# Patient Record
Sex: Female | Born: 1990 | Race: White | Hispanic: No | Marital: Single | Smoking: Former smoker
Health system: Southern US, Community
[De-identification: ages and names within clinical notes are randomized; demographics above are authoritative.]

## PROBLEM LIST (undated history)

## (undated) DIAGNOSIS — E162 Hypoglycemia, unspecified: Secondary | ICD-10-CM

## (undated) DIAGNOSIS — J302 Other seasonal allergic rhinitis: Secondary | ICD-10-CM

## (undated) DIAGNOSIS — L709 Acne, unspecified: Secondary | ICD-10-CM

## (undated) DIAGNOSIS — S73199A Other sprain of unspecified hip, initial encounter: Secondary | ICD-10-CM

## (undated) HISTORY — DX: Other seasonal allergic rhinitis: J30.2

## (undated) HISTORY — DX: Acne, unspecified: L70.9

## (undated) HISTORY — PX: WISDOM TOOTH EXTRACTION: SHX21

---
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
## 2011-06-30 ENCOUNTER — Ambulatory Visit (INDEPENDENT_AMBULATORY_CARE_PROVIDER_SITE_OTHER): Payer: 59 | Admitting: Family Medicine

## 2011-06-30 ENCOUNTER — Encounter: Payer: Self-pay | Admitting: Family Medicine

## 2011-06-30 VITALS — BP 108/70 | HR 80 | Temp 98.1°F | Ht 66.5 in | Wt 165.0 lb

## 2011-06-30 DIAGNOSIS — J019 Acute sinusitis, unspecified: Secondary | ICD-10-CM

## 2011-06-30 DIAGNOSIS — J309 Allergic rhinitis, unspecified: Secondary | ICD-10-CM

## 2011-06-30 MED ORDER — FLUTICASONE PROPIONATE 50 MCG/ACT NA SUSP: 2.0000 | Freq: Every day | NASAL | Status: DC

## 2011-06-30 MED ORDER — AMOXICILLIN-POT CLAVULANATE 875-125 MG PO TABS: 1.0000 | ORAL_TABLET | Freq: Two times a day (BID) | ORAL | Status: AC

## 2011-06-30 NOTE — Progress Notes (Signed)
Chief Complaint  Patient presents with  . Sinus problems    sinus problems since febuary and seems fine if shes on meds but once done comes right back   HPI: She has been sick since early February.  Saw a doctor in [place] 3 times. Initially diagnosed with sinusitis, then URI, then had pneumonia (3-4 weeks ago).  Has had a steroid pack, took Mucinex, and antibiotics that began with L and C (??levaquin, clarithromycin--she thinks so).  Also treated with a hydrocodone syrup for cough.  Fevers resolved after 2 days, but cough has recurred.  Currently complaining of stuffy nose, pressure between her eyes and in both cheeks.  Chest congestion.  Nasal mucus is green initially, clears throughout the day (still slightly green).  Phlegm is thick, green/yellow.  Cough is worse when she first wakes up, and when she lays down to go to sleep.    Has an inhaler, hasn't used it--doesn't feel like the same chest tightness like with asthma.  Zyrtec has always worked well for her allergies in the past, or Zyrtec D if worse.  Hasn't had improvement with this regimen recently.  Past Medical History  Diagnosis Date  . Asthma   . Seasonal allergies   . Acne     Past Surgical History  Procedure Date  . Wisdom tooth extraction     History   Social History  . Marital Status: Single    Spouse Name: N/A    Number of Children: N/A  . Years of Education: N/A   Occupational History  . student    Social History Main Topics  . Smoking status: Never Smoker   . Smokeless tobacco: Never Used  . Alcohol Use: No  . Drug Use: No  . Sexually Active: Yes -- Female partner(s)    Birth Control/ Protection: Pill   Other Topics Concern  . 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 Hypertension Mother   . Heart disease Father 79    MI; s/p CABG  . Diabetes Father     resolved after weight loss  . Cancer Paternal  Grandmother     lung   Current Outpatient Prescriptions on File Prior to Visit  Medication Sig Dispense Refill  . cetirizine (ZYRTEC) 10 MG tablet Take 10 mg by mouth daily.      . drospirenone-ethinyl estradiol (YAZ,GIANVI,LORYNA) 3-0.02 MG tablet Take 1 tablet by mouth daily.      Marland Kitchen albuterol (PROVENTIL HFA;VENTOLIN HFA) 108 (90 BASE) MCG/ACT inhaler Inhale 2 puffs into the lungs every 6 (six) hours as needed.      . fluticasone (FLONASE) 50 MCG/ACT nasal spray Place 2 sprays into the nose daily.  16 g  6   No Known Allergies  ROS:  Denies fevers.  Denies headaches (just sinus pressure).  Denies nausea, vomiting, diarrhea, skin rashes. R shoulder pain (plans upcoming surgery).  Denies exertional chest pain or shortness of breath (just related to spells of coughing)  PHYSICAL EXAM: BP 108/70  Pulse 80  Temp(Src) 98.1 F (36.7 C) (Oral)  Ht 5' 6.5" (1.689 m)  Wt 165 lb (74.844 kg)  BMI 26.23 kg/m2 Well developed female, in no distress. Frequent sniffling, sounds congested.  No coughing during visit. HEENT:  PERRL, EOMI, conjunctiva clear.  TM's and EAC's normal. Nasal mucosa mild-mod edematous, no erythema, clear-white mucus.  Sinuses tender x 4.  OP with cobblestoning posteriorly Neck: no lymphadenopathy or mass Heart: regular rate and rhythm without murmur Lungs: clear no wheezing, no wheeze with forced expiration. Skin: no rash Psych: normal mood, affect, hygiene and grooming  ASSESSMENT/PLAN: 1. Allergic rhinitis, cause unspecified  fluticasone (FLONASE) 50 MCG/ACT nasal spray  2. Acute sinusitis  amoxicillin-clavulanate (AUGMENTIN) 875-125 MG per tablet   Allergies, +/- low grade sinus infection that has been inadequately treated (given recurrence of discoloration as soon as ABX are stopped)--most likely related to length of treatment.  Sinuses rinses--Neti-pot or sinus rinse kit (1-2x daily) Continue Zyrtec, decongestant (either separate sudafed, zyrtec D or mucinex  D) Add Mucinex (plain, D or DM--has cough suppressant in it) Use your inhaler when having spasms of dry cough Add Flonase--2 sprays each nostril once daily.  Gentle sniffs only; instructed on proper use  Try above measures for 2-3 days.  If mucus remains thick and discolored, and no improvement in your symptoms, then start the antibiotic.  Use the antibiotic for a full 3 weeks.  You will need to use condoms as your Yaz may not protect you from pregnancy while on antibiotic.  Discussed s/sx of yeast infection, and to use OTC Monistat if they develop (hasn't had in the past with ABX)  Return in 2 weeks if not better.  25 minute visit, >1/2 counseling

## 2011-06-30 NOTE — Patient Instructions (Signed)
Allergies, +/- low grade sinus infection that has been inadequately treated (given recurrence of discoloration as soon as antibiotics are stopped)--most likely related to length of treatment.  Sinuses rinses--Neti-pot or sinus rinse kit (1-2x daily) Continue Zyrtec, decongestant (either separate sudafed, zyrtec D or mucinex D) Add Mucinex (plain, D or DM--has cough suppressant in it) Use your inhaler when having spasms of dry cough Add Flonase--2 sprays each nostril once daily.  Gentle sniffs only  Try above measures for 2-3 days.  If mucus remains thick and discolored, and no improvement in your symptoms, then start the antibiotic.  Use the antibiotic for a full 3 weeks.  You will need to use condoms as your Yaz may not protect you from pregnancy while on antibiotic.    Return in 2 weeks if not better

## 2011-07-12 HISTORY — PX: ROTATOR CUFF REPAIR: SHX139

## 2011-09-09 ENCOUNTER — Other Ambulatory Visit: Payer: 59

## 2011-09-10 ENCOUNTER — Other Ambulatory Visit: Payer: 59

## 2011-09-10 DIAGNOSIS — Z130 Encounter for screening for diseases of the blood and blood-forming organs and certain disorders involving the immune mechanism: Secondary | ICD-10-CM

## 2011-09-11 LAB — SICKLE CELL SCREEN: Sickle Cell Screen: NEGATIVE

## 2012-09-06 ENCOUNTER — Encounter: Payer: Self-pay | Admitting: Family Medicine

## 2012-09-06 ENCOUNTER — Ambulatory Visit (INDEPENDENT_AMBULATORY_CARE_PROVIDER_SITE_OTHER): Payer: 59 | Admitting: Family Medicine

## 2012-09-06 VITALS — BP 108/60 | HR 84 | Temp 98.2°F | Ht 66.0 in | Wt 170.0 lb

## 2012-09-06 DIAGNOSIS — R300 Dysuria: Secondary | ICD-10-CM

## 2012-09-06 DIAGNOSIS — N390 Urinary tract infection, site not specified: Secondary | ICD-10-CM

## 2012-09-06 DIAGNOSIS — J309 Allergic rhinitis, unspecified: Secondary | ICD-10-CM

## 2012-09-06 DIAGNOSIS — J069 Acute upper respiratory infection, unspecified: Secondary | ICD-10-CM

## 2012-09-06 LAB — POCT URINALYSIS DIPSTICK
Bilirubin, UA: NEGATIVE
Glucose, UA: NEGATIVE
Ketones, UA: NEGATIVE
Nitrite, UA: NEGATIVE
Spec Grav, UA: 1.02
Urobilinogen, UA: NEGATIVE
pH, UA: 5

## 2012-09-06 MED ORDER — SULFAMETHOXAZOLE-TMP DS 800-160 MG PO TABS: 1.0000 | ORAL_TABLET | Freq: Two times a day (BID) | ORAL | Status: DC

## 2012-09-06 NOTE — Progress Notes (Signed)
Chief Complaint  Patient presents with  . Cough    and chest congestion, coughing up "green crud" x 1 week.   . Urinary Frequency    and urgency as well as burning since last night, worsened this morning.    She started having dysuria, urgency and frequency of small amounts, starting last night.  Denies fever, chills, back pain, nausea or vomiting.  H/o UTI's when much smaller, none recent.  Also complaining of chest congestion, and coughing up green phlegm.  Slight runny nose in the mornings, clear.  Denies sinus pain.  Some scratchy throat.  Denies ear pain.  Sometimes feels some rattling in her chest with breathing, but denies any chest tightness or wheezing. No shortness of breath.  Hasn't needed to use inhaler.  She has h/o allergies, uses zyrtec daily.  She thought it was just allergies until it moved into her chest, about 3 days ago.  Has been sick for a week in total.  Denies sick contacts.    Past Medical History  Diagnosis Date  . Asthma   . Seasonal allergies   . Acne    Past Medical History  Diagnosis Date  . Asthma   . Seasonal allergies   . Acne    History   Social History  . Marital Status: Single    Spouse Name: N/A    Number of Children: N/A  . Years of Education: N/A   Occupational History  . student    Social History Main Topics  . Smoking status: Never Smoker   . Smokeless tobacco: Never Used  . Alcohol Use: Yes     Comment: 3-5 drinks every other week.  . Drug Use: No  . Sexually Active: Yes -- Female partner(s)    Birth Control/ Protection: Pill   Other Topics Concern  . 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  Slight recent diarrhea.  No vaginal discharge, periods are normal on OCP's.  No rashes, bleeding, bruising.    PHYSICAL EXAM:  BP 108/60  Pulse 84  Temp(Src) 98.2 F (36.8 C) (Oral)  Ht 5' 6"  (1.676 m)  Wt 170 lb (77.111 kg)  BMI 27.45 kg/m2  LMP 08/31/2012 Well developed, pleasant female in no distress HEENT:  PERRL, EOMI, conjunctiva clear.  TM's and EAC's normal.  Nasal mucosa mildly edematous, no erythema,  Edema L>R with clear mucus.  Sinuses nontender.  OP clear Neck: no lymphadenopathy, thyromegaly or mass Heart: regular rate and rhythm without murmur Lungs: clear bilaterally Abdomen: nontender, no mass. No suprapubic tenderness Back: no CVA tenderness Skin: no rash Psych: normal mood, affect, hygiene and grooming Neuro: alert and oriented.  Cranial nerves intact. Normal strength, gait   Urine dip:  2+ blood, 2+ protein, 2+ leuks She is on her period; wearing tampon   ASSESSMENT/PLAN:  UTI (urinary tract infection) - treat with Septra DS;  no h/o allergy.  This should also cover her URI symptoms if bacterial - Plan: sulfamethoxazole-trimethoprim (BACTRIM DS) 800-160 MG per tablet, Urine culture  Burning with urination - Plan: POCT Urinalysis Dipstick  Acute upper respiratory infections of unspecified site - possible early sinusitis vs bronchitis in a pt with chronic allergies.  cover with Septra DS - Plan: sulfamethoxazole-trimethoprim (BACTRIM DS) 800-160 MG per tablet  Allergic rhinitis, cause unspecified - continue zyrtec   Drink plenty of fluids Start Septra DS BID x 7 days If ongoing respiratory symptoms after a week (improved, but persistent symptoms), then call to have antibiotics  extended) Try using guaifenesin (mucinex or robitussin)--this is an expectorant that will keep the phlegm loose/thin.  Consider DM version (dextromethorphan) of robitussin or mucinex, vs separate delsym syrup if having significant cough--this is a cough suppressant.  Return if having fevers, vomiting, flank pain, or worsening cough with shortness of breath or other problems.  Make sure to use back-up contraception (ie condoms) as the antibiotics lessen the effectiveness of birth control pills

## 2012-09-06 NOTE — Patient Instructions (Addendum)
Drink plenty of fluids Start Septra DS BID x 7 days If ongoing respiratory symptoms after a week (improved, but persistent symptoms), then call to have antibiotics extended) Try using guaifenesin (mucinex or robitussin)--this is an expectorant that will keep the phlegm loose/thin.  Consider DM version (dextromethorphan) of robitussin or mucinex, vs separate delsym syrup if having significant cough--this is a cough suppressant.  Return if having fevers, vomiting, flank pain, or worsening cough with shortness of breath or other problems.  Make sure to use back-up contraception (ie condoms) as the antibiotics lessen the effectiveness of birth control pills  Call in 2-3 days if urinary symptoms aren't significantly improved. Call in 1 week if ongoing respiratory symptoms

## 2012-10-13 ENCOUNTER — Telehealth: Payer: Self-pay | Admitting: Internal Medicine

## 2012-10-13 DIAGNOSIS — J45909 Unspecified asthma, uncomplicated: Secondary | ICD-10-CM

## 2012-10-13 MED ORDER — ALBUTEROL SULFATE HFA 108 (90 BASE) MCG/ACT IN AERS: 2.0000 | INHALATION_SPRAY | Freq: Four times a day (QID) | RESPIRATORY_TRACT | Status: DC | PRN

## 2012-10-13 NOTE — Telephone Encounter (Signed)
done

## 2012-10-13 NOTE — Telephone Encounter (Signed)
This was sent to me. Is this okay to refill?

## 2012-10-13 NOTE — Telephone Encounter (Signed)
[facility]

## 2013-03-25 ENCOUNTER — Other Ambulatory Visit: Payer: Self-pay | Admitting: Family Medicine

## 2013-03-25 NOTE — Telephone Encounter (Signed)
Deny. She has only been seen for acute visits.  She needs OV (and likely CPE at some point) for asthma and allergies, for refills

## 2013-03-25 NOTE — Telephone Encounter (Signed)
Is this okay to refill? She was last seen 7/ 2014. CLS

## 2013-04-01 NOTE — Telephone Encounter (Signed)
I called the patient and left her a voicemail about setting up an appointment in order to receive refills on her medication per Dr. Tomi Bamberger. CLS

## 2013-04-04 ENCOUNTER — Other Ambulatory Visit: Payer: Self-pay | Admitting: Family Medicine

## 2013-04-04 DIAGNOSIS — J45909 Unspecified asthma, uncomplicated: Secondary | ICD-10-CM

## 2013-04-04 NOTE — Telephone Encounter (Signed)
This was denied 2/13 and Andria Frames left message for her to set up appt--see prior refill request.  No OV is scheduled

## 2013-04-04 NOTE — Telephone Encounter (Signed)
Wants refill on albuterol inhaler   Sent to [facility]

## 2013-04-05 ENCOUNTER — Other Ambulatory Visit: Payer: Self-pay | Admitting: Family Medicine

## 2013-04-06 ENCOUNTER — Telehealth: Payer: Self-pay | Admitting: Family Medicine

## 2013-04-06 DIAGNOSIS — J45909 Unspecified asthma, uncomplicated: Secondary | ICD-10-CM

## 2013-04-06 MED ORDER — ALBUTEROL SULFATE HFA 108 (90 BASE) MCG/ACT IN AERS: 2.0000 | INHALATION_SPRAY | Freq: Four times a day (QID) | RESPIRATORY_TRACT | Status: DC | PRN

## 2013-04-06 NOTE — Telephone Encounter (Signed)
Ok for 1 albuterol, no add'l refill.  Schedule for either CPE or med check for May (CPE recommended, up to pt), when she is back in town

## 2013-04-06 NOTE — Telephone Encounter (Signed)
Left message for patient letting her know that Dr.Knapp ok'd for #1 on albuterol inhaler-sent rx to [facility] in [place], [place]. Asked patient to please call and schedule either a CPE or med check in May when she returns. Asked her to please call ASAP as appt's fill up quickly.

## 2013-04-06 NOTE — Telephone Encounter (Signed)
Pt called and stated that she received a phone call telling her she needs an appt. Pt states that she is out of state going to school and will not be back until May. 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 Please fill medication or advise pt. [facility] [place], [place]. Pt can be reached at [phone].

## 2013-04-06 NOTE — Telephone Encounter (Signed)
Called and left another message asking patient to call and schedule med check for asthma and allergies with Dr.Knapp. Andria Frames already did this on 04/01/13.

## 2013-06-30 ENCOUNTER — Encounter: Payer: 59 | Admitting: Family Medicine

## 2013-07-13 ENCOUNTER — Ambulatory Visit (INDEPENDENT_AMBULATORY_CARE_PROVIDER_SITE_OTHER): Payer: 59 | Admitting: Family Medicine

## 2013-07-13 ENCOUNTER — Encounter: Payer: Self-pay | Admitting: Family Medicine

## 2013-07-13 VITALS — BP 110/60 | HR 68 | Temp 98.5°F | Ht 66.0 in | Wt 175.0 lb

## 2013-07-13 DIAGNOSIS — J45990 Exercise induced bronchospasm: Secondary | ICD-10-CM

## 2013-07-13 DIAGNOSIS — J45909 Unspecified asthma, uncomplicated: Secondary | ICD-10-CM

## 2013-07-13 DIAGNOSIS — J069 Acute upper respiratory infection, unspecified: Secondary | ICD-10-CM

## 2013-07-13 DIAGNOSIS — J309 Allergic rhinitis, unspecified: Secondary | ICD-10-CM

## 2013-07-13 MED ORDER — ALBUTEROL SULFATE HFA 108 (90 BASE) MCG/ACT IN AERS: 2.0000 | INHALATION_SPRAY | Freq: Four times a day (QID) | RESPIRATORY_TRACT | Status: DC | PRN

## 2013-07-13 NOTE — Patient Instructions (Addendum)
We discussed singulair as an option to treat allergies/asthma--recommended if you are going to be getting very regular physical activity that requires use of preventative albuterol, given that you also have year-round allergies.  Let me know if you would like to try this and we can send in prescription.  You would need to take this medication every day.  Please try and get [place] copies of your immunization records, so that we know when you are due for your next tetanus, as well as any other recommended vaccines.   Yearly flu shots are recommended.  Drink plenty of fluids. Use guaifenesin (expectorant, found in Mucinex and robitussin). If you cough is more frequent, you can use the DM version of these medications--dextromethorphan is a cough suppressant.  This is also found separately in Delsym syrup.

## 2013-07-13 NOTE — Progress Notes (Signed)
Chief Complaint  Patient presents with  . Asthma    med check.   . Cough    cough that started yesterday, coughing up yellowish/greenish mucus.    Yesterday she woke up with cough, and today the cough is productive of yellow-green "chunks".  Denies any runny nose, sneezing (not different than her typical allergies), sinus pain/pressure, headaches, fever.  No sick contacts.  She returned from [place] on Monday (plane travel). Denies shortness of breath, wheezing.  Denies ear pain.  Asthma:  She used albuterol prior to playing softball, which worked well.  Didn't need to use rescue inhaler if she used it prior to exercise.  Only needs rescue inhaler related to running or hiking.  She used it over the weekend while hiking in [place].  Doesn't usually need it related to illnesses.  Allergies:  Well controlled with Zyrtec, although still has some bad days.  Symptoms are worse in the Spring, but usually has some allergies all year long; takes zyrtec daily all year.  No immunizations are on file.  Hasn't been getting flu shots (just once, and that was the year she got the flu). She has had Gardisil series, and sees a GYN yearly, scheduled for next week.  Past Medical History  Diagnosis Date  . Asthma   . Seasonal allergies   . Acne    Past Surgical History  Procedure Laterality Date  . Wisdom tooth extraction    . Rotator cuff repair Right 07/2011    as also bicep tendon surgery   History   Social History  . Marital Status: Single    Spouse Name: N/A    Number of Children: N/A  . Years of Education: N/A   Occupational History  . Not on file.   Social History Main Topics  . Smoking status: Never Smoker   . Smokeless tobacco: Never Used  . Alcohol Use: Yes     Comment: 3-5 drinks every other week  . Drug Use: No  . Sexual Activity: Yes    Partners: Male    Birth Control/ Protection: Pill   Other Topics Concern  . Not on file   Social History Narrative   Graduated 07/2013 from  [employer] in [place].  Hoping to go to [employer] in the Fall. Just got engaged   Outpatient Encounter Prescriptions as of 07/13/2013  Medication Sig Note  . cetirizine (ZYRTEC) 10 MG tablet Take 10 mg by mouth daily.   . drospirenone-ethinyl estradiol (YAZ,GIANVI,LORYNA) 3-0.02 MG tablet Take 1 tablet by mouth daily.   . Multiple Vitamins-Minerals (MULTIVITAMIN WITH MINERALS) tablet Take 1 tablet by mouth daily.   Marland Kitchen albuterol (PROVENTIL HFA;VENTOLIN HFA) 108 (90 BASE) MCG/ACT inhaler Inhale 2 puffs into the lungs every 6 (six) hours as needed for wheezing. 07/13/2013: Uses before playing softball (no longer doing); uses for rescue prn hiking/running, not with illnesses.   . [DISCONTINUED] sulfamethoxazole-trimethoprim (BACTRIM DS) 800-160 MG per tablet Take 1 tablet by mouth 2 (two) times daily.    No Known Allergies  ROS:  Denies fevers, chills, headaches, dizziness, chest pain, palpitations, shortness of breath, nausea, vomiting, bowel changes, bleeding/bruising, rash, joint pains or other complaints.  See HPI. + cough  PHYSICAL EXAM: BP 110/60  Pulse 68  Temp(Src) 98.5 F (36.9 C) (Oral)  Ht 5' 6"  (1.676 m)  Wt 175 lb (79.379 kg)  BMI 28.26 kg/m2  LMP 07/12/2013 Well developed, pleasant female in no distress HEENT:  PERRL, EOMI, conjunctiva clear. Nasal mucosa is  mild-moderately edematous, no erythema.  Clear mucus on right, white on left. Sinuses are nontender.  OP is clear Neck: no lymphadenopathy, thyromegaly or mass Heart: regular rate and rhythm, no murmur Lungs: clear bilaterally; good air movement. No wheezes, rales, ronchi Peak flow 450/485/470 (estimated goal is 460 based on height/age) Extremities: no edema Skin: no rash/lesions Abdomen: soft, nontender, no mass  ASSESSMENT/PLAN:  Asthma - Plan: albuterol (PROVENTIL HFA;VENTOLIN HFA) 108 (90 BASE) MCG/ACT inhaler  Exercise-induced asthma  Allergic rhinitis, cause unspecified  Acute upper respiratory  infections of unspecified site - supportive measures reviewed.  suspect viral URI   URI-- Drink plenty of fluids. Use guaifenesin (expectorant, found in Mucinex and robitussin). If you cough is more frequent, you can use the DM version of these medications--dextromethorphan is a cough suppressant.  This is also found separately in Delsym syrup.  Asthma--exercise induced.  Reviewed use of inhaler prior to exercise (and prn).  Discussed singulair as an option to treat allergies/asthma--recommended if she is going to be getting very regular physical activity that requires use of preventative albuterol most days, given that she also has year-round allergies that are suboptimally controlled in the Spring. She elects to hold off on Singulair at this time (as she is doing fine now, except for the URI, and NOT having any wheezing now)  She was advised to try and get [place] copies of immunization records, ?if due for Tdap. Yearly flu shots are recommended

## 2014-12-27 ENCOUNTER — Ambulatory Visit (INDEPENDENT_AMBULATORY_CARE_PROVIDER_SITE_OTHER): Payer: BLUE CROSS/BLUE SHIELD | Admitting: Family Medicine

## 2014-12-27 ENCOUNTER — Encounter: Payer: Self-pay | Admitting: Family Medicine

## 2014-12-27 VITALS — BP 110/70 | HR 64 | Temp 98.0°F | Wt 187.8 lb

## 2014-12-27 DIAGNOSIS — H5712 Ocular pain, left eye: Secondary | ICD-10-CM

## 2014-12-27 DIAGNOSIS — H578 Other specified disorders of eye and adnexa: Secondary | ICD-10-CM

## 2014-12-27 DIAGNOSIS — H5789 Other specified disorders of eye and adnexa: Secondary | ICD-10-CM

## 2014-12-27 NOTE — Progress Notes (Signed)
   Subjective:    Patient ID: Paula Cooley, female    DOB: 1990-05-01, 24 y.o.   MRN: [id number]  HPI Chief Complaint  Patient presents with  . eye swollen    eye swollen, red- blurry but feels better to keep it shut. painful but also painful to put pressure or touch. feels like something is in her eye. irriated and burning   She is here with a 2 day history of left eye pain, redness, cloudy vision, photophobia, and tenderness. Also reports sensation of foreign body. Denies injury. No known foreign body.  Denies fever, chills.  [facility] [place]- he is Optomotrist. Does not have an ophthalmologist.  Wears contact lenses, disposable. States she took her lens out yesterday.   Reviewed allergies, medications, past medical and social history  Review of Systems Pertinent positives and negatives in the history of present illness.    Objective:   Physical Exam  Constitutional: She appears well-developed and well-nourished. She appears distressed.  Eyes: EOM are normal. Pupils are equal, round, and reactive to light. Left eye exhibits discharge.  Lids swollen, conjunctiva red, difficult to assess due to severe photophobia and tenderness. Clear drainage present. Unable to perform visual acuity.    BP 110/70 mmHg  Pulse 64  Temp(Src) 98 F (36.7 C) (Oral)  Wt 187 lb 12.8 oz (85.186 kg)     Assessment & Plan:  Eye pain, left - Plan: Ambulatory referral to Ophthalmology  Redness of eye, left - Plan: Ambulatory referral to Ophthalmology  Possible iritis. Difficult to assess patient due to sensitivity to light and tenderness. Dr. Redmond School also examined patient and agreed that referral to ophthalmologist for further evaluation and treatment would be best. No obvious injury or foreign body

## 2015-08-11 LAB — HM PAP SMEAR: HM Pap smear: HIGH

## 2015-09-07 ENCOUNTER — Encounter: Payer: Self-pay | Admitting: Family Medicine

## 2015-09-26 ENCOUNTER — Encounter: Payer: Self-pay | Admitting: *Deleted

## 2015-10-29 ENCOUNTER — Other Ambulatory Visit: Payer: Self-pay | Admitting: Orthopedic Surgery

## 2015-10-29 DIAGNOSIS — M25511 Pain in right shoulder: Secondary | ICD-10-CM

## 2015-11-07 ENCOUNTER — Other Ambulatory Visit: Payer: Self-pay

## 2015-11-19 ENCOUNTER — Ambulatory Visit (INDEPENDENT_AMBULATORY_CARE_PROVIDER_SITE_OTHER): Payer: BLUE CROSS/BLUE SHIELD | Admitting: Orthopedic Surgery

## 2015-11-19 DIAGNOSIS — M25511 Pain in right shoulder: Secondary | ICD-10-CM

## 2015-11-21 ENCOUNTER — Other Ambulatory Visit: Payer: Self-pay | Admitting: Orthopedic Surgery

## 2015-11-26 ENCOUNTER — Ambulatory Visit (INDEPENDENT_AMBULATORY_CARE_PROVIDER_SITE_OTHER): Payer: BLUE CROSS/BLUE SHIELD | Admitting: Orthopedic Surgery

## 2015-12-12 ENCOUNTER — Inpatient Hospital Stay (HOSPITAL_COMMUNITY): Admission: RE | Admit: 2015-12-12 | Payer: BLUE CROSS/BLUE SHIELD | Source: Ambulatory Visit

## 2015-12-25 NOTE — Pre-Procedure Instructions (Signed)
    SUSEN HASKEW  12/25/2015      [id number] [facility]Phone: [phone]: [phone]   Your procedure is scheduled on Tuesday, November 21.  [facility]                 Your surgery or procedure is scheduled for 1:30 AM                Call this number if you have problems the morning of surgery: [phone]                For any other questions, please call [phone], Monday - Friday 8 AM - 4 PM.    Remember:  Do not eat food or drink liquids after midnight Monday, November 20.  Take these medicines the morning of surgery with A SIP OF WATER: cetirizine (ZYRTEC), Norgestimate-Ethinyl Estradiol Triphasic (TRI-SPRINTEC).                Stop taking Aspirin, Aspirin Products, Effient and herbal medications, Vitamins.  Do not take any NSAIDS ie:  Ibuprofen, Advil, Naproxen.   Do not wear jewelry, make-up or nail polish.   Do not wear lotions, powders, or perfumes, or deodorant.   Do not shave 48 hours prior to surgery.     Do not bring valuables to the hospital.   [facility] responsible for any belongings or valuables.  Contacts, dentures or bridgework may not be worn into surgery.  Leave your suitcase in the car.  After surgery it may be brought to your room.  For patients admitted to the hospital, discharge time will be determined by your treatment team.  Patients discharged the day of surgery will not be allowed to drive home.   Special instructions: -  Please read over the following fact sheets that you were given.

## 2015-12-26 ENCOUNTER — Inpatient Hospital Stay (INDEPENDENT_AMBULATORY_CARE_PROVIDER_SITE_OTHER): Payer: BLUE CROSS/BLUE SHIELD | Admitting: Orthopedic Surgery

## 2015-12-26 ENCOUNTER — Encounter (HOSPITAL_COMMUNITY)
Admission: RE | Admit: 2015-12-26 | Discharge: 2015-12-26 | Disposition: A | Discharge status: Home / Self Care | Payer: BLUE CROSS/BLUE SHIELD | Source: Ambulatory Visit | Attending: Orthopedic Surgery | Admitting: Orthopedic Surgery

## 2015-12-26 ENCOUNTER — Encounter (HOSPITAL_COMMUNITY): Payer: Self-pay

## 2015-12-26 DIAGNOSIS — Z01812 Encounter for preprocedural laboratory examination: Secondary | ICD-10-CM | POA: Insufficient documentation

## 2015-12-26 HISTORY — DX: Other sprain of unspecified hip, initial encounter: S73.199A

## 2015-12-26 HISTORY — DX: Hypoglycemia, unspecified: E16.2

## 2015-12-26 LAB — CBC
HEMATOCRIT: 40.9 % (ref 36.0–46.0)
HEMOGLOBIN: 14 g/dL (ref 12.0–15.0)
MCH: 31 pg (ref 26.0–34.0)
MCHC: 34.2 g/dL (ref 30.0–36.0)
MCV: 90.7 fL (ref 78.0–100.0)
Platelets: 244 10*3/uL (ref 150–400)
RBC: 4.51 MIL/uL (ref 3.87–5.11)
RDW: 12.7 % (ref 11.5–15.5)
WBC: 6.6 10*3/uL (ref 4.0–10.5)

## 2015-12-26 LAB — HCG, SERUM, QUALITATIVE: PREG SERUM: NEGATIVE

## 2015-12-26 LAB — BASIC METABOLIC PANEL
ANION GAP: 12 (ref 5–15)
BUN: 7 mg/dL (ref 6–20)
CALCIUM: 9.5 mg/dL (ref 8.9–10.3)
CO2: 20 mmol/L — ABNORMAL LOW (ref 22–32)
Chloride: 107 mmol/L (ref 101–111)
Creatinine, Ser: 0.84 mg/dL (ref 0.44–1.00)
Glucose, Bld: 98 mg/dL (ref 65–99)
POTASSIUM: 3.6 mmol/L (ref 3.5–5.1)
SODIUM: 139 mmol/L (ref 135–145)

## 2015-12-26 NOTE — Progress Notes (Signed)
Pt. Was started on Phentermine several months ago by Dr. Stann Mainland with [facility], , dropped from 201 lbs. To 171lbs. Today.  Pt. Told to stop Phentermine now. Pt. Denies chest concerns.

## 2015-12-26 NOTE — Pre-Procedure Instructions (Signed)
Paula Cooley  12/26/2015      [id number]Phone: [phone]: [phone]   Your procedure is scheduled on 01/01/2016  [facility]  Call this number if you have problems the morning of surgery:  [phone]   Remember:  Do not eat food or drink liquids after midnight.  On Monday night   Take these medicines the morning of surgery with A SIP OF WATER : birthcontrol pill, zyrtec & may take 2 glucose tabs if needed, also take plain Tylenol if needed  STOP NOW:  PHENTERMINE & ALEVE    Do not wear jewelry, make-up or nail polish.  Do not wear lotions, powders, or perfumes, or deoderant.  Do not shave 48 hours prior to surgery.    Do not bring valuables to the hospital.  [facility] any belongings or [facility]uables.  Contacts, dentures or bridgework may not be worn into surgery.  Leave your suitcase in the car.  After surgery it may be brought to your room.  For patients admitted to the hospital, discharge time will be determined by your treatment team.  Patients discharged the day of surgery will not be allowed to drive home.   Name and phone number of your driver:   Friend  Special instructions: Special Instructions: [facility]- Preparing for Surgery  Before surgery, you can play an important role.  Because skin is not sterile, your skin needs to be as free of germs as possible.  You can reduce the number of germs on you skin by washing with CHG (chlorahexidine gluconate) soap before surgery.  CHG is an antiseptic cleaner which kills germs and bonds with the skin to continue killing germs even after washing.  Please DO NOT use if you have an allergy to CHG or antibacterial soaps.  If your skin becomes reddened/irritated stop using the CHG and inform your nurse when you arrive at Short Stay.  Do not shave (including legs  and underarms) for at least 48 hours prior to the first CHG shower.  You may shave your face.  Please follow these instructions carefully:   1.  Shower with CHG Soap the night before surgery and the  morning of Surgery.  2.  If you choose to wash your hair, wash your hair first as usual with your  normal shampoo.  3.  After you shampoo, rinse your hair and body thoroughly to remove the  Shampoo.  4.  Use CHG as you would any other liquid soap.  You can apply chg directly to the skin and wash gently with scrungie or a clean washcloth.  5.  Apply the CHG Soap to your body ONLY FROM THE NECK DOWN.    Do not use on open wounds or open sores.  Avoid contact with your eyes, ears, mouth and genitals (private parts).  Wash genitals (private parts)   with your normal soap.  6.  Wash thoroughly, paying special attention to the area where your surgery will be performed.  7.  Thoroughly rinse your body with warm water from the neck down.  8.  DO NOT shower/wash with your normal soap after using and rinsing off   the CHG Soap.  9.  Pat yourself dry with a clean towel.            10.  Wear clean pajamas.  11.  Place clean sheets on your bed the night of your first shower and do not sleep with pets.  Day of Surgery  Do not apply any lotions/deodorants the morning of surgery.  Please wear clean clothes to the hospital/surgery center.    Please read over the following fact sheets that you were given. Pain Booklet, Coughing and Deep Breathing and Surgical Site Infection Prevention

## 2015-12-31 NOTE — H&P (Signed)
Paula Cooley is an 25 y.o. female.   Chief Complaint: Right shoulder pain HPI: Paula Cooley is a 25 year old female with right shoulder pain.  She is an avid athlete and wishes to continue to dissipate and athletics.  She had shoulder surgery several years ago which was a labral debridement.  She has progressed to the point where she has significant mechanical symptoms in the right shoulder with overhead activity.  She would like to continue to be able to participate in sports.  Denies any frank shoulder instability but does report shoulder pain with activity such as overhead throwing.  Past Medical History:  Diagnosis Date  . Acetabular labrum tear    R shoulder   . Acne   . Asthma    exercise induced, no problems in quite a while, last use of inhaler- >one year  . Hypoglycemia    uses glucose tablets PRN  . Seasonal allergies     Past Surgical History:  Procedure Laterality Date  . ROTATOR CUFF REPAIR Right 07/2011   as also bicep tendon surgery  . WISDOM TOOTH EXTRACTION      Family History  Problem Relation Age of Onset  . Hypertension Mother   . Heart disease Father 17    MI; s/p CABG  . Diabetes Father     resolved after weight loss  . Cancer Paternal Grandmother     lung   Social History:  reports that she quit smoking about a year ago. She has never used smokeless tobacco. She reports that she drinks alcohol. She reports that she does not use drugs.  Allergies:  Allergies  Allergen Reactions  . No Known Allergies     No prescriptions prior to admission.    No results found for this or any previous visit (from the past 48 hour(s)). No results found.  Review of Systems  Musculoskeletal: Positive for joint pain.  All other systems reviewed and are negative.   Last menstrual period 12/06/2015. Physical Exam  Constitutional: She appears well-developed.  HENT:  Head: Normocephalic.  Neck: Normal range of motion.  Respiratory: Effort normal.  Neurological:  She is alert.  Skin: Skin is warm.  Psychiatric: She has a normal mood and affect.   examination of the right shoulder demonstrates full active and passive range of motion negative apprehension relocation testing good rotator cuff strength isolated infraspinatus supraspinatus and subscap muscle testing.  No masses lymph adenopathy or skin changes noted in the shoulder girdle region.  O'Brien's testing is positive on the right negative on the left speed's testing positive on the right negative on the left.  Assessment/Plan Impression is right shoulder symptomatic SLAP tear.  She underwent labral debridement 5 years ago.  She reports recurrent mechanical symptoms and MRI scan is consistent with superior labral tear.  Plan at this time is for shoulder arthroscopy with possible labral repair.  Risks and benefits discussed with the patient including not limited to infection or vessel damage incomplete healing and complete pain relief.  All questions answered  Anderson Malta, MD 12/31/2015, 6:42 PM

## 2016-01-01 ENCOUNTER — Encounter (HOSPITAL_COMMUNITY)
Admission: RE | Disposition: A | Discharge status: Home / Self Care | Payer: Self-pay | Source: Ambulatory Visit | Attending: Orthopedic Surgery

## 2016-01-01 ENCOUNTER — Ambulatory Visit (HOSPITAL_COMMUNITY): Payer: BLUE CROSS/BLUE SHIELD | Admitting: Emergency Medicine

## 2016-01-01 ENCOUNTER — Ambulatory Visit (HOSPITAL_COMMUNITY): Payer: BLUE CROSS/BLUE SHIELD | Admitting: Anesthesiology

## 2016-01-01 ENCOUNTER — Ambulatory Visit (HOSPITAL_COMMUNITY)
Admission: RE | Admit: 2016-01-01 | Discharge: 2016-01-01 | Disposition: A | Discharge status: Home / Self Care | Payer: BLUE CROSS/BLUE SHIELD | Source: Ambulatory Visit | Attending: Orthopedic Surgery | Admitting: Orthopedic Surgery

## 2016-01-01 ENCOUNTER — Encounter (HOSPITAL_COMMUNITY): Payer: Self-pay | Admitting: *Deleted

## 2016-01-01 DIAGNOSIS — Z793 Long term (current) use of hormonal contraceptives: Secondary | ICD-10-CM | POA: Insufficient documentation

## 2016-01-01 DIAGNOSIS — Z791 Long term (current) use of non-steroidal anti-inflammatories (NSAID): Secondary | ICD-10-CM | POA: Diagnosis not present

## 2016-01-01 DIAGNOSIS — M7521 Bicipital tendinitis, right shoulder: Secondary | ICD-10-CM | POA: Diagnosis not present

## 2016-01-01 DIAGNOSIS — M24111 Other articular cartilage disorders, right shoulder: Secondary | ICD-10-CM | POA: Insufficient documentation

## 2016-01-01 DIAGNOSIS — S43431A Superior glenoid labrum lesion of right shoulder, initial encounter: Secondary | ICD-10-CM | POA: Diagnosis not present

## 2016-01-01 DIAGNOSIS — J45990 Exercise induced bronchospasm: Secondary | ICD-10-CM | POA: Diagnosis not present

## 2016-01-01 DIAGNOSIS — Z87891 Personal history of nicotine dependence: Secondary | ICD-10-CM | POA: Insufficient documentation

## 2016-01-01 HISTORY — PX: SHOULDER ARTHROSCOPY WITH LABRAL REPAIR: SHX5691

## 2016-01-01 SURGERY — ARTHROSCOPY, SHOULDER, WITH GLENOID LABRUM REPAIR
Anesthesia: Regional | Site: Shoulder | Laterality: Right

## 2016-01-01 MED ORDER — FENTANYL CITRATE (PF) 100 MCG/2ML IJ SOLN
INTRAMUSCULAR | Status: DC | PRN
  Administered 2016-01-01: 100 ug via INTRAVENOUS

## 2016-01-01 MED ORDER — ONDANSETRON HCL 4 MG/2ML IJ SOLN: 4.0000 mg | Freq: Once | INTRAMUSCULAR | Status: DC | PRN

## 2016-01-01 MED ORDER — ONDANSETRON HCL 4 MG/2ML IJ SOLN
INTRAMUSCULAR | Status: DC | PRN
Start: 2016-01-01 — End: 2016-01-01
  Administered 2016-01-01: 4 mg via INTRAVENOUS

## 2016-01-01 MED ORDER — LIDOCAINE HCL (CARDIAC) 20 MG/ML IV SOLN
INTRAVENOUS | Status: DC | PRN
  Administered 2016-01-01: 60 mg via INTRAVENOUS

## 2016-01-01 MED ORDER — OXYCODONE HCL 5 MG/5ML PO SOLN: 5.0000 mg | Freq: Once | ORAL | Status: DC | PRN

## 2016-01-01 MED ORDER — GLYCOPYRROLATE 0.2 MG/ML IV SOSY
PREFILLED_SYRINGE | INTRAVENOUS | Status: AC
  Filled 2016-01-01: qty 3

## 2016-01-01 MED ORDER — MIDAZOLAM HCL 2 MG/2ML IJ SOLN
INTRAMUSCULAR | Status: AC
  Administered 2016-01-01: 2 mg via INTRAVENOUS
  Filled 2016-01-01: qty 2

## 2016-01-01 MED ORDER — SODIUM CHLORIDE 0.9 % IR SOLN
Status: DC | PRN
  Administered 2016-01-01 (×2): 3000 mL

## 2016-01-01 MED ORDER — CHLORHEXIDINE GLUCONATE 4 % EX LIQD: 60.0000 mL | Freq: Once | CUTANEOUS | Status: DC

## 2016-01-01 MED ORDER — LIDOCAINE 2% (20 MG/ML) 5 ML SYRINGE
INTRAMUSCULAR | Status: AC
  Filled 2016-01-01: qty 5

## 2016-01-01 MED ORDER — NEOSTIGMINE METHYLSULFATE 10 MG/10ML IV SOLN
INTRAVENOUS | Status: DC | PRN
  Administered 2016-01-01: 3 mg via INTRAVENOUS

## 2016-01-01 MED ORDER — FENTANYL CITRATE (PF) 100 MCG/2ML IJ SOLN
INTRAMUSCULAR | Status: AC
  Filled 2016-01-01: qty 2

## 2016-01-01 MED ORDER — FENTANYL CITRATE (PF) 100 MCG/2ML IJ SOLN
INTRAMUSCULAR | Status: AC
  Administered 2016-01-01: 100 ug via INTRAVENOUS
  Filled 2016-01-01: qty 2

## 2016-01-01 MED ORDER — 0.9 % SODIUM CHLORIDE (POUR BTL) OPTIME
TOPICAL | Status: DC | PRN
  Administered 2016-01-01: 1000 mL

## 2016-01-01 MED ORDER — PROPOFOL 10 MG/ML IV BOLUS
INTRAVENOUS | Status: DC | PRN
  Administered 2016-01-01: 150 mg via INTRAVENOUS

## 2016-01-01 MED ORDER — ONDANSETRON HCL 4 MG/2ML IJ SOLN
INTRAMUSCULAR | Status: AC
  Filled 2016-01-01: qty 2

## 2016-01-01 MED ORDER — HYDROMORPHONE HCL 1 MG/ML IJ SOLN: 0.2500 mg | INTRAMUSCULAR | Status: DC | PRN

## 2016-01-01 MED ORDER — CEFAZOLIN SODIUM-DEXTROSE 2-4 GM/100ML-% IV SOLN
2.0000 g | INTRAVENOUS | Status: AC
  Administered 2016-01-01: 2 g via INTRAVENOUS

## 2016-01-01 MED ORDER — GLYCOPYRROLATE 0.2 MG/ML IJ SOLN
INTRAMUSCULAR | Status: DC | PRN
  Administered 2016-01-01: 0.6 mg via INTRAVENOUS

## 2016-01-01 MED ORDER — ROCURONIUM BROMIDE 10 MG/ML (PF) SYRINGE
PREFILLED_SYRINGE | INTRAVENOUS | Status: AC
  Filled 2016-01-01: qty 10

## 2016-01-01 MED ORDER — LACTATED RINGERS IV SOLN
INTRAVENOUS | Status: DC
  Administered 2016-01-01: 14:00:00 via INTRAVENOUS

## 2016-01-01 MED ORDER — OXYCODONE HCL 5 MG PO TABS: 5.0000 mg | ORAL_TABLET | Freq: Once | ORAL | Status: DC | PRN

## 2016-01-01 MED ORDER — NEOSTIGMINE METHYLSULFATE 5 MG/5ML IV SOSY
PREFILLED_SYRINGE | INTRAVENOUS | Status: AC
  Filled 2016-01-01: qty 5

## 2016-01-01 MED ORDER — ROCURONIUM BROMIDE 100 MG/10ML IV SOLN
INTRAVENOUS | Status: DC | PRN
  Administered 2016-01-01: 50 mg via INTRAVENOUS

## 2016-01-01 MED ORDER — CEFAZOLIN SODIUM-DEXTROSE 2-4 GM/100ML-% IV SOLN
INTRAVENOUS | Status: AC
  Filled 2016-01-01: qty 100

## 2016-01-01 MED ORDER — LACTATED RINGERS IV SOLN
INTRAVENOUS | Status: DC | PRN
  Administered 2016-01-01 (×2): via INTRAVENOUS

## 2016-01-01 MED ORDER — MIDAZOLAM HCL 2 MG/2ML IJ SOLN
2.0000 mg | Freq: Once | INTRAMUSCULAR | Status: AC
  Administered 2016-01-01: 2 mg via INTRAVENOUS

## 2016-01-01 MED ORDER — FENTANYL CITRATE (PF) 100 MCG/2ML IJ SOLN
100.0000 ug | Freq: Once | INTRAMUSCULAR | Status: AC
  Administered 2016-01-01: 100 ug via INTRAVENOUS

## 2016-01-01 SURGICAL SUPPLY — 69 items
ANCH SUT SWLK 19.1X6.25 CLS (Anchor) ×1 IMPLANT
ANCHOR SUT SWIVELLOK BIO (Anchor) ×2 IMPLANT
BLADE CUTTER GATOR 3.5 (BLADE) ×3 IMPLANT
BLADE GREAT WHITE 4.2 (BLADE) ×2 IMPLANT
BLADE GREAT WHITE 4.2MM (BLADE) ×1
BLADE SURG 11 STRL SS (BLADE) ×3 IMPLANT
BUR OVAL 6.0 (BURR) ×3 IMPLANT
CANNULA 5.75X71 LONG (CANNULA) IMPLANT
CANNULA TWIST IN 8.25X7CM (CANNULA) ×3 IMPLANT
CLOSURE WOUND 1/2 X4 (GAUZE/BANDAGES/DRESSINGS) ×1
COVER SURGICAL LIGHT HANDLE (MISCELLANEOUS) ×3 IMPLANT
DRAPE INCISE IOBAN 66X45 STRL (DRAPES) ×6 IMPLANT
DRAPE STERI 35X30 U-POUCH (DRAPES) ×6 IMPLANT
DRSG TEGADERM 4X4.75 (GAUZE/BANDAGES/DRESSINGS) ×4 IMPLANT
DURAPREP 26ML APPLICATOR (WOUND CARE) ×3 IMPLANT
ELECT REM PT RETURN 9FT ADLT (ELECTROSURGICAL) ×3
ELECTRODE REM PT RTRN 9FT ADLT (ELECTROSURGICAL) ×1 IMPLANT
FIBERSTICK 2 (SUTURE) IMPLANT
FILTER STRAW FLUID ASPIR (MISCELLANEOUS) ×3 IMPLANT
GAUZE SPONGE 4X4 12PLY STRL (GAUZE/BANDAGES/DRESSINGS) ×3 IMPLANT
GAUZE XEROFORM 1X8 LF (GAUZE/BANDAGES/DRESSINGS) ×3 IMPLANT
GLOVE BIOGEL PI IND STRL 8 (GLOVE) ×1 IMPLANT
GLOVE BIOGEL PI INDICATOR 8 (GLOVE) ×2
GLOVE SURG ORTHO 8.0 STRL STRW (GLOVE) ×3 IMPLANT
GOWN STRL REUS W/ TWL LRG LVL3 (GOWN DISPOSABLE) ×3 IMPLANT
GOWN STRL REUS W/TWL LRG LVL3 (GOWN DISPOSABLE) ×9
KIT BASIN OR (CUSTOM PROCEDURE TRAY) ×3 IMPLANT
KIT DISPOSABLE PUSHLOCK 2.9MM (KITS) IMPLANT
KIT ROOM TURNOVER OR (KITS) ×3 IMPLANT
LASSO 90 CVE QUICKPAS (DISPOSABLE) ×3 IMPLANT
LASSO CRESCENT QUICKPASS (SUTURE) IMPLANT
MANIFOLD NEPTUNE II (INSTRUMENTS) ×3 IMPLANT
NDL HYPO 25X1 1.5 SAFETY (NEEDLE) ×1 IMPLANT
NDL SPNL 18GX3.5 QUINCKE PK (NEEDLE) ×1 IMPLANT
NDL SUT 6 .5 CRC .975X.05 MAYO (NEEDLE) ×1 IMPLANT
NEEDLE HYPO 25X1 1.5 SAFETY (NEEDLE) ×3 IMPLANT
NEEDLE MAYO TAPER (NEEDLE) ×3
NEEDLE SPNL 18GX3.5 QUINCKE PK (NEEDLE) ×3 IMPLANT
NS IRRIG 1000ML POUR BTL (IV SOLUTION) ×3 IMPLANT
PACK SHOULDER (CUSTOM PROCEDURE TRAY) ×3 IMPLANT
PAD ARMBOARD 7.5X6 YLW CONV (MISCELLANEOUS) ×6 IMPLANT
PROBE BIPOLAR ATHRO 135MM 90D (MISCELLANEOUS) ×2 IMPLANT
SET ARTHROSCOPY TUBING (MISCELLANEOUS) ×3
SET ARTHROSCOPY TUBING LN (MISCELLANEOUS) ×1 IMPLANT
SLING ARM IMMOBILIZER LRG (SOFTGOODS) ×2 IMPLANT
SLING ARM IMMOBILIZER MED (SOFTGOODS) IMPLANT
SPONGE LAP 4X18 X RAY DECT (DISPOSABLE) ×6 IMPLANT
STRIP CLOSURE SKIN 1/2X4 (GAUZE/BANDAGES/DRESSINGS) ×2 IMPLANT
SUCTION FRAZIER HANDLE 10FR (MISCELLANEOUS) ×4
SUCTION TUBE FRAZIER 10FR DISP (MISCELLANEOUS) ×1 IMPLANT
SUT ETHILON 3 0 PS 1 (SUTURE) ×3 IMPLANT
SUT FIBERWIRE 2-0 18 17.9 3/8 (SUTURE)
SUT MNCRL AB 3-0 PS2 18 (SUTURE) ×2 IMPLANT
SUT VIC AB 0 CT1 27 (SUTURE) ×9
SUT VIC AB 0 CT1 27XBRD ANBCTR (SUTURE) ×2 IMPLANT
SUT VIC AB 1 CT1 27 (SUTURE) ×3
SUT VIC AB 1 CT1 27XBRD ANBCTR (SUTURE) ×1 IMPLANT
SUT VIC AB 2-0 CT1 27 (SUTURE) ×6
SUT VIC AB 2-0 CT1 TAPERPNT 27 (SUTURE) ×1 IMPLANT
SUT VICRYL 0 UR6 27IN ABS (SUTURE) IMPLANT
SUTURE FIBERWR 2-0 18 17.9 3/8 (SUTURE) IMPLANT
SYR 20CC LL (SYRINGE) ×6 IMPLANT
SYR TB 1ML LUER SLIP (SYRINGE) ×3 IMPLANT
TAPE LABRALWHITE 1.5X36 (TAPE) IMPLANT
TAPE SUT LABRALTAP WHT/BLK (SUTURE) IMPLANT
TOWEL OR 17X24 6PK STRL BLUE (TOWEL DISPOSABLE) ×3 IMPLANT
TOWEL OR 17X26 10 PK STRL BLUE (TOWEL DISPOSABLE) ×3 IMPLANT
WAND HAND CNTRL MULTIVAC 90 (MISCELLANEOUS) IMPLANT
WATER STERILE IRR 1000ML POUR (IV SOLUTION) ×3 IMPLANT

## 2016-01-01 NOTE — Interval H&P Note (Signed)
History and Physical Interval Note:  01/01/2016 5:39 PM  Paula Cooley  has presented today for surgery, with the diagnosis of RIGHT SHOULDER SLAP TEAR  The various methods of treatment have been discussed with the patient and family. After consideration of risks, benefits and other options for treatment, the patient has consented to  Procedure(s): SHOULDER ARTHROSCOPY WITH LABRAL (SLAP) REPAIR (Right) as a surgical intervention .  The patient's history has been reviewed, patient examined, no change in status, stable for surgery.  I have reviewed the patient's chart and labs.  Questions were answered to the patient's satisfaction.     Anderson Malta

## 2016-01-01 NOTE — Anesthesia Postprocedure Evaluation (Signed)
Anesthesia Post Note  Patient: Paula Cooley  Procedure(s) Performed: Procedure(s) (LRB): SHOULDER ARTHROSCOPY WITH LABRAL (SLAP) REPAIR (Right)  Patient location during evaluation: PACU Anesthesia Type: General and Regional Level of consciousness: awake and alert Pain management: pain level controlled Vital Signs Assessment: post-procedure vital signs reviewed and stable Respiratory status: spontaneous breathing, nonlabored ventilation and respiratory function stable Cardiovascular status: blood pressure returned to baseline and stable Postop Assessment: no signs of nausea or vomiting Anesthetic complications: no    Last Vitals:  Vitals:   01/01/16 2015 01/01/16 2030  BP: 119/73 123/70  Pulse: (!) 50 (!) 57  Resp: 12 14  Temp:  36.7 C    Last Pain:  Vitals:   01/01/16 1329  TempSrc:   PainSc: 5                  Lunabella Badgett,W. EDMOND

## 2016-01-01 NOTE — Anesthesia Preprocedure Evaluation (Addendum)
Anesthesia Evaluation  Patient identified by MRN, date of birth, ID band Patient awake    Reviewed: Allergy & Precautions, NPO status , Patient's Chart, lab work & pertinent test results  Airway Mallampati: II  TM Distance: >3 FB Neck ROM: Full    Dental  (+) Teeth Intact, Dental Advisory Given   Pulmonary former smoker,    breath sounds clear to auscultation       Cardiovascular  Rhythm:Regular Rate:Normal     Neuro/Psych    GI/Hepatic   Endo/Other    Renal/GU      Musculoskeletal   Abdominal   Peds  Hematology   Anesthesia Other Findings   Reproductive/Obstetrics                             Anesthesia Physical Anesthesia Plan  ASA: I  Anesthesia Plan: General and Regional   Post-op Pain Management:    Induction: Intravenous  Airway Management Planned: Oral ETT  Additional Equipment:   Intra-op Plan:   Post-operative Plan: Extubation in OR  Informed Consent: I have reviewed the patients History and Physical, chart, labs and discussed the procedure including the risks, benefits and alternatives for the proposed anesthesia with the patient or authorized representative who has indicated his/her understanding and acceptance.   Dental advisory given  Plan Discussed with: CRNA and Anesthesiologist  Anesthesia Plan Comments: (Asthma well controlled no wheezing on exam.  Plan GA with interscalene block)        Anesthesia Quick Evaluation

## 2016-01-01 NOTE — Brief Op Note (Signed)
01/01/2016  7:24 PM  PATIENT:  Paula Cooley  25 y.o. female  PRE-OPERATIVE DIAGNOSIS:  RIGHT SHOULDER SLAP TEAR  POST-OPERATIVE DIAGNOSIS:  RIGHT SHOULDER SLAP TEAR  PROCEDURE:  Procedure(s): SHOULDER ARTHROSCOPY WITH LABRAL (SLAP) REPAIR  SURGEON:  Surgeon(s): Meredith Pel, MD  ASSISTANT: Laure Kidney RNFA  ANESTHESIA:   general  EBL: 10 ml    No intake/output data recorded.  BLOOD ADMINISTERED: none  DRAINS: none   LOCAL MEDICATIONS USED:  none  SPECIMEN:  No Specimen  COUNTS:  YES  TOURNIQUET:  * No tourniquets in log *  DICTATION: .Other Dictation: Dictation Number [id number]  PLAN OF CARE: Discharge to home after PACU  PATIENT DISPOSITION:  PACU - hemodynamically stable

## 2016-01-01 NOTE — Transfer of Care (Signed)
Immediate Anesthesia Transfer of Care Note  Patient: Paula Cooley  Procedure(s) Performed: Procedure(s): SHOULDER ARTHROSCOPY WITH LABRAL (SLAP) REPAIR (Right)  Patient Location: PACU  Anesthesia Type:GA combined with regional for post-op pain  Level of Consciousness: awake, alert  and oriented  Airway & Oxygen Therapy: Patient Spontanous Breathing and Patient connected to nasal cannula oxygen  Post-op Assessment: Report given to RN and Post -op Vital signs reviewed and stable  Post vital signs: Reviewed and stable  Last Vitals:  Vitals:   01/01/16 1535 01/01/16 1951  BP: 129/76 117/77  Pulse: 72 66  Resp: 12 15  Temp:  36.7 C    Last Pain:  Vitals:   01/01/16 1329  TempSrc:   PainSc: 5          Complications: No apparent anesthesia complications

## 2016-01-01 NOTE — Anesthesia Procedure Notes (Addendum)
Anesthesia Regional Block:  Interscalene brachial plexus block  Pre-Anesthetic Checklist: ,, timeout performed, Correct Patient, Correct Site, Correct Laterality, Correct Procedure, Correct Position, site marked, Risks and benefits discussed,  Surgical consent,  Pre-op evaluation,  At surgeon's request and post-op pain management  Laterality: Right  Prep: chloraprep       Needles:  Injection technique: Single-shot  Needle Type: Stimulator Needle - 80          Additional Needles:  Procedures: ultrasound guided (picture in chart) Interscalene brachial plexus block Narrative:  Start time: 01/01/2016 3:25 PM End time: 01/01/2016 3:30 PM Injection made incrementally with aspirations every 5 mL.  Performed by: Personally   Additional Notes: 20 cc 0.75% Naropin injected easily

## 2016-01-01 NOTE — Interval H&P Note (Signed)
History and Physical Interval Note:  01/01/2016 1:35 PM  Paula Cooley  has presented today for surgery, with the diagnosis of RIGHT SHOULDER SLAP TEAR  The various methods of treatment have been discussed with the patient and family. After consideration of risks, benefits and other options for treatment, the patient has consented to  Procedure(s): SHOULDER ARTHROSCOPY WITH LABRAL (SLAP) REPAIR (Right) as a surgical intervention .  The patient's history has been reviewed, patient examined, no change in status, stable for surgery.  I have reviewed the patient's chart and labs.  Questions were answered to the patient's satisfaction.     Anderson Malta

## 2016-01-01 NOTE — Anesthesia Procedure Notes (Signed)
Procedure Name: Intubation Date/Time: 01/01/2016 6:15 PM Performed by: Eligha Bridegroom Pre-anesthesia Checklist: Patient identified, Emergency Drugs available, Suction available and Patient being monitored Patient Re-evaluated:Patient Re-evaluated prior to inductionOxygen Delivery Method: Circle System Utilized Preoxygenation: Pre-oxygenation with 100% oxygen Intubation Type: IV induction Ventilation: Mask ventilation without difficulty Laryngoscope Size: Miller and 2 Grade View: Grade I Tube type: Oral Tube size: 7.5 mm Number of attempts: 1 Airway Equipment and Method: Stylet and Oral airway Placement Confirmation: ETT inserted through vocal cords under direct vision,  positive ETCO2 and breath sounds checked- equal and bilateral Secured at: 22 cm Tube secured with: Tape Dental Injury: Teeth and Oropharynx as per pre-operative assessment

## 2016-01-02 ENCOUNTER — Encounter (HOSPITAL_COMMUNITY): Payer: Self-pay | Admitting: Orthopedic Surgery

## 2016-01-02 ENCOUNTER — Telehealth (INDEPENDENT_AMBULATORY_CARE_PROVIDER_SITE_OTHER): Payer: Self-pay | Admitting: *Deleted

## 2016-01-02 NOTE — Telephone Encounter (Signed)
Please advise 

## 2016-01-02 NOTE — Telephone Encounter (Signed)
Add nsaid and ok for 2 oxy q 3 pls clal thx

## 2016-01-02 NOTE — Telephone Encounter (Signed)
IC and advised. She is also taking the methocarbomal,

## 2016-01-02 NOTE — Telephone Encounter (Signed)
Pt mother called stating pain medication that was given wasn't working and wanted to see if there was anything else she can have. Pt mother requesting call back [phone]

## 2016-01-02 NOTE — Op Note (Signed)
NAME:  Paula Cooley, Paula Cooley NO.:  [id number]  MEDICAL RECORD NO.:  [id number]  LOCATION:                                 FACILITY:  PHYSICIAN:  Anderson Malta, M.D.    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  PROCEDURES:  Right shoulder arthroscopy, debridement of superior-labral degenerative superior labrum anterior and posterior tear with open biceps tenodesis and bursectomy.  SURGEON:  Anderson Malta, M.D.  ASSISTANT:  Laure Kidney, RNFA.  INDICATIONS:  Paula Cooley is a 25 year old female with right shoulder pain. She has had surgery 5 years ago, which was labral debridement, but she has had continued pain in the shoulder.  MRI scan shows degenerative type SLAP tear.  Rotator cuff in articular surfaces and ligaments looked intact on MRI scan.  She has failed conservative management, wants to continue with overhead sports and presents now for operative management after explanation of risks and benefits.  OPERATING FINDINGS: 1. Examination under anesthesia.  The patient had external rotation of     15 degrees with abduction of 70 degrees.  She had full forward     flexion and full abduction on the right-hand side.  Shoulder     stability 1+ anterior and 1+ posterior with less than 1 cm sulcus     sign. 2. Diagnostic arthroscopy:     a.     Intact rotator cuff.     b.     Mild bursitis.     c.     Intact anterior-inferior and posterior-inferior glenohumeral      ligaments.     d.     Degenerative type 2 SLAP tear, which was not, did not appear      repairable particularly in the light of previous surgery.  PROCEDURE IN DETAIL:  The patient was brought to the operating room where general  anesthetic was induced.  Preoperative antibiotics were administered.  Time-out was called.  The patient was placed in the beach- chair position with the head in neutral position.  Right arm, shoulder and hand were prescrubbed with alcohol and Betadine, allowed to air dry, prepped with DuraPrep solution and draped in a sterile manner.  Charlie Pitter was used to cover the operative field.  After time-out was called, posterior portal was created 2 cm medial and inferior to the posterolateral margin of the acromion.  Diagnostic arthroscopy was performed.  Degeneration around the biceps anchor was present.  The biceps tendon itself appeared to have mild tendinosis at the base.  The glenohumeral articular surfaces were intact.  Anterior-inferior and posterior-inferior glenohumeral ligaments were intact.  Rotator cuff was intact.  Based on the patient's prior surgical history and operative findings, it was elected to proceed with biceps tenodesis as opposed to any type of SLAP repair on a primarily degenerative type 2 SLAP tear. There was no evidence of internal impingement.  Anterior portal created under direct visualization.  Biceps tendon released.  Superior labrum debrided.  Instruments were removed from portals, which were closed using 3-0 nylon.  Prior to removing the instruments, lateral portal was created and there was really pretty minimal bursitis present within the subacromial space.  No instrumentation was done at this time, because of that.  At this time, the portals were closed, 1-inch incision was made off the anterolateral margin of the acromion.  Deltoid split between the anterior and middle raphe, bicipital groove entered on the medial aspect.  Biceps tendon tenodesed using Arthrex interference screw within the bicipital groove, flushed to the bony surface.  This was done under appropriate tension.  Following this, thorough irrigation was performed. Deltoid split closed using 0 Vicryl  suture followed by interrupted inverted 2-0 Vicryl suture and a 3-0 Monocryl.  Impervious dressing and shoulder sling applied.  The patient tolerated the procedure well without immediate complication, transferred to the recovery room in stable condition.     Anderson Malta, M.D.   ______________________________ Darnell Level. Alphonzo Severance, M.D.    GSD/MEDQ  D:  01/01/2016  T:  01/02/2016  Job:  [id number]

## 2016-01-10 ENCOUNTER — Encounter (INDEPENDENT_AMBULATORY_CARE_PROVIDER_SITE_OTHER): Payer: Self-pay | Admitting: Orthopedic Surgery

## 2016-01-10 ENCOUNTER — Ambulatory Visit (INDEPENDENT_AMBULATORY_CARE_PROVIDER_SITE_OTHER): Payer: BLUE CROSS/BLUE SHIELD | Admitting: Orthopedic Surgery

## 2016-01-10 DIAGNOSIS — M24111 Other articular cartilage disorders, right shoulder: Secondary | ICD-10-CM

## 2016-01-10 MED ORDER — OXYCODONE HCL 5 MG PO CAPS: 5.0000 mg | ORAL_CAPSULE | ORAL | 0 refills | Status: DC | PRN

## 2016-01-10 NOTE — Progress Notes (Signed)
   Post-Op Visit Note   Patient: Paula Cooley           Date of Birth: 10/27/1990           MRN: [id number] Visit Date: 01/10/2016 PCP: Wyatt Haste, MD   Assessment & Plan:  Chief Complaint:  Chief Complaint  Patient presents with  . Right Shoulder - Routine Post Op   Visit Diagnoses:  1. Labral tear of shoulder, degenerative, right     Plan: Paula Cooley is a 25 year old female 9 days out right shoulder arthroscopy biceps tenodesis and bursectomy doing recently well having some pain in the front of her shoulder on exam she has excellent range of motion passively to refill her oxycodone 4 week return avoid active biceps she's been doing a lot of therapy on her own but I want her to focus on cuff strengthening  Follow-Up Instructions: No Follow-up on file.   Orders:  No orders of the defined types were placed in this encounter.  No orders of the defined types were placed in this encounter.    PMFS History: Patient Active Problem List   Diagnosis Date Noted  . Asthma 07/13/2013  . Exercise-induced asthma 07/13/2013  . Allergic rhinitis, cause unspecified 06/30/2011   Past Medical History:  Diagnosis Date  . Acetabular labrum tear    R shoulder   . Acne   . Asthma    exercise induced, no problems in quite a while, last use of inhaler- >one year  . Hypoglycemia    uses glucose tablets PRN  . Seasonal allergies     Family History  Problem Relation Age of Onset  . Hypertension Mother   . Heart disease Father 28    MI; s/p CABG  . Diabetes Father     resolved after weight loss  . Cancer Paternal Grandmother     lung    Past Surgical History:  Procedure Laterality Date  . ROTATOR CUFF REPAIR Right 07/2011   as also bicep tendon surgery  . SHOULDER ARTHROSCOPY WITH LABRAL REPAIR Right 01/01/2016   Procedure: SHOULDER ARTHROSCOPY WITH LABRAL (SLAP) REPAIR;  Surgeon: Meredith Pel, MD;  Location: [facility];  Service: Orthopedics;  Laterality: Right;  . WISDOM  TOOTH EXTRACTION     Social History   Occupational History  . Not on file.   Social History Main Topics  . Smoking status: Former Smoker    Quit date: 12/26/2014  . Smokeless tobacco: Never Used  . Alcohol use 0.0 oz/week     Comment: 3-5 drinks every other week  . Drug use: No  . Sexual activity: Yes    Partners: Male    Birth control/ protection: Pill

## 2016-01-11 NOTE — Addendum Note (Signed)
Addendum  created 01/11/16 2156 by Roberts Gaudy, MD   Anesthesia Intra Blocks edited, Sign clinical note

## 2016-01-29 ENCOUNTER — Other Ambulatory Visit (INDEPENDENT_AMBULATORY_CARE_PROVIDER_SITE_OTHER): Payer: Self-pay | Admitting: Orthopedic Surgery

## 2016-01-30 ENCOUNTER — Other Ambulatory Visit (INDEPENDENT_AMBULATORY_CARE_PROVIDER_SITE_OTHER): Payer: Self-pay | Admitting: Orthopedic Surgery

## 2016-01-30 MED ORDER — OXYCODONE HCL 5 MG PO CAPS: 5.0000 mg | ORAL_CAPSULE | Freq: Three times a day (TID) | ORAL | 0 refills | Status: DC | PRN

## 2016-01-30 NOTE — Telephone Encounter (Signed)
Okay to refill? 

## 2016-02-07 ENCOUNTER — Ambulatory Visit (INDEPENDENT_AMBULATORY_CARE_PROVIDER_SITE_OTHER): Payer: BLUE CROSS/BLUE SHIELD | Admitting: Orthopedic Surgery

## 2016-02-20 ENCOUNTER — Encounter (INDEPENDENT_AMBULATORY_CARE_PROVIDER_SITE_OTHER): Payer: Self-pay | Admitting: Orthopedic Surgery

## 2016-02-20 ENCOUNTER — Ambulatory Visit (INDEPENDENT_AMBULATORY_CARE_PROVIDER_SITE_OTHER): Payer: BLUE CROSS/BLUE SHIELD | Admitting: Orthopedic Surgery

## 2016-02-20 DIAGNOSIS — S43431D Superior glenoid labrum lesion of right shoulder, subsequent encounter: Secondary | ICD-10-CM

## 2016-02-20 NOTE — Progress Notes (Signed)
   Post-Op Visit Note   Patient: Paula Cooley           Date of Birth: [birth year]           MRN: [id number] Visit Date: 02/20/2016 PCP: Paula Haste, MD   Assessment & Plan:  Chief Complaint:  Chief Complaint  Patient presents with  . Right Shoulder - Routine Post Op   Visit Diagnoses:  1. Superior glenoid labrum lesion of right shoulder, subsequent encounter     Plan: Paula Cooley is a 26 year old female who is 7 weeks out right shoulder arthroscopy biceps tendon release and tenodesis.  She had been doing very well up until today.  She is having little bit of pain in the middle anterior deltoid region.  On exam she has full active and passive range of motion excellent strength negative apprehension biceps tendon feels stable and the resting length feels good.  Plan at this time is for her to start doing strengthening exercises.  She is doing a home exercise program.  I'll see her back in 6 weeks at which time a likely release her to do some league softball  Follow-Up Instructions: Return in about 6 weeks (around 04/02/2016).   Orders:  No orders of the defined types were placed in this encounter.  No orders of the defined types were placed in this encounter.    PMFS History: Patient Active Problem List   Diagnosis Date Noted  . Asthma 07/13/2013  . Exercise-induced asthma 07/13/2013  . Allergic rhinitis, cause unspecified 06/30/2011   Past Medical History:  Diagnosis Date  . Acetabular labrum tear    R shoulder   . Acne   . Asthma    exercise induced, no problems in quite a while, last use of inhaler- >one year  . Hypoglycemia    uses glucose tablets PRN  . Seasonal allergies     Family History  Problem Relation Age of Onset  . Hypertension Mother   . Heart disease Father 84    MI; s/p CABG  . Diabetes Father     resolved after weight loss  . Cancer Paternal Grandmother     lung    Past Surgical History:  Procedure Laterality Date  . ROTATOR CUFF REPAIR  Right 07/2011   as also bicep tendon surgery  . SHOULDER ARTHROSCOPY WITH LABRAL REPAIR Right 01/01/2016   Procedure: SHOULDER ARTHROSCOPY WITH LABRAL (SLAP) REPAIR;  Surgeon: Meredith Pel, MD;  Location: [facility];  Service: Orthopedics;  Laterality: Right;  . WISDOM TOOTH EXTRACTION     Social History   Occupational History  . Not on file.   Social History Main Topics  . Smoking status: Former Smoker    Quit date: 12/26/2014  . Smokeless tobacco: Never Used  . Alcohol use 0.0 oz/week     Comment: 3-5 drinks every other week  . Drug use: No  . Sexual activity: Yes    Partners: Male    Birth control/ protection: Pill

## 2016-04-02 ENCOUNTER — Ambulatory Visit (INDEPENDENT_AMBULATORY_CARE_PROVIDER_SITE_OTHER): Payer: BLUE CROSS/BLUE SHIELD | Admitting: Orthopedic Surgery

## 2016-04-02 ENCOUNTER — Encounter (INDEPENDENT_AMBULATORY_CARE_PROVIDER_SITE_OTHER): Payer: Self-pay | Admitting: Orthopedic Surgery

## 2016-04-02 DIAGNOSIS — M24111 Other articular cartilage disorders, right shoulder: Secondary | ICD-10-CM

## 2016-04-02 NOTE — Progress Notes (Signed)
   Post-Op Visit Note   Patient: Paula Cooley           Date of Birth: 1990/05/28           MRN: [id number] Visit Date: 04/02/2016 PCP: Wyatt Haste, MD   Assessment & Plan:  Chief Complaint:  Chief Complaint  Patient presents with  . Right Shoulder - Routine Post Op   Visit Diagnoses:  1. Labral tear of shoulder, degenerative, right     Plan: Patient is a 26 year old female who is now 3 months out right shoulder arthroscopy debridement with open biceps tenodesis and bursectomy.  In general she's doing well she is having no pain she starts softball season next month.  She is doing her own physical therapy with therapy and said strength training.  On exam she has excellent range of motion improving biceps strength as well as external rotation strength.  Patient has full active and passive range of motion of the right shoulder.  No real pain with range of motion.  Plan at this time is to let her resume softball activities in a month.  When she starts throwing again I would like for her to do it with a interval throwing program.  If she has any other issues with the shoulder she should try taking some anti-inflammatories first and if that doesn't help to decrease any soreness she is having come back in for repeat evaluation.  Otherwise I think she is doing well and I will see her back as needed.  She is happy with her surgical result Follow-Up Instructions: No Follow-up on file.   Orders:  No orders of the defined types were placed in this encounter.  No orders of the defined types were placed in this encounter.   Imaging: No results found.  PMFS History: Patient Active Problem List   Diagnosis Date Noted  . Labral tear of shoulder, degenerative, right 04/02/2016  . Asthma 07/13/2013  . Exercise-induced asthma 07/13/2013  . Allergic rhinitis, cause unspecified 06/30/2011   Past Medical History:  Diagnosis Date  . Acetabular labrum tear    R shoulder   . Acne   .  Asthma    exercise induced, no problems in quite a while, last use of inhaler- >one year  . Hypoglycemia    uses glucose tablets PRN  . Seasonal allergies     Family History  Problem Relation Age of Onset  . Hypertension Mother   . Heart disease Father 65    MI; s/p CABG  . Diabetes Father     resolved after weight loss  . Cancer Paternal Grandmother     lung    Past Surgical History:  Procedure Laterality Date  . ROTATOR CUFF REPAIR Right 07/2011   as also bicep tendon surgery  . SHOULDER ARTHROSCOPY WITH LABRAL REPAIR Right 01/01/2016   Procedure: SHOULDER ARTHROSCOPY WITH LABRAL (SLAP) REPAIR;  Surgeon: Meredith Pel, MD;  Location: [facility];  Service: Orthopedics;  Laterality: Right;  . WISDOM TOOTH EXTRACTION     Social History   Occupational History  . Not on file.   Social History Main Topics  . Smoking status: Former Smoker    Quit date: 12/26/2014  . Smokeless tobacco: Never Used  . Alcohol use 0.0 oz/week     Comment: 3-5 drinks every other week  . Drug use: No  . Sexual activity: Yes    Partners: Male    Birth control/ protection: Pill

## 2018-02-10 DIAGNOSIS — K631 Perforation of intestine (nontraumatic): Secondary | ICD-10-CM

## 2018-02-10 HISTORY — DX: Perforation of intestine (nontraumatic): K63.1

## 2018-07-22 ENCOUNTER — Other Ambulatory Visit: Payer: Self-pay

## 2018-07-22 ENCOUNTER — Ambulatory Visit: Payer: No Typology Code available for payment source | Admitting: Family Medicine

## 2018-07-22 ENCOUNTER — Encounter: Payer: Self-pay | Admitting: Family Medicine

## 2018-07-22 ENCOUNTER — Telehealth: Payer: Self-pay

## 2018-07-22 VITALS — Wt 173.0 lb

## 2018-07-22 DIAGNOSIS — F901 Attention-deficit hyperactivity disorder, predominantly hyperactive type: Secondary | ICD-10-CM

## 2018-07-22 DIAGNOSIS — Z79899 Other long term (current) drug therapy: Secondary | ICD-10-CM | POA: Diagnosis not present

## 2018-07-22 MED ORDER — AMPHETAMINE-DEXTROAMPHETAMINE 10 MG PO TABS: 10.0000 mg | ORAL_TABLET | Freq: Two times a day (BID) | ORAL | 0 refills | Status: DC

## 2018-07-22 NOTE — Progress Notes (Signed)
   Subjective:    Patient ID: Paula Cooley, female    DOB: [birth year], 28 y.o.   MRN: [id number]  HPI Documentation for virtual telephone encounter. Documentation for virtual audio and video telecommunications through [employer] encounter:  The patient was located at home. The provider was located in the office. The patient did consent to this visit and is aware of possible charges through their insurance for this visit. The other persons participating in this telemedicine service were none. Time spent on call was 10 minutes and in review of previous records >25 minutes total.  This virtual service is not related to other E/M service within previous 7 days. Today's visit is for evaluation for ADD.  She states that in college she was diagnosed with ADD but did not want to go on any medications.  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  It was not until she got in a college that she started to struggle.  She notes difficulty with focus, paying attention, being fidgety.   Review of Systems     Objective:   Physical Exam Alert and in no distress.  ADD questionnaire was filled out and definitely positive for ADHD.       Assessment & Plan:  Attention deficit hyperactivity disorder (ADHD), predominantly hyperactive type - Plan: amphetamine-dextroamphetamine (ADDERALL) 10 MG tablet, I will start her out on 10 mg.  She is to let me know whether it works, how long it works and if she had any side effects from this.  I will schedule her for another virtual visit in 1 week.  Did give her permission to go from 10-20 if she notes no change in taking this medication.  She was comfortable with that.

## 2018-07-22 NOTE — Telephone Encounter (Signed)
Called pt to advise that a virtual appt is needed in  two weeks . No answer lvm. [place]

## 2018-08-03 ENCOUNTER — Encounter: Payer: Self-pay | Admitting: Family Medicine

## 2018-08-03 MED ORDER — AMPHETAMINE-DEXTROAMPHETAMINE 20 MG PO TABS: 20.0000 mg | ORAL_TABLET | Freq: Two times a day (BID) | ORAL | 0 refills | Status: DC

## 2018-08-31 ENCOUNTER — Ambulatory Visit: Payer: Self-pay

## 2018-08-31 ENCOUNTER — Encounter: Payer: Self-pay | Admitting: Family Medicine

## 2018-08-31 ENCOUNTER — Ambulatory Visit (INDEPENDENT_AMBULATORY_CARE_PROVIDER_SITE_OTHER): Payer: No Typology Code available for payment source | Admitting: Family Medicine

## 2018-08-31 ENCOUNTER — Other Ambulatory Visit: Payer: Self-pay

## 2018-08-31 DIAGNOSIS — M25531 Pain in right wrist: Secondary | ICD-10-CM | POA: Diagnosis not present

## 2018-08-31 MED ORDER — METHYLPREDNISOLONE 4 MG PO TBPK: ORAL_TABLET | ORAL | 0 refills | Status: DC

## 2018-08-31 MED ORDER — DICLOFENAC SODIUM 1 % TD GEL: 4.0000 g | Freq: Four times a day (QID) | TRANSDERMAL | 6 refills | Status: DC | PRN

## 2018-08-31 NOTE — Progress Notes (Signed)
Office Visit Note   Patient: Paula Cooley           Date of Birth: 01-25-1991           MRN: [id number] Visit Date: 08/31/2018 Requested by: Denita Lung, MD [place],   [id number] PCP: Denita Lung, MD  Subjective: Chief Complaint  Patient presents with  . Right Forearm - Pain    DOI 08/21/2018 Unsure if she tucked her arm "wrong" when she dove to catch a softball with her gloved left hand. Felt a snap in the forearm/wrist area when she threw the ball afterward. Right-hand dominant.    HPI: She is a 28 year old right-hand-dominant female with right forearm pain.  On July 11 she was playing softball as a Industrial/product designer.  She dove for a line drive with her left arm across her body.  She does not remember how her arm was injured, but she felt soreness immediately afterward.  She was able to finish the game pitching and batting although with some pain.  Since then she has had some swelling and crepitus on the dorsum of her forearm with wrist and thumb movements.  She has tried ice, Advil and Aleve with minimal change in symptoms.  No previous problems with her forearm.               ROS: Denies fevers or chills.  All other systems were reviewed and are negative.  Objective: Vital Signs: There were no vitals taken for this visit.  Physical Exam:  General:  Alert and oriented, in no acute distress. Pulm:  Breathing unlabored. Psy:  Normal mood, congruent affect. Skin: No erythema or bruising. Right wrist: She has full range of motion of the wrist and fingers but pain with wrist dorsiflexion and thumb extension.  There is crepitus at the distal dorsal forearm with active range of motion.  Mild tenderness on the volar aspect of the distal radius, maximum tenderness on the dorsal aspect with no step-off palpable.  Imaging: X-rays right forearm: Normal anatomic alignment with no sign of fracture.  Limited diagnostic ultrasound: There is significant tenosynovitis at  the intersection of the first and second compartments.   Assessment & Plan: 1.  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  If not improved by next week then possibly cortisone injection.     Procedures: No procedures performed  No notes on file     PMFS History: Patient Active Problem List   Diagnosis Date Noted  . Labral tear of shoulder, degenerative, right 04/02/2016  . Asthma 07/13/2013  . Exercise-induced asthma 07/13/2013  . Allergic rhinitis, cause unspecified 06/30/2011   Past Medical History:  Diagnosis Date  . Acetabular labrum tear    R shoulder   . Acne   . Asthma    exercise induced, no problems in quite a while, last use of inhaler- >one year  . Hypoglycemia    uses glucose tablets PRN  . Seasonal allergies     Family History  Problem Relation Age of Onset  . Hypertension Mother   . Heart disease Father 70       MI; s/p CABG  . Diabetes Father        resolved after weight loss  . Cancer Paternal Grandmother        lung    Past Surgical History:  Procedure Laterality Date  . ROTATOR CUFF REPAIR Right 07/2011  as also bicep tendon surgery  . SHOULDER ARTHROSCOPY WITH LABRAL REPAIR Right 01/01/2016   Procedure: SHOULDER ARTHROSCOPY WITH LABRAL (SLAP) REPAIR;  Surgeon: Meredith Pel, MD;  Location: [facility];  Service: Orthopedics;  Laterality: Right;  . WISDOM TOOTH EXTRACTION     Social History   Occupational History  . Not on file  Tobacco Use  . Smoking status: Former Smoker    Quit date: 12/26/2014    Years since quitting: 3.6  . Smokeless tobacco: Never Used  Substance and Sexual Activity  . Alcohol use: Yes    Alcohol/week: 0.0 standard drinks    Comment: 3-5 drinks every other week  . Drug use: No  . Sexual activity: Yes    Partners: Male    Birth control/protection: Pill

## 2018-08-31 NOTE — Patient Instructions (Signed)
    Intersection Syndrome

## 2018-09-01 ENCOUNTER — Encounter: Payer: Self-pay | Admitting: Family Medicine

## 2018-10-12 ENCOUNTER — Other Ambulatory Visit: Payer: Self-pay | Admitting: Family Medicine

## 2018-10-12 NOTE — Telephone Encounter (Signed)
The last prescription was written for 8/23.  So she should have enough till the end of the month.

## 2018-10-12 NOTE — Telephone Encounter (Signed)
LVM for pt advise script  Was last written for 8/23. [place]

## 2018-10-13 NOTE — Telephone Encounter (Signed)
Sent my chart message due to no answer. [place]

## 2018-11-18 ENCOUNTER — Other Ambulatory Visit: Payer: Self-pay | Admitting: Family Medicine

## 2018-11-18 MED ORDER — AMPHETAMINE-DEXTROAMPHETAMINE 20 MG PO TABS: 20.0000 mg | ORAL_TABLET | Freq: Two times a day (BID) | ORAL | 0 refills | Status: DC

## 2018-11-18 NOTE — Telephone Encounter (Signed)
Is this okay to refill? 

## 2018-12-27 ENCOUNTER — Emergency Department: Payer: No Typology Code available for payment source

## 2018-12-27 ENCOUNTER — Inpatient Hospital Stay
Admission: EM | Admit: 2018-12-27 | Discharge: 2019-01-05 | DRG: 329 | Disposition: A | Discharge status: Home / Self Care | Payer: No Typology Code available for payment source | Attending: Surgery | Admitting: Surgery

## 2018-12-27 ENCOUNTER — Other Ambulatory Visit: Payer: Self-pay

## 2018-12-27 DIAGNOSIS — Z20828 Contact with and (suspected) exposure to other viral communicable diseases: Secondary | ICD-10-CM | POA: Diagnosis present

## 2018-12-27 DIAGNOSIS — R1110 Vomiting, unspecified: Secondary | ICD-10-CM

## 2018-12-27 DIAGNOSIS — Z5331 Laparoscopic surgical procedure converted to open procedure: Secondary | ICD-10-CM

## 2018-12-27 DIAGNOSIS — R112 Nausea with vomiting, unspecified: Secondary | ICD-10-CM

## 2018-12-27 DIAGNOSIS — R1031 Right lower quadrant pain: Secondary | ICD-10-CM | POA: Diagnosis not present

## 2018-12-27 DIAGNOSIS — K50019 Crohn's disease of small intestine with unspecified complications: Secondary | ICD-10-CM

## 2018-12-27 DIAGNOSIS — R52 Pain, unspecified: Secondary | ICD-10-CM

## 2018-12-27 DIAGNOSIS — Z87891 Personal history of nicotine dependence: Secondary | ICD-10-CM

## 2018-12-27 DIAGNOSIS — K469 Unspecified abdominal hernia without obstruction or gangrene: Secondary | ICD-10-CM | POA: Diagnosis not present

## 2018-12-27 DIAGNOSIS — Q430 Meckel's diverticulum (displaced) (hypertrophic): Secondary | ICD-10-CM | POA: Diagnosis not present

## 2018-12-27 DIAGNOSIS — K529 Noninfective gastroenteritis and colitis, unspecified: Secondary | ICD-10-CM

## 2018-12-27 DIAGNOSIS — E876 Hypokalemia: Secondary | ICD-10-CM | POA: Diagnosis not present

## 2018-12-27 DIAGNOSIS — K660 Peritoneal adhesions (postprocedural) (postinfection): Secondary | ICD-10-CM | POA: Diagnosis present

## 2018-12-27 DIAGNOSIS — K650 Generalized (acute) peritonitis: Secondary | ICD-10-CM | POA: Diagnosis present

## 2018-12-27 DIAGNOSIS — Z8249 Family history of ischemic heart disease and other diseases of the circulatory system: Secondary | ICD-10-CM

## 2018-12-27 DIAGNOSIS — J45909 Unspecified asthma, uncomplicated: Secondary | ICD-10-CM | POA: Diagnosis present

## 2018-12-27 DIAGNOSIS — R109 Unspecified abdominal pain: Secondary | ICD-10-CM | POA: Diagnosis present

## 2018-12-27 LAB — CBC
HCT: 39.3 % (ref 36.0–46.0)
Hemoglobin: 13.5 g/dL (ref 12.0–15.0)
MCH: 30.5 pg (ref 26.0–34.0)
MCHC: 34.4 g/dL (ref 30.0–36.0)
MCV: 88.9 fL (ref 80.0–100.0)
Platelets: 264 10*3/uL (ref 150–400)
RBC: 4.42 MIL/uL (ref 3.87–5.11)
RDW: 12.5 % (ref 11.5–15.5)
WBC: 14.5 10*3/uL — ABNORMAL HIGH (ref 4.0–10.5)
nRBC: 0 % (ref 0.0–0.2)

## 2018-12-27 LAB — URINALYSIS, COMPLETE (UACMP) WITH MICROSCOPIC
Bilirubin Urine: NEGATIVE
Glucose, UA: NEGATIVE mg/dL
Hgb urine dipstick: NEGATIVE
Ketones, ur: NEGATIVE mg/dL
Nitrite: NEGATIVE
Protein, ur: NEGATIVE mg/dL
Specific Gravity, Urine: 1.015 (ref 1.005–1.030)
pH: 6 (ref 5.0–8.0)

## 2018-12-27 LAB — COMPREHENSIVE METABOLIC PANEL
ALT: 14 U/L (ref 0–44)
AST: 18 U/L (ref 15–41)
Albumin: 3.7 g/dL (ref 3.5–5.0)
Alkaline Phosphatase: 68 U/L (ref 38–126)
Anion gap: 11 (ref 5–15)
BUN: 11 mg/dL (ref 6–20)
CO2: 23 mmol/L (ref 22–32)
Calcium: 9.1 mg/dL (ref 8.9–10.3)
Chloride: 105 mmol/L (ref 98–111)
Creatinine, Ser: 0.8 mg/dL (ref 0.44–1.00)
GFR calc Af Amer: >60 mL/min (ref >60)
GFR calc non Af Amer: >60 mL/min (ref >60)
Glucose, Bld: 123 mg/dL — ABNORMAL HIGH (ref 70–99)
Potassium: 3.8 mmol/L (ref 3.5–5.1)
Sodium: 139 mmol/L (ref 135–145)
Total Bilirubin: 0.6 mg/dL (ref 0.3–1.2)
Total Protein: 7.3 g/dL (ref 6.5–8.1)

## 2018-12-27 LAB — LIPASE, BLOOD: Lipase: 32 U/L (ref 11–51)

## 2018-12-27 LAB — POCT PREGNANCY, URINE: Preg Test, Ur: NEGATIVE

## 2018-12-27 MED ORDER — IOHEXOL 300 MG/ML  SOLN
100.0000 mL | Freq: Once | INTRAMUSCULAR | Status: AC | PRN
  Administered 2018-12-27: 100 mL via INTRAVENOUS
  Filled 2018-12-27: qty 100

## 2018-12-27 MED ORDER — HYDROMORPHONE HCL 1 MG/ML IJ SOLN
1.0000 mg | Freq: Once | INTRAMUSCULAR | Status: AC
  Administered 2018-12-27: 1 mg via INTRAVENOUS
  Filled 2018-12-27: qty 1

## 2018-12-27 MED ORDER — MORPHINE SULFATE (PF) 4 MG/ML IV SOLN
4.0000 mg | Freq: Once | INTRAVENOUS | Status: AC
  Administered 2018-12-28: 01:00:00 4 mg via INTRAVENOUS
  Filled 2018-12-27: qty 1

## 2018-12-27 MED ORDER — ONDANSETRON HCL 4 MG/2ML IJ SOLN
4.0000 mg | Freq: Once | INTRAMUSCULAR | Status: AC
  Administered 2018-12-27: 4 mg via INTRAVENOUS
  Filled 2018-12-27: qty 2

## 2018-12-27 MED ORDER — MORPHINE SULFATE (PF) 4 MG/ML IV SOLN
4.0000 mg | Freq: Once | INTRAVENOUS | Status: AC
  Administered 2018-12-27: 21:00:00 4 mg via INTRAVENOUS
  Filled 2018-12-27: qty 1

## 2018-12-27 MED ORDER — IOHEXOL 9 MG/ML PO SOLN
500.0000 mL | Freq: Two times a day (BID) | ORAL | Status: DC | PRN
  Administered 2018-12-27: 21:00:00 500 mL via ORAL
  Filled 2018-12-27 (×2): qty 500

## 2018-12-27 MED ORDER — SODIUM CHLORIDE 0.9 % IV BOLUS
1000.0000 mL | Freq: Once | INTRAVENOUS | Status: AC
  Administered 2018-12-27: 21:00:00 1000 mL via INTRAVENOUS

## 2018-12-27 NOTE — ED Provider Notes (Signed)
-----------------------------------------   11:52 PM on 12/27/2018 -----------------------------------------  Assuming care from [facility].  In short, Paula Cooley is a 28 y.o. female with a chief complaint of RLQ abdominal pain.  Refer to the original H&P for additional details.  The current plan of care is to follow up MRI and reassess.    ----------------------------------------- 1:32 AM on 12/28/2018 -----------------------------------------  I spoke by phone with Dr. Joelyn Oms the radiologist.  The MRI does not reveal any emergent finding, with no indication of appendicitis nor adnexal pathology.  The patient does have terminal ileitis and enteritis which brings up at least the possibility of Crohn's disease.  I reassessed the patient and talked with her and her mother who is at bedside.  The patient is still having severe pain which is worse if she straightens up out of the fetal position.  She has not been able to eat or drink anything and has intractable nausea as well.  We had a long conversation about the findings of her imaging, the potential diagnosis of Crohn's disease, and outpatient versus inpatient treatment, but at this point she has intractable pain and nausea/vomiting and cannot stand up to walk even with 3 rounds of IV narcotics that she has already received.  I have ordered another dose of morphine 4 mg IV and Zofran 4 mg IV as well as another liter of normal saline.  I have put in a consult to the hospitalist for admission.  The patient will likely benefit from n.p.o. status, fluids, analgesics and antiemetics, and GI consultation.  However at this time I am not calling the GI doctor tonight given that there is no indication for emergent intervention and they can be consulted in the morning.   ----------------------------------------- 2:15 AM on 12/28/2018 -----------------------------------------  I spoke by phone with Dr. Hal Hope with the hospitalist service.   We discussed the case and he will admit her for further management.   Hinda Kehr, MD 12/28/18 [phone]

## 2018-12-27 NOTE — ED Provider Notes (Signed)
[facility] Emergency Department Provider Note  ____________________________________________  Time seen: Approximately 8:25 PM  I have reviewed the triage vital signs and the nursing notes.   HISTORY  Chief Complaint Abdominal Pain    HPI Paula Cooley is a 28 y.o. female who presents the emergency department complaining of sudden right lower quadrant abdominal pain, nausea, vomiting, anorexia, fevers and chills beginning this morning.  Patient reports that around 2 AM she was awoken with the pain.  No history of same.  Patient has her appendix.  Patient has had no diarrhea or constipation.  Pain is localized to the right lower quadrant.   Patient denies any URI symptoms.  No dysuria, polyuria, hematuria.  No vaginal bleeding or discharge.  No chance of pregnancy.        Past Medical History:  Diagnosis Date  . Acetabular labrum tear    R shoulder   . Acne   . Asthma    exercise induced, no problems in quite a while, last use of inhaler- >one year  . Hypoglycemia    uses glucose tablets PRN  . Seasonal allergies     Patient Active Problem List   Diagnosis Date Noted  . Labral tear of shoulder, degenerative, right 04/02/2016  . Asthma 07/13/2013  . Exercise-induced asthma 07/13/2013  . Allergic rhinitis, cause unspecified 06/30/2011    Past Surgical History:  Procedure Laterality Date  . ROTATOR CUFF REPAIR Right 07/2011   as also bicep tendon surgery  . SHOULDER ARTHROSCOPY WITH LABRAL REPAIR Right 01/01/2016   Procedure: SHOULDER ARTHROSCOPY WITH LABRAL (SLAP) REPAIR;  Surgeon: Meredith Pel, MD;  Location: [place];  Service: Orthopedics;  Laterality: Right;  . WISDOM TOOTH EXTRACTION      Prior to Admission medications   Medication Sig Start Date End Date Taking? Authorizing Provider  acetaminophen (TYLENOL) 500 MG tablet Take 1,500 mg by mouth every 6 (six) hours as needed.    [provider]  albuterol (PROVENTIL HFA;VENTOLIN  HFA) 108 (90 BASE) MCG/ACT inhaler Inhale 2 puffs into the lungs every 6 (six) hours as needed for wheezing. 07/13/13   Rita Ohara, MD  amphetamine-dextroamphetamine (ADDERALL) 20 MG tablet Take 1 tablet (20 mg total) by mouth 2 (two) times daily. 01/18/19   Denita Lung, MD  amphetamine-dextroamphetamine (ADDERALL) 20 MG tablet Take 1 tablet (20 mg total) by mouth 2 (two) times daily. 12/19/18   Denita Lung, MD  amphetamine-dextroamphetamine (ADDERALL) 20 MG tablet Take 1 tablet (20 mg total) by mouth 2 (two) times daily. 11/18/18   Denita Lung, MD  diclofenac sodium (VOLTAREN) 1 % GEL Apply 4 g topically 4 (four) times daily as needed. 08/31/18   Hilts, Legrand Como, MD  estradiol (ESTRACE) 1 MG tablet Take 1 mg by mouth daily. 08/24/18   [provider]  glucose 4 GM chewable tablet Chew 3-4 tablets by mouth as needed for low blood sugar.    [provider]  levocetirizine (XYZAL ALLERGY 24HR) 5 MG tablet Take 5 mg by mouth every evening.    [provider]  levonorgestrel-ethinyl estradiol (NORDETTE) 0.15-30 MG-MCG tablet Take 1 tablet by mouth daily.    [provider]  methylPREDNISolone (MEDROL DOSEPAK) 4 MG TBPK tablet As directed for 6 days. 08/31/18   Hilts, Legrand Como, MD  Multiple Vitamins-Minerals (MULTIVITAMIN WITH MINERALS) tablet Take 1 tablet by mouth daily.    [provider]  naproxen sodium (ANAPROX) 220 MG tablet Take 880 mg by mouth 2 (  two) times daily with a meal.    [provider]    Allergies No known allergies  Family History  Problem Relation Age of Onset  . Hypertension Mother   . Heart disease Father 47       MI; s/p CABG  . Diabetes Father        resolved after weight loss  . Cancer Paternal Grandmother        lung    Social History Social History   Tobacco Use  . Smoking status: Former Smoker    Quit date: 12/26/2014    Years since quitting: 4.0  . Smokeless tobacco: Never Used  Substance Use Topics   . Alcohol use: Yes    Alcohol/week: 0.0 standard drinks    Comment: 3-5 drinks every other week  . Drug use: No     Review of Systems  Constitutional: No fever/chills Eyes: No visual changes. No discharge ENT: No upper respiratory complaints. Cardiovascular: no chest pain. Respiratory: no cough. No SOB. Gastrointestinal: Right lower quadrant pain with associated anorexia, nausea, vomiting..  No diarrhea.  No constipation. Genitourinary: Negative for dysuria. No hematuria.  No vaginal bleeding or discharge Musculoskeletal: Negative for musculoskeletal pain. Skin: Negative for rash, abrasions, lacerations, ecchymosis. Neurological: Negative for headaches, focal weakness or numbness. 10-point ROS otherwise negative.  ____________________________________________   PHYSICAL EXAM:  VITAL SIGNS: ED Triage Vitals [12/27/18 1826]  Enc Vitals Group     BP 125/86     Pulse Rate (!) 120     Resp 16     Temp 99.2 F (37.3 C)     Temp Source Oral     SpO2 100 %     Weight 170 lb (77.1 kg)     Height 5' 6"  (1.676 m)     Head Circumference      Peak Flow      Pain Score 8     Pain Loc      Pain Edu?      Excl. in [place]?      Constitutional: Alert and oriented. Well appearing and in no acute distress. Eyes: Conjunctivae are normal. PERRL. EOMI. Head: Atraumatic. ENT:      Ears:       Nose: No congestion/rhinnorhea.      Mouth/Throat: Mucous membranes are moist.  Neck: No stridor.   Hematological/Lymphatic/Immunilogical: No cervical lymphadenopathy. Cardiovascular: Normal rate, regular rhythm. Normal S1 and S2.  Good peripheral circulation. Respiratory: Normal respiratory effort without tachypnea or retractions. Lungs CTAB. Good air entry to the bases with no decreased or absent breath sounds. Gastrointestinal: Bowel sounds 4 quadrants.  Patient is exquisitely tender to palpation over McBurney's point, as well as the right lower quadrant.  No other significant tenderness to  palpation.  No right upper quadrant tenderness.  No Murphy sign.  Positive for rebound tenderness, Rovsing's and obturators.  Positive for guarding with the right lower quadrant.  No rigidity.  No palpable masses. No distention. No CVA tenderness. Musculoskeletal: Full range of motion to all extremities. No gross deformities appreciated. Neurologic:  Normal speech and language. No gross focal neurologic deficits are appreciated.  Skin:  Skin is warm, dry and intact. No rash noted. Psychiatric: Mood and affect are normal. Speech and behavior are normal. Patient exhibits appropriate insight and judgement.   ____________________________________________   LABS (all labs ordered are listed, but only abnormal results are displayed)  Labs Reviewed  COMPREHENSIVE METABOLIC PANEL - Abnormal; Notable for the following components:  Result Value   Glucose, Bld 123 (*)    All other components within normal limits  CBC - Abnormal; Notable for the following components:   WBC 14.5 (*)    All other components within normal limits  URINALYSIS, COMPLETE (UACMP) WITH MICROSCOPIC - Abnormal; Notable for the following components:   Color, Urine YELLOW (*)    APPearance CLOUDY (*)    Leukocytes,Ua LARGE (*)    Bacteria, UA RARE (*)    All other components within normal limits  LIPASE, BLOOD  POC URINE PREG, ED  POCT PREGNANCY, URINE   ____________________________________________  EKG   ____________________________________________  RADIOLOGY I personally viewed and evaluated these images as part of my medical decision making, as well as reviewing the written report by the radiologist.  Ct Abdomen Pelvis W Contrast  Result Date: 12/27/2018 CLINICAL DATA:  Abdominal pain, right lower quadrant pain, nausea, vomiting EXAM: CT ABDOMEN AND PELVIS WITH CONTRAST TECHNIQUE: Multidetector CT imaging of the abdomen and pelvis was performed using the standard protocol following bolus administration of  intravenous contrast. CONTRAST:  133m OMNIPAQUE IOHEXOL 300 MG/ML  SOLN COMPARISON:  None. FINDINGS: Lower chest: Lung bases are clear. No effusions. Heart is normal size. Hepatobiliary: No focal hepatic abnormality. Gallbladder unremarkable. Pancreas: No focal abnormality or ductal dilatation. Spleen: No focal abnormality.  Normal size. Adrenals/Urinary Tract: No adrenal abnormality. No focal renal abnormality. No stones or hydronephrosis. Urinary bladder is unremarkable. Stomach/Bowel: Stomach is distended with fluid and debris. Mild distention of the colon with fluid. Distal small bowel loops are fluid-filled. No bowel distention to suggest abscess. Appendix not definitively seen. Vascular/Lymphatic: No evidence of aneurysm or adenopathy. Reproductive: Uterus and adnexa unremarkable.  No mass. Other: Small amount of free fluid in the pelvis, likely physiologic. No free air. Musculoskeletal: No acute bony abnormality. IMPRESSION: Nonvisualization of the appendix. Distention of the stomach with fluid and food debris. Moderate fluid throughout the distal small bowel loops and colon. Findings are nonspecific. This could reflect gastroenteritis. No evidence of bowel obstruction. Electronically Signed   By: KRolm BaptiseM.D.   On: 12/27/2018 21:43    ____________________________________________    PROCEDURES  Procedure(s) performed:    Procedures    Medications  iohexol (OMNIPAQUE) 9 MG/ML oral solution 500 mL (500 mLs Oral Contrast Given 12/27/18 2101)  morphine 4 MG/ML injection 4 mg (has no administration in time range)  morphine 4 MG/ML injection 4 mg (4 mg Intravenous Given 12/27/18 2046)  ondansetron (ZOFRAN) injection 4 mg (4 mg Intravenous Given 12/27/18 2046)  sodium chloride 0.9 % bolus 1,000 mL (1,000 mLs Intravenous New Bag/Given 12/27/18 2046)  iohexol (OMNIPAQUE) 300 MG/ML solution 100 mL (100 mLs Intravenous Contrast Given 12/27/18 2127)  HYDROmorphone (DILAUDID) injection 1 mg (1  mg Intravenous Given 12/27/18 2210)     ____________________________________________   INITIAL IMPRESSION / ASSESSMENT AND PLAN / ED COURSE  Pertinent labs & imaging results that were available during my care of the patient were reviewed by me and considered in my medical decision making (see chart for details).  Review of the [place] CSRS was performed in accordance of the [place] to dispensing any controlled drugs.  Clinical Course as of Nov 17 0000  Mon Dec 27, 2018  2358 Patient presented to the emergency department sharp right lower quadrant pain, nausea vomiting, fevers and chills, anorexia.  Patient was very tender to palpation in the right lower quadrant with positive rebound tenderness, Rovsing's and obturators.  Initial CT was inconclusive as appendix  was not visualized.  MRI was pursued at that time.  Pending final results, patient care will be transferred to attending provider, Dr. Karma Greaser for final diagnosis and disposition.  Patient history, physical exam, labs, and imaging results at this time have been discussed with attending provider.   [JC]    Clinical Course User Index [JC] Joci Dress, Charline Bills, PA-C           This chart was dictated using voice recognition software/Dragon. Despite best efforts to proofread, errors can occur which can change the meaning. Any change was purely unintentional.    Shayan Bramhall, Charline Bills, PA-C 12/28/18 0000    Merlyn Lot, MD 12/28/18 0002

## 2018-12-27 NOTE — ED Triage Notes (Signed)
Awoke at 2AM today with periumbilical pain, NVD. Pt states pain has progressed throughout the day, nausea and emesis have stopped. Pt alert and oriented X4, cooperative, RR even and unlabored, color WNL. Pt in NAD.

## 2018-12-28 ENCOUNTER — Observation Stay: Payer: No Typology Code available for payment source | Admitting: Anesthesiology

## 2018-12-28 ENCOUNTER — Encounter: Payer: Self-pay | Admitting: Internal Medicine

## 2018-12-28 ENCOUNTER — Encounter
Admission: EM | Disposition: A | Discharge status: Home / Self Care | Payer: Self-pay | Source: Home / Self Care | Attending: Surgery

## 2018-12-28 DIAGNOSIS — R109 Unspecified abdominal pain: Secondary | ICD-10-CM | POA: Diagnosis present

## 2018-12-28 DIAGNOSIS — R1031 Right lower quadrant pain: Secondary | ICD-10-CM | POA: Diagnosis not present

## 2018-12-28 HISTORY — PX: XI ROBOT ASSISTED DIAGNOSTIC LAPAROSCOPY: SHX6815

## 2018-12-28 LAB — CBC WITH DIFFERENTIAL/PLATELET
Abs Immature Granulocytes: 0.1 10*3/uL — ABNORMAL HIGH (ref 0.00–0.07)
Basophils Absolute: 0.1 10*3/uL (ref 0.0–0.1)
Basophils Relative: 0 %
Eosinophils Absolute: 0 10*3/uL (ref 0.0–0.5)
Eosinophils Relative: 0 %
HCT: 35.8 % — ABNORMAL LOW (ref 36.0–46.0)
Hemoglobin: 12.6 g/dL (ref 12.0–15.0)
Immature Granulocytes: 1 %
Lymphocytes Relative: 7 %
Lymphs Abs: 1.3 10*3/uL (ref 0.7–4.0)
MCH: 30.1 pg (ref 26.0–34.0)
MCHC: 35.2 g/dL (ref 30.0–36.0)
MCV: 85.6 fL (ref 80.0–100.0)
Monocytes Absolute: 0.6 10*3/uL (ref 0.1–1.0)
Monocytes Relative: 3 %
Neutro Abs: 16.5 10*3/uL — ABNORMAL HIGH (ref 1.7–7.7)
Neutrophils Relative %: 89 %
Platelets: 238 10*3/uL (ref 150–400)
RBC: 4.18 MIL/uL (ref 3.87–5.11)
RDW: 12.7 % (ref 11.5–15.5)
WBC: 18.5 10*3/uL — ABNORMAL HIGH (ref 4.0–10.5)
nRBC: 0 % (ref 0.0–0.2)

## 2018-12-28 LAB — URINE DRUG SCREEN, QUALITATIVE (ARMC ONLY)
Amphetamines, Ur Screen: NOT DETECTED
Barbiturates, Ur Screen: NOT DETECTED
Benzodiazepine, Ur Scrn: NOT DETECTED
Cannabinoid 50 Ng, Ur ~~LOC~~: NOT DETECTED
Cocaine Metabolite,Ur ~~LOC~~: NOT DETECTED
MDMA (Ecstasy)Ur Screen: NOT DETECTED
Methadone Scn, Ur: NOT DETECTED
Opiate, Ur Screen: NOT DETECTED
Phencyclidine (PCP) Ur S: NOT DETECTED
Tricyclic, Ur Screen: NOT DETECTED

## 2018-12-28 LAB — COMPREHENSIVE METABOLIC PANEL
ALT: 14 U/L (ref 0–44)
AST: 19 U/L (ref 15–41)
Albumin: 3.3 g/dL — ABNORMAL LOW (ref 3.5–5.0)
Alkaline Phosphatase: 57 U/L (ref 38–126)
Anion gap: 8 (ref 5–15)
BUN: 7 mg/dL (ref 6–20)
CO2: 21 mmol/L — ABNORMAL LOW (ref 22–32)
Calcium: 8.3 mg/dL — ABNORMAL LOW (ref 8.9–10.3)
Chloride: 108 mmol/L (ref 98–111)
Creatinine, Ser: 0.76 mg/dL (ref 0.44–1.00)
GFR calc Af Amer: >60 mL/min (ref >60)
GFR calc non Af Amer: >60 mL/min (ref >60)
Glucose, Bld: 107 mg/dL — ABNORMAL HIGH (ref 70–99)
Potassium: 3.5 mmol/L (ref 3.5–5.1)
Sodium: 137 mmol/L (ref 135–145)
Total Bilirubin: 0.8 mg/dL (ref 0.3–1.2)
Total Protein: 6.5 g/dL (ref 6.5–8.1)

## 2018-12-28 LAB — SARS CORONAVIRUS 2 (TAT 6-24 HRS): SARS Coronavirus 2: NEGATIVE

## 2018-12-28 SURGERY — LAPAROSCOPY, DIAGNOSTIC, ROBOT-ASSISTED
Anesthesia: General

## 2018-12-28 MED ORDER — HYDROMORPHONE HCL 1 MG/ML IJ SOLN
1.0000 mg | Freq: Once | INTRAMUSCULAR | Status: AC
  Administered 2018-12-28: 1 mg via INTRAVENOUS

## 2018-12-28 MED ORDER — ONDANSETRON HCL 4 MG/2ML IJ SOLN: 4.0000 mg | Freq: Once | INTRAMUSCULAR | Status: DC | PRN

## 2018-12-28 MED ORDER — PROPOFOL 10 MG/ML IV BOLUS
INTRAVENOUS | Status: DC | PRN
  Administered 2018-12-28: 150 mg via INTRAVENOUS

## 2018-12-28 MED ORDER — PIPERACILLIN-TAZOBACTAM 3.375 G IVPB
3.3750 g | Freq: Once | INTRAVENOUS | Status: AC
  Administered 2018-12-28: 09:00:00 3.375 g via INTRAVENOUS
  Filled 2018-12-28: qty 50

## 2018-12-28 MED ORDER — TRAMADOL HCL 50 MG PO TABS: 50.0000 mg | ORAL_TABLET | Freq: Four times a day (QID) | ORAL | Status: DC | PRN

## 2018-12-28 MED ORDER — ACETAMINOPHEN 10 MG/ML IV SOLN
INTRAVENOUS | Status: DC | PRN
  Administered 2018-12-28: 1000 mg via INTRAVENOUS

## 2018-12-28 MED ORDER — ACETAMINOPHEN 10 MG/ML IV SOLN
INTRAVENOUS | Status: AC
  Filled 2018-12-28: qty 100

## 2018-12-28 MED ORDER — ONDANSETRON HCL 4 MG/2ML IJ SOLN
INTRAMUSCULAR | Status: AC
  Filled 2018-12-28: qty 2

## 2018-12-28 MED ORDER — LIDOCAINE HCL (CARDIAC) PF 100 MG/5ML IV SOSY
PREFILLED_SYRINGE | INTRAVENOUS | Status: DC | PRN
  Administered 2018-12-28: 100 mg via INTRAVENOUS

## 2018-12-28 MED ORDER — PANTOPRAZOLE SODIUM 40 MG IV SOLR
40.0000 mg | Freq: Every day | INTRAVENOUS | Status: DC
  Administered 2018-12-28 – 2019-01-04 (×8): 40 mg via INTRAVENOUS
  Filled 2018-12-28 (×8): qty 40

## 2018-12-28 MED ORDER — MORPHINE SULFATE (PF) 4 MG/ML IV SOLN
4.0000 mg | Freq: Once | INTRAVENOUS | Status: AC
  Administered 2018-12-28: 4 mg via INTRAVENOUS
  Filled 2018-12-28: qty 1

## 2018-12-28 MED ORDER — LIDOCAINE HCL (PF) 1 % IJ SOLN
INTRAMUSCULAR | Status: AC
  Filled 2018-12-28: qty 30

## 2018-12-28 MED ORDER — BUPIVACAINE HCL (PF) 0.5 % IJ SOLN
INTRAMUSCULAR | Status: AC
  Filled 2018-12-28: qty 30

## 2018-12-28 MED ORDER — BUPIVACAINE LIPOSOME 1.3 % IJ SUSP
INTRAMUSCULAR | Status: AC
  Filled 2018-12-28: qty 20

## 2018-12-28 MED ORDER — PROMETHAZINE HCL 25 MG/ML IJ SOLN: 6.2500 mg | INTRAMUSCULAR | Status: DC | PRN

## 2018-12-28 MED ORDER — ONDANSETRON HCL 4 MG/2ML IJ SOLN
INTRAMUSCULAR | Status: DC | PRN
  Administered 2018-12-28: 4 mg via INTRAVENOUS

## 2018-12-28 MED ORDER — LIDOCAINE-EPINEPHRINE (PF) 1 %-1:200000 IJ SOLN
INTRAMUSCULAR | Status: DC | PRN
  Administered 2018-12-28: 3.5 mL

## 2018-12-28 MED ORDER — KCL IN DEXTROSE-NACL 20-5-0.9 MEQ/L-%-% IV SOLN
INTRAVENOUS | Status: DC
  Administered 2018-12-28 (×2): via INTRAVENOUS
  Filled 2018-12-28 (×4): qty 1000

## 2018-12-28 MED ORDER — PIPERACILLIN-TAZOBACTAM 3.375 G IVPB
3.3750 g | Freq: Three times a day (TID) | INTRAVENOUS | Status: DC
  Administered 2018-12-28 – 2019-01-04 (×20): 3.375 g via INTRAVENOUS
  Filled 2018-12-28 (×19): qty 50

## 2018-12-28 MED ORDER — MIDAZOLAM HCL 2 MG/2ML IJ SOLN
INTRAMUSCULAR | Status: AC
  Filled 2018-12-28: qty 2

## 2018-12-28 MED ORDER — SODIUM CHLORIDE 0.9 % IV BOLUS
1000.0000 mL | Freq: Once | INTRAVENOUS | Status: AC
  Administered 2018-12-28: 1000 mL via INTRAVENOUS

## 2018-12-28 MED ORDER — SUGAMMADEX SODIUM 200 MG/2ML IV SOLN
INTRAVENOUS | Status: DC | PRN
  Administered 2018-12-28: 160 mg via INTRAVENOUS

## 2018-12-28 MED ORDER — ONDANSETRON HCL 4 MG/2ML IJ SOLN
4.0000 mg | Freq: Four times a day (QID) | INTRAMUSCULAR | Status: DC | PRN
  Administered 2018-12-30 – 2019-01-03 (×12): 4 mg via INTRAVENOUS
  Filled 2018-12-28 (×12): qty 2

## 2018-12-28 MED ORDER — LACTATED RINGERS IV SOLN
INTRAVENOUS | Status: DC
  Administered 2018-12-28 (×2): via INTRAVENOUS

## 2018-12-28 MED ORDER — FENTANYL CITRATE (PF) 100 MCG/2ML IJ SOLN
25.0000 ug | INTRAMUSCULAR | Status: AC | PRN
  Administered 2018-12-28 (×6): 25 ug via INTRAVENOUS

## 2018-12-28 MED ORDER — FENTANYL CITRATE (PF) 250 MCG/5ML IJ SOLN
INTRAMUSCULAR | Status: AC
  Filled 2018-12-28: qty 5

## 2018-12-28 MED ORDER — DEXTROSE-NACL 5-0.9 % IV SOLN
INTRAVENOUS | Status: DC
  Administered 2018-12-28: 05:00:00 via INTRAVENOUS

## 2018-12-28 MED ORDER — SODIUM CHLORIDE 0.9 % IV SOLN
INTRAVENOUS | Status: DC | PRN
  Administered 2018-12-28: 20 mL

## 2018-12-28 MED ORDER — ACETAMINOPHEN 325 MG PO TABS
650.0000 mg | ORAL_TABLET | Freq: Four times a day (QID) | ORAL | Status: DC | PRN
  Administered 2018-12-28: 650 mg via ORAL
  Filled 2018-12-28: qty 2

## 2018-12-28 MED ORDER — ENOXAPARIN SODIUM 40 MG/0.4ML ~~LOC~~ SOLN
40.0000 mg | SUBCUTANEOUS | Status: DC
  Administered 2018-12-29 – 2018-12-30 (×2): 40 mg via SUBCUTANEOUS
  Filled 2018-12-28 (×2): qty 0.4

## 2018-12-28 MED ORDER — FENTANYL CITRATE (PF) 100 MCG/2ML IJ SOLN
INTRAMUSCULAR | Status: AC
  Administered 2018-12-28: 25 ug via INTRAVENOUS
  Filled 2018-12-28: qty 2

## 2018-12-28 MED ORDER — FENTANYL CITRATE (PF) 100 MCG/2ML IJ SOLN
INTRAMUSCULAR | Status: DC | PRN
  Administered 2018-12-28 (×9): 50 ug via INTRAVENOUS

## 2018-12-28 MED ORDER — MIDAZOLAM HCL 2 MG/2ML IJ SOLN
INTRAMUSCULAR | Status: DC | PRN
  Administered 2018-12-28: 2 mg via INTRAVENOUS

## 2018-12-28 MED ORDER — DEXAMETHASONE SODIUM PHOSPHATE 10 MG/ML IJ SOLN
INTRAMUSCULAR | Status: DC | PRN
  Administered 2018-12-28: 10 mg via INTRAVENOUS

## 2018-12-28 MED ORDER — KETOROLAC TROMETHAMINE 30 MG/ML IJ SOLN
30.0000 mg | Freq: Four times a day (QID) | INTRAMUSCULAR | Status: DC | PRN
  Administered 2018-12-29 – 2018-12-30 (×3): 30 mg via INTRAVENOUS
  Filled 2018-12-28 (×3): qty 1

## 2018-12-28 MED ORDER — INDOCYANINE GREEN 25 MG IV SOLR: 2.5000 mg | Freq: Once | INTRAVENOUS | Status: DC

## 2018-12-28 MED ORDER — FENTANYL CITRATE (PF) 100 MCG/2ML IJ SOLN
INTRAMUSCULAR | Status: AC
  Administered 2018-12-28: 22:00:00
  Filled 2018-12-28: qty 2

## 2018-12-28 MED ORDER — HYDROMORPHONE HCL 1 MG/ML IJ SOLN
0.5000 mg | INTRAMUSCULAR | Status: DC | PRN
  Administered 2018-12-28 – 2018-12-30 (×9): 0.5 mg via INTRAVENOUS
  Filled 2018-12-28 (×10): qty 0.5

## 2018-12-28 MED ORDER — EPINEPHRINE PF 1 MG/ML IJ SOLN
INTRAMUSCULAR | Status: AC
  Filled 2018-12-28: qty 1

## 2018-12-28 MED ORDER — BUPIVACAINE HCL (PF) 0.5 % IJ SOLN
INTRAMUSCULAR | Status: DC | PRN
  Administered 2018-12-28: 3.5 mL

## 2018-12-28 MED ORDER — PROPOFOL 10 MG/ML IV BOLUS
INTRAVENOUS | Status: AC
  Filled 2018-12-28: qty 20

## 2018-12-28 MED ORDER — INDOCYANINE GREEN 25 MG IV SOLR
INTRAVENOUS | Status: DC | PRN
  Administered 2018-12-28: 7.5 mg via INTRAVENOUS

## 2018-12-28 MED ORDER — FENTANYL CITRATE (PF) 100 MCG/2ML IJ SOLN
INTRAMUSCULAR | Status: AC
  Filled 2018-12-28: qty 2

## 2018-12-28 MED ORDER — ROCURONIUM BROMIDE 50 MG/5ML IV SOLN
INTRAVENOUS | Status: AC
  Filled 2018-12-28: qty 1

## 2018-12-28 MED ORDER — SODIUM CHLORIDE (PF) 0.9 % IJ SOLN
INTRAMUSCULAR | Status: AC
  Filled 2018-12-28: qty 10

## 2018-12-28 MED ORDER — LIDOCAINE HCL (PF) 2 % IJ SOLN
INTRAMUSCULAR | Status: AC
  Filled 2018-12-28: qty 10

## 2018-12-28 MED ORDER — ONDANSETRON HCL 4 MG PO TABS
4.0000 mg | ORAL_TABLET | Freq: Four times a day (QID) | ORAL | Status: DC | PRN
  Administered 2019-01-02 – 2019-01-03 (×3): 4 mg via ORAL
  Filled 2018-12-28 (×3): qty 1

## 2018-12-28 MED ORDER — HYDROMORPHONE HCL 1 MG/ML IJ SOLN
INTRAMUSCULAR | Status: AC
  Filled 2018-12-28: qty 1

## 2018-12-28 MED ORDER — ACETAMINOPHEN 650 MG RE SUPP: 650.0000 mg | Freq: Four times a day (QID) | RECTAL | Status: DC | PRN

## 2018-12-28 MED ORDER — HYDROMORPHONE HCL 1 MG/ML IJ SOLN
1.0000 mg | INTRAMUSCULAR | Status: DC | PRN
  Administered 2018-12-28 (×4): 1 mg via INTRAVENOUS
  Filled 2018-12-28 (×4): qty 1

## 2018-12-28 MED ORDER — PIPERACILLIN-TAZOBACTAM 3.375 G IVPB
INTRAVENOUS | Status: AC
  Filled 2018-12-28: qty 50

## 2018-12-28 MED ORDER — ROCURONIUM BROMIDE 100 MG/10ML IV SOLN
INTRAVENOUS | Status: DC | PRN
  Administered 2018-12-28: 20 mg via INTRAVENOUS
  Administered 2018-12-28: 50 mg via INTRAVENOUS
  Administered 2018-12-28: 20 mg via INTRAVENOUS

## 2018-12-28 MED ORDER — CHLORHEXIDINE GLUCONATE CLOTH 2 % EX PADS: 6.0000 | MEDICATED_PAD | Freq: Every day | CUTANEOUS | Status: DC

## 2018-12-28 MED ORDER — SODIUM CHLORIDE (PF) 0.9 % IJ SOLN
INTRAMUSCULAR | Status: AC
  Filled 2018-12-28: qty 50

## 2018-12-28 SURGICAL SUPPLY — 94 items
ADH SKN CLS APL DERMABOND .7 (GAUZE/BANDAGES/DRESSINGS) ×1
APL PRP STRL LF DISP 70% ISPRP (MISCELLANEOUS) ×1
BAG INFUSER PRESSURE 100CC (MISCELLANEOUS) ×2 IMPLANT
BLADE SURG SZ11 CARB STEEL (BLADE) ×3 IMPLANT
CANISTER SUCT 1200ML W/VALVE (MISCELLANEOUS) ×3 IMPLANT
CANNULA REDUC XI 12-8 STAPL (CANNULA) ×1
CANNULA REDUC XI 12-8MM STAPL (CANNULA) ×1
CANNULA REDUCER 12-8 DVNC XI (CANNULA) ×1 IMPLANT
CHLORAPREP W/TINT 26 (MISCELLANEOUS) ×3 IMPLANT
COVER TIP SHEARS 8 DVNC (MISCELLANEOUS) ×1 IMPLANT
COVER TIP SHEARS 8MM DA VINCI (MISCELLANEOUS) ×2
COVER WAND RF STERILE (DRAPES) ×2 IMPLANT
DEFOGGER SCOPE WARMER CLEARIFY (MISCELLANEOUS) ×3 IMPLANT
DERMABOND ADVANCED (GAUZE/BANDAGES/DRESSINGS) ×2
DERMABOND ADVANCED .7 DNX12 (GAUZE/BANDAGES/DRESSINGS) ×1 IMPLANT
DRAPE 3/4 80X56 (DRAPES) ×3 IMPLANT
DRAPE ARM DVNC X/XI (DISPOSABLE) ×4 IMPLANT
DRAPE COLUMN DVNC XI (DISPOSABLE) ×1 IMPLANT
DRAPE DA VINCI XI ARM (DISPOSABLE) ×8
DRAPE DA VINCI XI COLUMN (DISPOSABLE) ×2
DRSG OPSITE POSTOP 4X8 (GAUZE/BANDAGES/DRESSINGS) ×2 IMPLANT
ELECT CAUTERY BLADE 6.4 (BLADE) IMPLANT
ELECT REM PT RETURN 9FT ADLT (ELECTROSURGICAL) ×3
ELECTRODE REM PT RTRN 9FT ADLT (ELECTROSURGICAL) ×1 IMPLANT
GLOVE BIOGEL PI IND STRL 7.0 (GLOVE) ×2 IMPLANT
GLOVE BIOGEL PI INDICATOR 7.0 (GLOVE) ×4
GLOVE SURG SYN 6.5 ES PF (GLOVE) ×6 IMPLANT
GLOVE SURG SYN 6.5 PF PI (GLOVE) ×2 IMPLANT
GOWN STRL REUS W/ TWL LRG LVL3 (GOWN DISPOSABLE) ×3 IMPLANT
GOWN STRL REUS W/TWL LRG LVL3 (GOWN DISPOSABLE) ×9
HANDLE YANKAUER SUCT BULB TIP (MISCELLANEOUS) ×2 IMPLANT
IRRIGATOR SUCT 8 DISP DVNC XI (IRRIGATION / IRRIGATOR) IMPLANT
IRRIGATOR SUCTION 8MM XI DISP (IRRIGATION / IRRIGATOR) ×2
IV NS 1000ML (IV SOLUTION) ×3
IV NS 1000ML BAXH (IV SOLUTION) IMPLANT
JACKSON PRATT 7MM (INSTRUMENTS) ×2 IMPLANT
KIT IMAGING PINPOINTPAQ (MISCELLANEOUS) ×2 IMPLANT
KIT PINK PAD W/HEAD ARE REST (MISCELLANEOUS) ×3
KIT PINK PAD W/HEAD ARM REST (MISCELLANEOUS) ×1 IMPLANT
LABEL OR SOLS (LABEL) IMPLANT
NDL INSUFFLATION 14GA 120MM (NEEDLE) IMPLANT
NEEDLE HYPO 22GX1.5 SAFETY (NEEDLE) ×5 IMPLANT
NEEDLE INSUFFLATION 14GA 120MM (NEEDLE) ×3 IMPLANT
NEEDLE VERESS 14GA 120MM (NEEDLE) ×3 IMPLANT
OBTURATOR OPTICAL STANDARD 8MM (TROCAR) ×2
OBTURATOR OPTICAL STND 8 DVNC (TROCAR) ×1
OBTURATOR OPTICALSTD 8 DVNC (TROCAR) ×1 IMPLANT
PACK LAP CHOLECYSTECTOMY (MISCELLANEOUS) ×3 IMPLANT
PENCIL ELECTRO HAND CTR (MISCELLANEOUS) ×3 IMPLANT
RELOAD STAPLE 45 2.5 WHT DVNC (STAPLE) ×1 IMPLANT
RELOAD STAPLE 45 3.5 BLU DVNC (STAPLE) ×1 IMPLANT
RELOAD STAPLE 60 3.5 BLU DVNC (STAPLE) IMPLANT
RELOAD STAPLER 2.5X45 WHT DVNC (STAPLE) IMPLANT
RELOAD STAPLER 3.5X45 BLU DVNC (STAPLE) IMPLANT
RELOAD STAPLER 3.5X60 BLU DVNC (STAPLE) ×3 IMPLANT
RETRACTOR WOUND ALXS 18CM MED (MISCELLANEOUS) IMPLANT
RTRCTR WOUND ALEXIS O 18CM MED (MISCELLANEOUS) ×3
SEAL CANN UNIV 5-8 DVNC XI (MISCELLANEOUS) ×3 IMPLANT
SEAL XI 5MM-8MM UNIVERSAL (MISCELLANEOUS) ×6
SEALER VESSEL DA VINCI XI (MISCELLANEOUS) ×2
SEALER VESSEL EXT DVNC XI (MISCELLANEOUS) IMPLANT
SOLUTION ELECTROLUBE (MISCELLANEOUS) ×3 IMPLANT
SPONGE DRAIN TRACH 4X4 STRL 2S (GAUZE/BANDAGES/DRESSINGS) ×2 IMPLANT
SPONGE LAP 18X18 RF (DISPOSABLE) ×2 IMPLANT
STAPLER 45 DA VINCI SURE FORM (STAPLE)
STAPLER 45 SUREFORM DVNC (STAPLE) ×1 IMPLANT
STAPLER 60 DA VINCI SURE FORM (STAPLE) ×2
STAPLER 60 SUREFORM DVNC (STAPLE) IMPLANT
STAPLER CANNULA SEAL DVNC XI (STAPLE) ×1 IMPLANT
STAPLER CANNULA SEAL XI (STAPLE) ×2
STAPLER GUN LINEAR PROX 60 (STAPLE) ×2 IMPLANT
STAPLER PROXIMATE 75MM BLUE (STAPLE) ×2 IMPLANT
STAPLER RELOAD 2.5X45 WHITE (STAPLE)
STAPLER RELOAD 2.5X45 WHT DVNC (STAPLE)
STAPLER RELOAD 3.5X45 BLU DVNC (STAPLE)
STAPLER RELOAD 3.5X45 BLUE (STAPLE)
STAPLER RELOAD 3.5X60 BLU DVNC (STAPLE) ×3
STAPLER RELOAD 3.5X60 BLUE (STAPLE) ×6
STAPLER SKIN PROX 35W (STAPLE) ×2 IMPLANT
SUT MNCRL 4-0 (SUTURE) ×6
SUT MNCRL 4-0 27XMFL (SUTURE) ×2
SUT MNCRL AB 4-0 PS2 18 (SUTURE) ×3 IMPLANT
SUT PDS AB 1 CT1 27 (SUTURE) ×6 IMPLANT
SUT SILK 3 0 SH 30 (SUTURE) ×6 IMPLANT
SUT VIC AB 2-0 SH 27 (SUTURE) ×6
SUT VIC AB 2-0 SH 27XBRD (SUTURE) ×1 IMPLANT
SUT VIC AB 3-0 SH 27 (SUTURE) ×3
SUT VIC AB 3-0 SH 27X BRD (SUTURE) ×1 IMPLANT
SUT VICRYL 0 AB UR-6 (SUTURE) ×3 IMPLANT
SUTURE MNCRL 4-0 27XMF (SUTURE) IMPLANT
SYR 20ML LL LF (SYRINGE) ×4 IMPLANT
SYR 30ML LL (SYRINGE) ×3 IMPLANT
TRAY FOLEY MTR SLVR 16FR STAT (SET/KITS/TRAYS/PACK) ×3 IMPLANT
TUBING EVAC SMOKE HEATED PNEUM (TUBING) ×3 IMPLANT

## 2018-12-28 NOTE — Progress Notes (Signed)
Patient arrived to room in extreme pain (10/10RLQ) despite already having 29m of morphine 20 mins prior. Ouma-NP on call notified. She ordered 1MG IV dilaudid. Patient still screaming out in pain.

## 2018-12-28 NOTE — H&P (Addendum)
History and Physical    Paula Cooley QZR:[id number] DOB: 1990-07-07 DOA: 12/27/2018  PCP: Denita Lung, MD  Patient coming from: Home.  Chief Complaint: Abdominal pain with nausea vomiting.  HPI: Paula Cooley is a 28 y.o. female with history of asthma presents to the ER with complaints of abdominal pain nausea vomiting and diarrhea.  Patient symptoms started 24 hours ago.  Pain is mostly in the right lower quadrant.  Has had multiple episodes of nausea vomiting and diarrhea.  Denies any blood in the vomitus or diarrhea but denies any recent travel sick contacts or eating any abnormal food.  Has not had similar episodes previously and has not had any chronic diarrhea.  Has not used any antibiotic recently.  No fever or chills.  Denies any joint pain or skin rash.  ED Course: In the ER patient has been having significant abdominal pain requiring multiple doses of narcotics.  Pain is mostly in the right lower quadrant.  CT abdomen pelvis done showed fluid-filled colon concerning for gastroenteritis.  Since there was some concern for appendicitis MRI of the abdomen and pelvis was done.  Official results are yet pending but the ER physician discussed with radiologist and there was some concern for terminal ileitis.  Given the persistent pain patient admitted for further management.  Labs reveal creatinine 1.8 LFTs normal WBC 14.5 platelets 265 hemoglobin 13.5 COVID-19 test is pending.  Lipase 32.  Review of Systems: As per HPI, rest all negative.   Past Medical History:  Diagnosis Date   Acetabular labrum tear    R shoulder    Acne    Asthma    exercise induced, no problems in quite a while, last use of inhaler- >one year   Hypoglycemia    uses glucose tablets PRN   Seasonal allergies     Past Surgical History:  Procedure Laterality Date   ROTATOR CUFF REPAIR Right 07/2011   as also bicep tendon surgery   SHOULDER ARTHROSCOPY WITH LABRAL REPAIR Right 01/01/2016   Procedure: SHOULDER ARTHROSCOPY WITH LABRAL (SLAP) REPAIR;  Surgeon: Meredith Pel, MD;  Location: [employer];  Service: Orthopedics;  Laterality: Right;   WISDOM TOOTH EXTRACTION       reports that she quit smoking about 4 years ago. She has never used smokeless tobacco. She reports current alcohol use. She reports that she does not use drugs.  Allergies  Allergen Reactions   No Known Allergies     Family History  Problem Relation Age of Onset   Hypertension Mother    Heart disease Father 53       MI; s/p CABG   Diabetes Father        resolved after weight loss   Cancer Paternal Grandmother        lung    Prior to Admission medications   Medication Sig Start Date End Date Taking? Authorizing Provider  amphetamine-dextroamphetamine (ADDERALL) 20 MG tablet Take 1 tablet (20 mg total) by mouth 2 (two) times daily. 11/18/18  Yes Denita Lung, MD  ZOVIA 1/35E, 28, 1-35 MG-MCG tablet Take 1 tablet by mouth daily. 11/09/18  Yes [provider]  acetaminophen (TYLENOL) 500 MG tablet Take 1,500 mg by mouth every 6 (six) hours as needed.    [provider]  albuterol (PROVENTIL HFA;VENTOLIN HFA) 108 (90 BASE) MCG/ACT inhaler Inhale 2 puffs into the lungs every 6 (six) hours as needed for wheezing. 07/13/13   Rita Ohara, MD  amphetamine-dextroamphetamine (ADDERALL)  20 MG tablet Take 1 tablet (20 mg total) by mouth 2 (two) times daily. 01/18/19   Denita Lung, MD  amphetamine-dextroamphetamine (ADDERALL) 20 MG tablet Take 1 tablet (20 mg total) by mouth 2 (two) times daily. 12/19/18   Denita Lung, MD  diclofenac sodium (VOLTAREN) 1 % GEL Apply 4 g topically 4 (four) times daily as needed. 08/31/18   Hilts, Legrand Como, MD  estradiol (ESTRACE) 1 MG tablet Take 1 mg by mouth daily. 08/24/18   [provider]  glucose 4 GM chewable tablet Chew 3-4 tablets by mouth as needed for low blood sugar.    [provider]  levocetirizine (XYZAL ALLERGY 24HR) 5 MG  tablet Take 5 mg by mouth every evening.    [provider]  Multiple Vitamins-Minerals (MULTIVITAMIN WITH MINERALS) tablet Take 1 tablet by mouth daily.    [provider]  naproxen sodium (ANAPROX) 220 MG tablet Take 880 mg by mouth 2 (two) times daily with a meal.    [provider]    Physical Exam: Constitutional: Moderately built and nourished. Vitals:   12/27/18 1826 12/28/18 0248  BP: 125/86 125/76  Pulse: (!) 120 (!) 105  Resp: 16 20  Temp: 99.2 F (37.3 C) 99.8 F (37.7 C)  TempSrc: Oral Oral  SpO2: 100% 98%  Weight: 77.1 kg   Height: 5' 6"  (1.676 m)    Eyes: Anicteric no pallor. ENMT: No discharge from the ears eyes nose or mouth. Neck: No mass or.  No neck rigidity. Respiratory: No rhonchi or crepitations. Cardiovascular: S1-S2 heard. Abdomen: Tenderness in the right lower quadrant no guarding or rigidity. Musculoskeletal: No edema. Skin: No rash. Neurologic: Alert awake oriented time place and person.  Moves all extremities. Psychiatric: Appears normal per normal affect.   Labs on Admission: I have personally reviewed following labs and imaging studies  CBC: Recent Labs  Lab 12/27/18 1829  WBC 14.5*  HGB 13.5  HCT 39.3  MCV 88.9  PLT 315   Basic Metabolic Panel: Recent Labs  Lab 12/27/18 1829  NA 139  K 3.8  CL 105  CO2 23  GLUCOSE 123*  BUN 11  CREATININE 0.80  CALCIUM 9.1   GFR: Estimated Creatinine Clearance: 110.7 mL/min (by C-G formula based on SCr of 0.8 mg/dL). Liver Function Tests: Recent Labs  Lab 12/27/18 1829  AST 18  ALT 14  ALKPHOS 68  BILITOT 0.6  PROT 7.3  ALBUMIN 3.7   Recent Labs  Lab 12/27/18 1829  LIPASE 32   No results for input(s): AMMONIA in the last 168 hours. Coagulation Profile: No results for input(s): INR, PROTIME in the last 168 hours. Cardiac Enzymes: No results for input(s): CKTOTAL, CKMB, CKMBINDEX, TROPONINI in the last 168 hours. BNP (last 3 results) No results for  input(s): PROBNP in the last 8760 hours. HbA1C: No results for input(s): HGBA1C in the last 72 hours. CBG: No results for input(s): GLUCAP in the last 168 hours. Lipid Profile: No results for input(s): CHOL, HDL, LDLCALC, TRIG, CHOLHDL, LDLDIRECT in the last 72 hours. Thyroid Function Tests: No results for input(s): TSH, T4TOTAL, FREET4, T3FREE, THYROIDAB in the last 72 hours. Anemia Panel: No results for input(s): VITAMINB12, FOLATE, FERRITIN, TIBC, IRON, RETICCTPCT in the last 72 hours. Urine analysis:    Component Value Date/Time   COLORURINE YELLOW (A) 12/27/2018 1829   APPEARANCEUR CLOUDY (A) 12/27/2018 1829   LABSPEC 1.015 12/27/2018 1829   PHURINE 6.0 12/27/2018 [place] 12/27/2018 1829  HGBUR NEGATIVE 12/27/2018 [place] 12/27/2018 1829   BILIRUBINUR neg 09/06/2012 1355   [place] 12/27/2018 [place] NEGATIVE 12/27/2018 1829   UROBILINOGEN negative 09/06/2012 1355   NITR[place] NEGATIVE 12/27/2018 1829   LEUKOCYTESUR LARGE (A) 12/27/2018 1829   Sepsis Labs: @LABRCNTIP (procalcitonin:4,lacticidven:4) )No results found for this or any previous visit (from the past 240 hour(s)).   Radiological Exams on Admission: Ct Abdomen Pelvis W Contrast  Result Date: 12/27/2018 CLINICAL DATA:  Abdominal pain, right lower quadrant pain, nausea, vomiting EXAM: CT ABDOMEN AND PELVIS WITH CONTRAST TECHNIQUE: Multidetector CT imaging of the abdomen and pelvis was performed using the standard protocol following bolus administration of intravenous contrast. CONTRAST:  16m OMNIPAQUE IOHEXOL 300 MG/ML  SOLN COMPARISON:  None. FINDINGS: Lower chest: Lung bases are clear. No effusions. Heart is normal size. Hepatobiliary: No focal hepatic abnormality. Gallbladder unremarkable. Pancreas: No focal abnormality or ductal dilatation. Spleen: No focal abnormality.  Normal size. Adrenals/Urinary Tract: No adrenal abnormality. No focal renal abnormality.  No stones or hydronephrosis. Urinary bladder is unremarkable. Stomach/Bowel: Stomach is distended with fluid and debris. Mild distention of the colon with fluid. Distal small bowel loops are fluid-filled. No bowel distention to suggest abscess. Appendix not definitively seen. Vascular/Lymphatic: No evidence of aneurysm or adenopathy. Reproductive: Uterus and adnexa unremarkable.  No mass. Other: Small amount of free fluid in the pelvis, likely physiologic. No free air. Musculoskeletal: No acute bony abnormality. IMPRESSION: Nonvisualization of the appendix. Distention of the stomach with fluid and food debris. Moderate fluid throughout the distal small bowel loops and colon. Findings are nonspecific. This could reflect gastroenteritis. No evidence of bowel obstruction. Electronically Signed   By: KRolm BaptiseM.D.   On: 12/27/2018 21:43     Assessment/Plan Principal Problem:   Abdominal pain    1. Abdominal pain with a intractable nausea vomiting and diarrhea - official MRI abdomen and pelvis results are pending.  Per ER physician who discussed with the radiologist the results are indicative of possible terminal ileitis.  Differentials include either inflammatory bowel disease or gastroenteritis.  We will keep patient n.p.o. IV fluids pain relief medication consult gastroenterologist. 2. History of asthma presently not wheezing.  COVID-19 test is pending.   DVT prophylaxis: SCDs. Code Status: Full code. Family Communication: No family at the bedside. Disposition Plan: Home. Consults called: Gastroenterology. Admission status: Observation.   ARise PatienceMD [facility] Pager [phone]  If 7PM-7AM, please contact night-coverage [email or web address] Password [email or web address]  12/28/2018, 4:08 AM

## 2018-12-28 NOTE — Progress Notes (Signed)
Patient ID: Paula Cooley, female   DOB: 1990/07/30, 28 y.o.   MRN: [id number]  Called with mri results of possible appendicitis.  Spoke with Dr Lysle Pearl general surgery to evaluate.  Added potassium to fluids. Added zosyn.  Patient NPO.  Covid pending.   Dr Leslye Peer

## 2018-12-28 NOTE — Progress Notes (Signed)
Patient is now resting in bed after administration of dilaudid.

## 2018-12-28 NOTE — ED Notes (Signed)
Orderly here to transport patient

## 2018-12-28 NOTE — Progress Notes (Signed)
Patient ID: Paula Cooley, female   DOB: 15-Mar-1990, 28 y.o.   MRN: [id number] [facility] PROGRESS NOTE  Paula Cooley TKZ:[id number] DOB: 16-Mar-1990 DOA: 12/27/2018 PCP: Denita Lung, MD  HPI/Subjective: Patient on Monday started developing abdominal pain at the bellybutton.  Some nausea vomiting.  Had diarrhea the other day.  MRI concerning for acute appendicitis.  Objective: Vitals:   12/28/18 1107 12/28/18 1129  BP: 97/68 103/64  Pulse: 69 95  Resp: 16 18  Temp:  99.7 F (37.6 C)  SpO2: 97% 95%   No intake or output data in the 24 hours ending 12/28/18 1503 Filed Weights   12/27/18 1826  Weight: 77.1 kg    ROS: Review of Systems  Constitutional: Negative for chills and fever.  Eyes: Negative for blurred vision.  Respiratory: Negative for cough and shortness of breath.   Cardiovascular: Negative for chest pain.  Gastrointestinal: Positive for abdominal pain, nausea and vomiting. Negative for constipation and diarrhea.  Genitourinary: Negative for dysuria.  Musculoskeletal: Negative for joint pain.  Neurological: Negative for dizziness and headaches.   Exam: Physical Exam  Constitutional: She is oriented to person, place, and time.  HENT:  Nose: No mucosal edema.  Mouth/Throat: No oropharyngeal exudate or posterior oropharyngeal edema.  Eyes: Pupils are equal, round, and reactive to light. Conjunctivae, EOM and lids are normal.  Neck: No JVD present. Carotid bruit is not present. No edema present. No thyroid mass and no thyromegaly present.  Cardiovascular: S1 normal and S2 normal. Exam reveals no gallop.  No murmur heard. Pulses:      Dorsalis pedis pulses are 2+ on the right side and 2+ on the left side.  Respiratory: No respiratory distress. She has no wheezes. She has no rhonchi. She has no rales.  GI: Soft. Bowel sounds are normal. There is abdominal tenderness in the right lower quadrant, periumbilical area and suprapubic area.  Musculoskeletal:      Right ankle: She exhibits no swelling.     Left ankle: She exhibits no swelling.  Lymphadenopathy:    She has no cervical adenopathy.  Neurological: She is alert and oriented to person, place, and time. No cranial nerve deficit.  Skin: Skin is warm. No rash noted. Nails show no clubbing.  Psychiatric: She has a normal mood and affect.      Data Reviewed: Basic Metabolic Panel: Recent Labs  Lab 12/27/18 1829 12/28/18 0456  NA 139 137  K 3.8 3.5  CL 105 108  CO2 23 21*  GLUCOSE 123* 107*  BUN 11 7  CREATININE 0.80 0.76  CALCIUM 9.1 8.3*   Liver Function Tests: Recent Labs  Lab 12/27/18 1829 12/28/18 0456  AST 18 19  ALT 14 14  ALKPHOS 68 57  BILITOT 0.6 0.8  PROT 7.3 6.5  ALBUMIN 3.7 3.3*   Recent Labs  Lab 12/27/18 1829  LIPASE 32   CBC: Recent Labs  Lab 12/27/18 1829 12/28/18 0456  WBC 14.5* 18.5*  NEUTROABS  --  16.5*  HGB 13.5 12.6  HCT 39.3 35.8*  MCV 88.9 85.6  PLT 264 238     Recent Results (from the past 240 hour(s))  SARS CORONAVIRUS 2 (TAT 6-24 HRS) Nasopharyngeal Nasopharyngeal Swab     Status: None   Collection Time: 12/28/18  2:23 AM   Specimen: Nasopharyngeal Swab  Result Value Ref Range Status   SARS Coronavirus 2 NEGATIVE NEGATIVE Final    Comment: (NOTE) SARS-CoV-2 target nucleic acids are NOT DETECTED. The SARS-CoV-2  RNA is generally detectable in upper and lower respiratory specimens during the acute phase of infection. Negative results do not preclude SARS-CoV-2 infection, do not rule out co-infections with other pathogens, and should not be used as the sole basis for treatment or other patient management decisions. Negative results must be combined with clinical observations, patient history, and epidemiological information. The expected result is Negative. Fact Sheet for Patients: SugarRoll.be Fact Sheet for Healthcare Providers: https://www.woods-mathews.com/ This test is not yet  approved or cleared by the [place] FDA and  has been authorized for detection and/or diagnosis of SARS-CoV-2 by FDA under an Emergency Use Authorization (EUA). This EUA will remain  in effect (meaning this test can be used) for the duration of the COVID-19 declaration under Section 56 4(b)(1) of the Act, 21 U.S.C. section 360bbb-3(b)(1), unless the authorization is terminated or revoked sooner. Performed at [facility], [place] [address] [place], [place] [address]      Studies: Mr Pelvis Wo Contrast  Result Date: 12/28/2018 CLINICAL DATA:  Abdominal pain, appendicitis suspected. RIGHT lower quadrant pain. RIGHT lower quadrant tended to palpation. Elevated white blood cell count. Is EXAM: MRI ABDOMEN AND PELVIS WITHOUT CONTRAST TECHNIQUE: Multiplanar multisequence MR imaging of the abdomen and pelvis was performed. No intravenous contrast was administered. COMPARISON:  CT 12/27/2018 (same day) FINDINGS: COMBINED FINDINGS FOR BOTH MR ABDOMEN AND PELVIS Lower chest: Lung bases are clear. Hepatobiliary: No focal hepatic lesion. No biliary duct dilatation. Gallbladder is normal. Common bile duct is normal. Pancreas: Pancreas is normal. No ductal dilatation. No pancreatic inflammation. Spleen: Normal spleen Adrenals/urinary tract: Adrenal glands and kidneys are normal. The ureters and bladder normal. Stomach/Bowel: Stomach is distended by gastric contents. No obstructing lesion identified. Proximal small bowel duodenum are normal. The terminal ileum is normal. The more distal ileum proximal terminal ileum is poorly defined within the RIGHT lower quadrant with suggestive of edema/inflammation (image 57/18). There is a tubular structure in the RIGHT lower quadrant measuring 11 mm diameter (image series 18). Small amount fluid within centrally within this structure. Difficult to ascertain if this is loop of small bowel or the appendix. In comparison to CT ,there is a a tubular structure in the  same vicinity best seen on coronal image 33/5. There is submucosal edema within this tubular structure and enhancing mucosa. This is felt separate from nearby ovary. Normal ovary is in the same region on MRI coronal image 33 of/series 15 . There is small amount of free fluid in the RIGHT pelvis. The more distal colon is normal.  Rectosigmoid colon normal. Vascular/Lymphatic: Abdominal aorta is normal caliber. No periportal or retroperitoneal adenopathy. No pelvic adenopathy. Reproductive: Uterus and ovaries appear normal. Small RIGHT ovary measuring 1.7 x 1.0 cm present image 62/18. Normal LEFT ovary on image 62/18 Other: Small amount free fluid in the RIGHT pelvis Musculoskeletal: . IMPRESSION: 1. In comparing present MRI and same day CT, there is a tubular structure in the RIGHT lower quadrant with mucosal enhancement and submucosal edema. Small amount free fluid the deep RIGHT pelvis. Differential would include ACUTE APPENDICITIS, Meckel's diverticulum, or inflamed loop of small bowel (enteritis). As this is equivocal case, recommend surgical consultation for further evaluation. 2. Mild inflammation within distal ileum.  No abscess formation. 3. Ovaries are normal. Findings conveyed toDoug Annabell Howells 12/28/2018  at08:32. Electronically Signed   By: Suzy Bouchard M.D.   On: 12/28/2018 08:32   Mr Abdomen Wo Contrast  Result Date: 12/28/2018 CLINICAL DATA:  Abdominal pain,  appendicitis suspected. RIGHT lower quadrant pain. RIGHT lower quadrant tended to palpation. Elevated white blood cell count. Is EXAM: MRI ABDOMEN AND PELVIS WITHOUT CONTRAST TECHNIQUE: Multiplanar multisequence MR imaging of the abdomen and pelvis was performed. No intravenous contrast was administered. COMPARISON:  CT 12/27/2018 (same day) FINDINGS: COMBINED FINDINGS FOR BOTH MR ABDOMEN AND PELVIS Lower chest: Lung bases are clear. Hepatobiliary: No focal hepatic lesion. No biliary duct dilatation. Gallbladder is normal. Common bile  duct is normal. Pancreas: Pancreas is normal. No ductal dilatation. No pancreatic inflammation. Spleen: Normal spleen Adrenals/urinary tract: Adrenal glands and kidneys are normal. The ureters and bladder normal. Stomach/Bowel: Stomach is distended by gastric contents. No obstructing lesion identified. Proximal small bowel duodenum are normal. The terminal ileum is normal. The more distal ileum proximal terminal ileum is poorly defined within the RIGHT lower quadrant with suggestive of edema/inflammation (image 57/18). There is a tubular structure in the RIGHT lower quadrant measuring 11 mm diameter (image series 18). Small amount fluid within centrally within this structure. Difficult to ascertain if this is loop of small bowel or the appendix. In comparison to CT ,there is a a tubular structure in the same vicinity best seen on coronal image 33/5. There is submucosal edema within this tubular structure and enhancing mucosa. This is felt separate from nearby ovary. Normal ovary is in the same region on MRI coronal image 33 of/series 15 . There is small amount of free fluid in the RIGHT pelvis. The more distal colon is normal.  Rectosigmoid colon normal. Vascular/Lymphatic: Abdominal aorta is normal caliber. No periportal or retroperitoneal adenopathy. No pelvic adenopathy. Reproductive: Uterus and ovaries appear normal. Small RIGHT ovary measuring 1.7 x 1.0 cm present image 62/18. Normal LEFT ovary on image 62/18 Other: Small amount free fluid in the RIGHT pelvis Musculoskeletal: . IMPRESSION: 1. In comparing present MRI and same day CT, there is a tubular structure in the RIGHT lower quadrant with mucosal enhancement and submucosal edema. Small amount free fluid the deep RIGHT pelvis. Differential would include ACUTE APPENDICITIS, Meckel's diverticulum, or inflamed loop of small bowel (enteritis). As this is equivocal case, recommend surgical consultation for further evaluation. 2. Mild inflammation within distal  ileum.  No abscess formation. 3. Ovaries are normal. Findings conveyed toDoug Annabell Howells 12/28/2018  at08:32. Electronically Signed   By: Suzy Bouchard M.D.   On: 12/28/2018 08:32   Ct Abdomen Pelvis W Contrast  Result Date: 12/27/2018 CLINICAL DATA:  Abdominal pain, right lower quadrant pain, nausea, vomiting EXAM: CT ABDOMEN AND PELVIS WITH CONTRAST TECHNIQUE: Multidetector CT imaging of the abdomen and pelvis was performed using the standard protocol following bolus administration of intravenous contrast. CONTRAST:  135m OMNIPAQUE IOHEXOL 300 MG/ML  SOLN COMPARISON:  None. FINDINGS: Lower chest: Lung bases are clear. No effusions. Heart is normal size. Hepatobiliary: No focal hepatic abnormality. Gallbladder unremarkable. Pancreas: No focal abnormality or ductal dilatation. Spleen: No focal abnormality.  Normal size. Adrenals/Urinary Tract: No adrenal abnormality. No focal renal abnormality. No stones or hydronephrosis. Urinary bladder is unremarkable. Stomach/Bowel: Stomach is distended with fluid and debris. Mild distention of the colon with fluid. Distal small bowel loops are fluid-filled. No bowel distention to suggest abscess. Appendix not definitively seen. Vascular/Lymphatic: No evidence of aneurysm or adenopathy. Reproductive: Uterus and adnexa unremarkable.  No mass. Other: Small amount of free fluid in the pelvis, likely physiologic. No free air. Musculoskeletal: No acute bony abnormality. IMPRESSION: Nonvisualization of the appendix. Distention of the stomach with fluid and food debris. Moderate fluid  throughout the distal small bowel loops and colon. Findings are nonspecific. This could reflect gastroenteritis. No evidence of bowel obstruction. Electronically Signed   By: Rolm Baptise M.D.   On: 12/27/2018 21:43    Scheduled Meds: Continuous Infusions: . dextrose 5 % and 0.9 % NaCl with KCl 20 mEq/L 75 mL/hr at 12/28/18 1316  . piperacillin-tazobactam (ZOSYN)  IV       Assessment/Plan:  1. Abdominal pain concerning for acute appendicitis, leukocytosis.  Case discussed with general surgery and they would like the medicine team to stay on the case until they notes appendicitis or not.  Patient likely will have a laparoscopic procedure this evening.  COVID-19 testing came back negative.  Will hold off on GI consultation pending results of laparoscopic procedure.  Zosyn started. 2. Hypokalemia.  Replace potassium and IV fluids 3. Advised birth control pill is not as effective once antibiotics are given  Code Status:     Code Status Orders  (From admission, onward)         Start     Ordered   12/28/18 0402  Full code  Continuous     12/28/18 0408        Code Status History    This patient has a current code status but no historical code status.   Advance Care Planning Activity     Family Communication: Family at bedside Disposition Plan: To be determined  Consultants:  General surgery  Antibiotics:  Zosyn  Time spent: 26 minutes  [place]

## 2018-12-28 NOTE — Anesthesia Post-op Follow-up Note (Signed)
Anesthesia QCDR form completed.        

## 2018-12-28 NOTE — Anesthesia Postprocedure Evaluation (Signed)
Anesthesia Post Note  Patient: Paula Cooley  Procedure(s) Performed: XI ROBOT ASSISTED DIAGNOSTIC LAPAROSCOPY (N/A )  Patient location during evaluation: PACU Anesthesia Type: General Level of consciousness: awake and alert Pain management: pain level controlled Vital Signs Assessment: post-procedure vital signs reviewed and stable Respiratory status: spontaneous breathing, nonlabored ventilation, respiratory function stable and patient connected to nasal cannula oxygen Cardiovascular status: blood pressure returned to baseline and stable Postop Assessment: no apparent nausea or vomiting Anesthetic complications: no     Last Vitals:  Vitals:   12/28/18 2210 12/28/18 2215  BP: 120/80   Pulse: 84 82  Resp: 15 14  Temp: (!) 36.1 C   SpO2: 99% 99%    Last Pain:  Vitals:   12/28/18 2200  TempSrc:   PainSc: 2                  Martha Clan

## 2018-12-28 NOTE — ED Notes (Signed)
MRI [place], MD REPORTS NO VISUAL ON APPENDIX, POSSIBLE DISTAL 3RD ILLIAM WALL THICKENING, FLUID FILED SMALL BOWEL.  [facility] CALLED DUE TO RESULTS

## 2018-12-28 NOTE — Transfer of Care (Signed)
Immediate Anesthesia Transfer of Care Note  Patient: Paula Cooley  Procedure(s) Performed: XI ROBOT ASSISTED DIAGNOSTIC LAPAROSCOPY (N/A )  Patient Location: PACU  Anesthesia Type:General  Level of Consciousness: sedated and patient cooperative  Airway & Oxygen Therapy: Patient Spontanous Breathing and Patient connected to face mask oxygen  Post-op Assessment: Report given to RN and Post -op Vital signs reviewed and stable  Post vital signs: Reviewed and stable  Last Vitals:  Vitals Value Taken Time  BP 129/87 12/28/18 2107  Temp    Pulse 86 12/28/18 2109  Resp 13 12/28/18 2109  SpO2 100 % 12/28/18 2109  Vitals shown include unvalidated device data.  Last Pain:  Vitals:   12/28/18 1605  TempSrc: Tympanic  PainSc:       Patients Stated Pain Goal: 0 (01/49/96 9249)  Complications: No apparent anesthesia complications

## 2018-12-28 NOTE — Consult Note (Signed)
Subjective:   CC: RLQ pain  HPI:  Paula Cooley is a 28 y.o. female who is consulted by [facility] for evaluation of  above cc.  Symptoms were first noted 1 day ago. Pain is sharp, sudden onset, woke from sleep, gradual worsening.  Associated with nausea/vomiting, exacerbated by nothing specific.  Anorexic.     Past Medical History:  has a past medical history of Acetabular labrum tear, Acne, Asthma, Hypoglycemia, and Seasonal allergies.  Past Surgical History:  has a past surgical history that includes Wisdom tooth extraction; Rotator cuff repair (Right, 07/2011); and Shoulder arthroscopy with labral repair (Right, 01/01/2016).  Family History: family history includes Cancer in her paternal grandmother; Diabetes in her father; Heart disease (age of onset: 72) in her father; Hypertension in her mother.  Social History:  reports that she quit smoking about 4 years ago. She has never used smokeless tobacco. She reports current alcohol use. She reports that she does not use drugs. Vapes  Current Medications: adderall  Allergies:  Allergies as of 12/27/2018 - Review Complete 12/27/2018  Allergen Reaction Noted  . No known allergies  12/31/2015    ROS:  General: Denies weight loss, weight gain, fatigue, fevers, chills, and night sweats. Eyes: Denies blurry vision, double vision, eye pain, itchy eyes, and tearing. Ears: Denies hearing loss, earache, and ringing in ears. Nose: Denies sinus pain, congestion, infections, runny nose, and nosebleeds. Mouth/throat: Denies hoarseness, sore throat, bleeding gums, and difficulty swallowing. Heart: Denies chest pain, palpitations, racing heart, irregular heartbeat, leg pain or swelling, and decreased activity tolerance. Respiratory: Denies breathing difficulty, shortness of breath, wheezing, cough, and sputum. GI: Denies change in appetite, heartburn, constipation, diarrhea, and blood in stool. GU: Denies difficulty urinating, pain with urinating,  urgency, frequency, blood in urine. Musculoskeletal: Denies joint stiffness, pain, swelling, muscle weakness. Skin: Denies rash, itching, mass, tumors, sores, and boils Neurologic: Denies headache, fainting, dizziness, seizures, numbness, and tingling. Psychiatric: Denies depression, anxiety, difficulty sleeping, and memory loss. Endocrine: Denies heat or cold intolerance, and increased thirst or urination. Blood/lymph: Denies easy bruising, easy bruising, and swollen glands     Objective:     BP 119/69 (BP Location: Left Arm)   Pulse (!) 105   Temp (!) 100.9 F (38.3 C)   Resp 16   Ht 5' 6"  (1.676 m)   Wt 77.1 kg   LMP  (LMP Unknown) Comment: neg preg test  SpO2 96%   BMI 27.44 kg/m    Constitutional :  alert, cooperative, appears stated age and no distress  Lymphatics/Throat:  no asymmetry, masses, or scars  Respiratory:  clear to auscultation bilaterally  Cardiovascular:  regular rate and rhythm  Gastrointestinal: soft, focal guarding in suprapubic region with TTP.  remaining qudrants soft, with no guarding.   Musculoskeletal: Steady gait and movement  Skin: Cool and moist  Psychiatric: Normal affect, non-agitated, not confused       LABS:  CMP Latest Ref Rng & Units 12/28/2018 12/27/2018 12/26/2015  Glucose 70 - 99 mg/dL 107(H) 123(H) 98  BUN 6 - 20 mg/dL 7 11 7   Creatinine 0.44 - 1.00 mg/dL 0.76 0.80 0.84  Sodium 135 - 145 mmol/L 137 139 139  Potassium 3.5 - 5.1 mmol/L 3.5 3.8 3.6  Chloride 98 - 111 mmol/L 108 105 107  CO2 22 - 32 mmol/L 21(L) 23 20(L)  Calcium 8.9 - 10.3 mg/dL 8.3(L) 9.1 9.5  Total Protein 6.5 - 8.1 g/dL 6.5 7.3 -  Total Bilirubin 0.3 - 1.2 mg/dL  0.8 0.6 -  Alkaline Phos 38 - 126 U/L 57 68 -  AST 15 - 41 U/L 19 18 -  ALT 0 - 44 U/L 14 14 -   CBC Latest Ref Rng & Units 12/28/2018 12/27/2018 12/26/2015  WBC 4.0 - 10.5 K/uL 18.5(H) 14.5(H) 6.6  Hemoglobin 12.0 - 15.0 g/dL 12.6 13.5 14.0  Hematocrit 36.0 - 46.0 % 35.8(L) 39.3 40.9  Platelets  150 - 400 K/uL 238 264 244     RADS: CLINICAL DATA:  Abdominal pain, appendicitis suspected. RIGHT lower quadrant pain. RIGHT lower quadrant tended to palpation. Elevated white blood cell count. Is  EXAM: MRI ABDOMEN AND PELVIS WITHOUT CONTRAST  TECHNIQUE: Multiplanar multisequence MR imaging of the abdomen and pelvis was performed. No intravenous contrast was administered.  COMPARISON:  CT 12/27/2018 (same day)  FINDINGS: COMBINED FINDINGS FOR BOTH MR ABDOMEN AND PELVIS  Lower chest: Lung bases are clear.  Hepatobiliary: No focal hepatic lesion. No biliary duct dilatation. Gallbladder is normal. Common bile duct is normal.  Pancreas: Pancreas is normal. No ductal dilatation. No pancreatic inflammation.  Spleen: Normal spleen  Adrenals/urinary tract: Adrenal glands and kidneys are normal. The ureters and bladder normal.  Stomach/Bowel: Stomach is distended by gastric contents. No obstructing lesion identified. Proximal small bowel duodenum are normal. The terminal ileum is normal.  The more distal ileum proximal terminal ileum is poorly defined within the RIGHT lower quadrant with suggestive of edema/inflammation (image 57/18).  There is a tubular structure in the RIGHT lower quadrant measuring 11 mm diameter (image series 18). Small amount fluid within centrally within this structure. Difficult to ascertain if this is loop of small bowel or the appendix.  In comparison to CT ,there is a a tubular structure in the same vicinity best seen on coronal image 33/5. There is submucosal edema within this tubular structure and enhancing mucosa. This is felt separate from nearby ovary. Normal ovary is in the same region on MRI coronal image 33 of/series 15 .  There is small amount of free fluid in the RIGHT pelvis.  The more distal colon is normal.  Rectosigmoid colon normal.  Vascular/Lymphatic: Abdominal aorta is normal caliber. No periportal or  retroperitoneal adenopathy. No pelvic adenopathy.  Reproductive: Uterus and ovaries appear normal. Small RIGHT ovary measuring 1.7 x 1.0 cm present image 62/18. Normal LEFT ovary on image 62/18  Other: Small amount free fluid in the RIGHT pelvis  Musculoskeletal: .  IMPRESSION: 1. In comparing present MRI and same day CT, there is a tubular structure in the RIGHT lower quadrant with mucosal enhancement and submucosal edema. Small amount free fluid the deep RIGHT pelvis. Differential would include ACUTE APPENDICITIS, Meckel's diverticulum, or inflamed loop of small bowel (enteritis). As this is equivocal case, recommend surgical consultation for further evaluation. 2. Mild inflammation within distal ileum.  No abscess formation. 3. Ovaries are normal.  Findings conveyed toDoug Annabell Howells 12/28/2018  at08:32.   Electronically Signed   By: Suzy Bouchard M.D.   On: 12/28/2018 08:32  Assessment:      Right lower quadrant pain.  Clinical history most consistent with appendicitis but MRI and CT prior performed not as obvious.  Differential including those listed above in the MRI report.  Plan:     Due to persistent symptoms, we will proceed with diagnostic laparoscopy and then proceed from there. Discussed the risk of surgery including post-op infxn, seroma, hematoma, abscess formation, chronic pain, poor-delayed wound healing, possible bowel resection, possible ostomy, possible conversion to  open procedure, post-op SBO or ileus, and need for additional procedures to address said risks.  The risks of general anesthetic including MI, CVA, sudden death or even reaction to anesthetic medications also discussed. Alternatives include continued observation, or antibiotic treatment.  Benefits include possible symptom relief,   Typical post operative recovery based on findings also discussed.  The patient understands the risks, any and all questions were answered to the  patient's satisfaction.  Continue antibiotics, IV fluids, n.p.o. status until surgery pending covid status.  Plan is to try robotic assisted approach first.

## 2018-12-28 NOTE — Anesthesia Procedure Notes (Signed)
Procedure Name: Intubation Date/Time: 12/28/2018 5:08 PM Performed by: Aline Brochure, CRNA Pre-anesthesia Checklist: Patient identified, Emergency Drugs available, Suction available and Patient being monitored Patient Re-evaluated:Patient Re-evaluated prior to induction Oxygen Delivery Method: Circle system utilized Preoxygenation: Pre-oxygenation with 100% oxygen Induction Type: IV induction Ventilation: Mask ventilation without difficulty Laryngoscope Size: McGraph and 3 Grade View: Grade I Tube type: Oral Tube size: 7.0 mm Number of attempts: 1 Airway Equipment and Method: Stylet and Video-laryngoscopy Placement Confirmation: ETT inserted through vocal cords under direct vision,  positive ETCO2 and breath sounds checked- equal and bilateral Secured at: 21 cm Tube secured with: Tape Dental Injury: Teeth and Oropharynx as per pre-operative assessment

## 2018-12-28 NOTE — Op Note (Signed)
Preoperative diagnosis: Right lower quadrant pain Postoperative diagnosis: Perforated Meckel's diverticulitis  Procedure: Robotic assisted laparoscopic Meckel's diverticulum resection converted to open.  Anesthesia: GETA  Surgeon: Benjamine Sprague  Wound Classification: clean contaminated  Specimen: Meckel's diverticulitis  Complications: None  Estimated Blood Loss: 50 mL   Indications: Patient is a 28 y.o. female  presented with above.  Please see H&P for further details.    FIndings: 1.  Meckel's diverticulitis with perforation 2.  Purulent fluid throughout abdomen 3.  Irritated but normal appendix 4.  Adequate perfusion of anastomosis based on ICG 5. Adequate hemostasis.   Description of procedure: The patient was placed on the operating table in the supine position, left arm tucked. General anesthesia was induced. A time-out was completed verifying correct patient, procedure, site, positioning, and implant(s) and/or special equipment prior to beginning this procedure. The abdomen was prepped and draped in the usual sterile fashion.   Palmer's point located and Veress needle was inserted.  After confirming 2 clicks and a positive saline drop test, gas insufflation was initiated until the abdominal pressure was measured at 15 mmHg.  Afterwards, the Veress needle was removed and a 8 mm port was placed through the same site using Optiview technique after extending the incision with an 11 blade.  After local was infused, 2 additional incision was made 8 cm apart along the left side of the abdominal wall from the initial incision.  An 8 mm port was placed at the left lower quadrant port under direct visualization.  Camera was switched over to this new port and the left upper quadrant incision was extended yet again to 12 mm and a 12 mm port was placed under direct visualization.  Final 8 mm port that was previously in the Palmer's point was then placed in the middle 8 mm port site.  No injuries  from trocar placements were noted. The table was placed in the Trendelenburg position with the right side elevated.  Xi robotic platform was then brought to the operative field and docked at an angle from the left lower quadrant.  Fenestrated forceps were placed in the left arm port and a [place] with electrocautery was placed in the right hand port.  Examination of the abdominal cavity noted purulent fluid throughout, with intense inflammation and adhesions from the omentum noted at the terminal ileum.  Further exploration noted a perforated Meckel's diverticulitis in this area.  Bowel was run towards the ileocecal valve, very irritated but otherwise normal-looking appendix was noted.  No additional abdominal pathology was noted.  Decision was made at this point to proceed with a segmental bowel resection incorporating the Meckel's diverticulitis.    A window was made in the mesentery using [place] dissector proximal and distal to the Meckel's diverticulitis at an area where the bowel had minimal inflammation.  Robotic 60 mm stapler with a blue load was then fired to transect the bowel isolating the Meckel's diverticulitis.  The mesentery was then ligated using the vessel sealer system.  The specimen was placed at the side, and the 2 ends were approximated and held together using 3-0 silk at the staple line and then further down the bowel in preparation for the new anastomosis.  ICG was used to confirm adequate blood supply to the 2 ends.  An enterotomy was made in both ends and attempt to place the stapler was made, however due to the excess spillage attempting this using the robotic system, decision was made to convert to open for better  control of the spillage.  Robotic system was undocked from the operating table, cannulas removed from port sites, and a midline incision was made in the infraumbilical area of into the peritoneal cavity.  The previously noted spillage was extensively irrigated and  suctioned out.  Attention was then turned to the 2 ends, where the enterotomies were extended to pass a GIA stapler with a blue load.  A side-to-side anastomosis was then created by firing the GIA stapler, and then closing the newly created enterotomy site with a TA stapler blue load.  A patent anastomosis was confirmed prior to closing the enterotomy site.  The mesenteric defect was then approximated using 2-0 Vicryl, and the staple lines were Lembert sutured using 3-0 silk in a running fashion.  The resected Meckel's diverticulitis was then removed from the abdominal cavity and passed off operative field pending pathology.  Abdominal cavity was then again irrigated with additional saline and all visible fluid suctioned out.  Anastomosis noted to have adequate hemostasis along with no other additional areas of bleeding.  7 mm JP drain was placed through the most inferior port site and secured to the skin using 3-0 nylon.    Clean closure protocol was then initiated.  1 PDS x2 in a running fashion was used to close the midline fascia.  Skin was then approximated using staples.  The 12 mm port site fascia was closed with 0 Vicryl x1, then the skin incision was closed using 4-0 Monocryl in running subcuticular fashion.  The remaining 8 mm port site was also closed using 4-0 Monocryl in an interrupted fashion in the subcutaneous layer.  Dermabond was used to dress the port sites.  Honeycomb dressing used to dress the midline incision.  Drain site dressed with drain sponge and secured with paper tape.  The patient tolerated the procedure well, awakened from anesthesia and was taken to the postanesthesia care unit in satisfactory condition.  NG tube was placed prior to waking up, and Foley catheter remained in place for strict I&sO postop.  Sponge count and instrument count correct at the end of the procedure.

## 2018-12-28 NOTE — Anesthesia Preprocedure Evaluation (Addendum)
Anesthesia Evaluation  Patient identified by MRN, date of birth, ID band Patient awake    Reviewed: Allergy & Precautions, H&P , NPO status , Patient's Chart, lab work & pertinent test results  History of Anesthesia Complications Negative for: history of anesthetic complications  Airway Mallampati: I  TM Distance: >3 FB Neck ROM: full    Dental  (+) Dental Advidsory Given, Teeth Intact   Pulmonary neg shortness of breath, asthma (exercise induced, no trouble in years) , neg recent URI, former smoker,           Cardiovascular negative cardio ROS       Neuro/Psych negative neurological ROS  negative psych ROS   GI/Hepatic negative GI ROS, Neg liver ROS,   Endo/Other  negative endocrine ROS  Renal/GU      Musculoskeletal   Abdominal   Peds  Hematology negative hematology ROS (+)   Anesthesia Other Findings Past Medical History: No date: Acetabular labrum tear     Comment:  R shoulder  No date: Acne No date: Asthma     Comment:  exercise induced, no problems in quite a while, last use              of inhaler- >one year No date: Hypoglycemia     Comment:  uses glucose tablets PRN No date: Seasonal allergies  Past Surgical History: 07/2011: ROTATOR CUFF REPAIR; Right     Comment:  as also bicep tendon surgery 01/01/2016: SHOULDER ARTHROSCOPY WITH LABRAL REPAIR; Right     Comment:  Procedure: SHOULDER ARTHROSCOPY WITH LABRAL (SLAP)               REPAIR;  Surgeon: Meredith Pel, MD;  Location: [facility];  Service: Orthopedics;  Laterality: Right; No date: WISDOM TOOTH EXTRACTION  BMI    Body Mass Index: 27.44 kg/m      Reproductive/Obstetrics negative OB ROS                            Anesthesia Physical Anesthesia Plan  ASA: II  Anesthesia Plan: General   Post-op Pain Management:    Induction: Intravenous  PONV Risk Score and Plan: Ondansetron,  Dexamethasone, Midazolam and Treatment may vary due to age or medical condition  Airway Management Planned: Oral ETT  Additional Equipment:   Intra-op Plan:   Post-operative Plan: Extubation in OR  Informed Consent: I have reviewed the patients History and Physical, chart, labs and discussed the procedure including the risks, benefits and alternatives for the proposed anesthesia with the patient or authorized representative who has indicated his/her understanding and acceptance.     Dental Advisory Given  Plan Discussed with: Anesthesiologist, CRNA and Surgeon  Anesthesia Plan Comments:        Anesthesia Quick Evaluation

## 2018-12-29 ENCOUNTER — Encounter: Payer: Self-pay | Admitting: Surgery

## 2018-12-29 DIAGNOSIS — E876 Hypokalemia: Secondary | ICD-10-CM | POA: Diagnosis not present

## 2018-12-29 DIAGNOSIS — Z20828 Contact with and (suspected) exposure to other viral communicable diseases: Secondary | ICD-10-CM | POA: Diagnosis present

## 2018-12-29 DIAGNOSIS — Q430 Meckel's diverticulum (displaced) (hypertrophic): Principal | ICD-10-CM

## 2018-12-29 DIAGNOSIS — J45909 Unspecified asthma, uncomplicated: Secondary | ICD-10-CM | POA: Diagnosis present

## 2018-12-29 DIAGNOSIS — R112 Nausea with vomiting, unspecified: Secondary | ICD-10-CM | POA: Diagnosis not present

## 2018-12-29 DIAGNOSIS — Z87891 Personal history of nicotine dependence: Secondary | ICD-10-CM | POA: Diagnosis not present

## 2018-12-29 DIAGNOSIS — R52 Pain, unspecified: Secondary | ICD-10-CM

## 2018-12-29 DIAGNOSIS — K660 Peritoneal adhesions (postprocedural) (postinfection): Secondary | ICD-10-CM | POA: Diagnosis present

## 2018-12-29 DIAGNOSIS — Z8249 Family history of ischemic heart disease and other diseases of the circulatory system: Secondary | ICD-10-CM | POA: Diagnosis not present

## 2018-12-29 DIAGNOSIS — R1031 Right lower quadrant pain: Secondary | ICD-10-CM | POA: Diagnosis present

## 2018-12-29 DIAGNOSIS — K650 Generalized (acute) peritonitis: Secondary | ICD-10-CM | POA: Diagnosis present

## 2018-12-29 DIAGNOSIS — Z5331 Laparoscopic surgical procedure converted to open procedure: Secondary | ICD-10-CM | POA: Diagnosis not present

## 2018-12-29 DIAGNOSIS — K469 Unspecified abdominal hernia without obstruction or gangrene: Secondary | ICD-10-CM | POA: Diagnosis not present

## 2018-12-29 DIAGNOSIS — R1110 Vomiting, unspecified: Secondary | ICD-10-CM

## 2018-12-29 LAB — CBC
HCT: 36.6 % (ref 36.0–46.0)
Hemoglobin: 12.2 g/dL (ref 12.0–15.0)
MCH: 30 pg (ref 26.0–34.0)
MCHC: 33.3 g/dL (ref 30.0–36.0)
MCV: 90.1 fL (ref 80.0–100.0)
Platelets: 257 10*3/uL (ref 150–400)
RBC: 4.06 MIL/uL (ref 3.87–5.11)
RDW: 12.9 % (ref 11.5–15.5)
WBC: 19.8 10*3/uL — ABNORMAL HIGH (ref 4.0–10.5)
nRBC: 0 % (ref 0.0–0.2)

## 2018-12-29 LAB — BASIC METABOLIC PANEL
Anion gap: 13 (ref 5–15)
BUN: 6 mg/dL (ref 6–20)
CO2: 22 mmol/L (ref 22–32)
Calcium: 8.3 mg/dL — ABNORMAL LOW (ref 8.9–10.3)
Chloride: 101 mmol/L (ref 98–111)
Creatinine, Ser: 0.74 mg/dL (ref 0.44–1.00)
GFR calc Af Amer: >60 mL/min (ref >60)
GFR calc non Af Amer: >60 mL/min (ref >60)
Glucose, Bld: 169 mg/dL — ABNORMAL HIGH (ref 70–99)
Potassium: 3.8 mmol/L (ref 3.5–5.1)
Sodium: 136 mmol/L (ref 135–145)

## 2018-12-29 LAB — GLUCOSE, CAPILLARY
Glucose-Capillary: 139 mg/dL — ABNORMAL HIGH (ref 70–99)
Glucose-Capillary: 113 mg/dL — ABNORMAL HIGH (ref 70–99)
Glucose-Capillary: 97 mg/dL (ref 70–99)
Glucose-Capillary: 96 mg/dL (ref 70–99)

## 2018-12-29 LAB — HIV ANTIBODY (ROUTINE TESTING W REFLEX): HIV Screen 4th Generation wRfx: NONREACTIVE — AB

## 2018-12-29 MED ORDER — DEXTROSE-NACL 5-0.45 % IV SOLN
INTRAVENOUS | Status: AC
  Administered 2018-12-29: 09:00:00 via INTRAVENOUS

## 2018-12-29 MED ORDER — SODIUM CHLORIDE 0.9 % IV SOLN
INTRAVENOUS | Status: DC | PRN
  Administered 2018-12-29: 17:00:00 250 mL via INTRAVENOUS

## 2018-12-29 NOTE — Progress Notes (Signed)
[facility] [place] NAME: Paula Cooley    MR#:  [id number]  DATE OF BIRTH:  02-Dec-1990  SUBJECTIVE:   Pt admitted with abdominal pain and work up revealed perforated meckels Diverticulum. Pt is POD#1. Has NG+. tolerating ice chips. Some adominal pain. No family in the room REVIEW OF SYSTEMS:   Review of Systems  Constitutional: Negative for chills, fever and weight loss.  HENT: Negative for ear discharge, ear pain and nosebleeds.   Eyes: Negative for blurred vision, pain and discharge.  Respiratory: Negative for sputum production, shortness of breath, wheezing and stridor.   Cardiovascular: Negative for chest pain, palpitations, orthopnea and PND.  Gastrointestinal: Positive for abdominal pain. Negative for diarrhea, nausea and vomiting.  Genitourinary: Negative for frequency and urgency.  Musculoskeletal: Negative for back pain and joint pain.  Neurological: Negative for sensory change, speech change, focal weakness and weakness.  Psychiatric/Behavioral: Negative for depression and hallucinations. The patient is not nervous/anxious.    Tolerating Diet:ice chips Tolerating PT: ambulatory  DRUG ALLERGIES:   Allergies  Allergen Reactions  . No Known Allergies     VITALS:  Blood pressure 120/79, pulse 78, temperature 98.1 F (36.7 C), temperature source Oral, resp. rate 20, height 5' 6"  (1.676 m), weight 73.3 kg, SpO2 100 %.  PHYSICAL EXAMINATION:   Physical Exam  GENERAL:  28 y.o.-year-old patient lying in the bed with no acute distress.  EYES: Pupils equal, round, reactive to light and accommodation. No scleral icterus. Extraocular muscles intact.  HEENT: Head atraumatic, normocephalic. Oropharynx and nasopharynx clear. NG+ NECK:  Supple, no jugular venous distention. No thyroid enlargement, no tenderness.  LUNGS: Normal breath sounds bilaterally, no wheezing, rales, rhonchi. No use of accessory muscles of respiration.   CARDIOVASCULAR: S1, S2 normal. No murmurs, rubs, or gallops.  ABDOMEN: Soft, nontender, nondistended. Bowel sounds present. No organomegaly or mass. Surgical dressing+ EXTREMITIES: No cyanosis, clubbing or edema b/l.    NEUROLOGIC: Cranial nerves II through XII are intact. No focal Motor or sensory deficits b/l.   PSYCHIATRIC:  patient is alert and oriented x 3.  SKIN: No obvious rash, lesion, or ulcer.   LABORATORY PANEL:  CBC Recent Labs  Lab 12/29/18 0613  WBC 19.8*  HGB 12.2  HCT 36.6  PLT 257    Chemistries  Recent Labs  Lab 12/28/18 0456 12/29/18 0613  NA 137 136  K 3.5 3.8  CL 108 101  CO2 21* 22  GLUCOSE 107* 169*  BUN 7 6  CREATININE 0.76 0.74  CALCIUM 8.3* 8.3*  AST 19  --   ALT 14  --   ALKPHOS 57  --   BILITOT 0.8  --    Cardiac Enzymes No results for input(s): TROPONINI in the last 168 hours. RADIOLOGY:  Mr Pelvis Wo Contrast  Result Date: 12/28/2018 CLINICAL DATA:  Abdominal pain, appendicitis suspected. RIGHT lower quadrant pain. RIGHT lower quadrant tended to palpation. Elevated white blood cell count. Is EXAM: MRI ABDOMEN AND PELVIS WITHOUT CONTRAST TECHNIQUE: Multiplanar multisequence MR imaging of the abdomen and pelvis was performed. No intravenous contrast was administered. COMPARISON:  CT 12/27/2018 (same day) FINDINGS: COMBINED FINDINGS FOR BOTH MR ABDOMEN AND PELVIS Lower chest: Lung bases are clear. Hepatobiliary: No focal hepatic lesion. No biliary duct dilatation. Gallbladder is normal. Common bile duct is normal. Pancreas: Pancreas is normal. No ductal dilatation. No pancreatic inflammation. Spleen: Normal spleen Adrenals/urinary tract: Adrenal glands and kidneys are normal. The ureters and bladder normal.  Stomach/Bowel: Stomach is distended by gastric contents. No obstructing lesion identified. Proximal small bowel duodenum are normal. The terminal ileum is normal. The more distal ileum proximal terminal ileum is poorly defined within the RIGHT  lower quadrant with suggestive of edema/inflammation (image 57/18). There is a tubular structure in the RIGHT lower quadrant measuring 11 mm diameter (image series 18). Small amount fluid within centrally within this structure. Difficult to ascertain if this is loop of small bowel or the appendix. In comparison to CT ,there is a a tubular structure in the same vicinity best seen on coronal image 33/5. There is submucosal edema within this tubular structure and enhancing mucosa. This is felt separate from nearby ovary. Normal ovary is in the same region on MRI coronal image 33 of/series 15 . There is small amount of free fluid in the RIGHT pelvis. The more distal colon is normal.  Rectosigmoid colon normal. Vascular/Lymphatic: Abdominal aorta is normal caliber. No periportal or retroperitoneal adenopathy. No pelvic adenopathy. Reproductive: Uterus and ovaries appear normal. Small RIGHT ovary measuring 1.7 x 1.0 cm present image 62/18. Normal LEFT ovary on image 62/18 Other: Small amount free fluid in the RIGHT pelvis Musculoskeletal: . IMPRESSION: 1. In comparing present MRI and same day CT, there is a tubular structure in the RIGHT lower quadrant with mucosal enhancement and submucosal edema. Small amount free fluid the deep RIGHT pelvis. Differential would include ACUTE APPENDICITIS, Meckel's diverticulum, or inflamed loop of small bowel (enteritis). As this is equivocal case, recommend surgical consultation for further evaluation. 2. Mild inflammation within distal ileum.  No abscess formation. 3. Ovaries are normal. Findings conveyed toDoug Annabell Howells 12/28/2018  at08:32. Electronically Signed   By: Suzy Bouchard M.D.   On: 12/28/2018 08:32   Mr Abdomen Wo Contrast  Result Date: 12/28/2018 CLINICAL DATA:  Abdominal pain, appendicitis suspected. RIGHT lower quadrant pain. RIGHT lower quadrant tended to palpation. Elevated white blood cell count. Is EXAM: MRI ABDOMEN AND PELVIS WITHOUT CONTRAST  TECHNIQUE: Multiplanar multisequence MR imaging of the abdomen and pelvis was performed. No intravenous contrast was administered. COMPARISON:  CT 12/27/2018 (same day) FINDINGS: COMBINED FINDINGS FOR BOTH MR ABDOMEN AND PELVIS Lower chest: Lung bases are clear. Hepatobiliary: No focal hepatic lesion. No biliary duct dilatation. Gallbladder is normal. Common bile duct is normal. Pancreas: Pancreas is normal. No ductal dilatation. No pancreatic inflammation. Spleen: Normal spleen Adrenals/urinary tract: Adrenal glands and kidneys are normal. The ureters and bladder normal. Stomach/Bowel: Stomach is distended by gastric contents. No obstructing lesion identified. Proximal small bowel duodenum are normal. The terminal ileum is normal. The more distal ileum proximal terminal ileum is poorly defined within the RIGHT lower quadrant with suggestive of edema/inflammation (image 57/18). There is a tubular structure in the RIGHT lower quadrant measuring 11 mm diameter (image series 18). Small amount fluid within centrally within this structure. Difficult to ascertain if this is loop of small bowel or the appendix. In comparison to CT ,there is a a tubular structure in the same vicinity best seen on coronal image 33/5. There is submucosal edema within this tubular structure and enhancing mucosa. This is felt separate from nearby ovary. Normal ovary is in the same region on MRI coronal image 33 of/series 15 . There is small amount of free fluid in the RIGHT pelvis. The more distal colon is normal.  Rectosigmoid colon normal. Vascular/Lymphatic: Abdominal aorta is normal caliber. No periportal or retroperitoneal adenopathy. No pelvic adenopathy. Reproductive: Uterus and ovaries appear normal. Small RIGHT ovary measuring  1.7 x 1.0 cm present image 62/18. Normal LEFT ovary on image 62/18 Other: Small amount free fluid in the RIGHT pelvis Musculoskeletal: . IMPRESSION: 1. In comparing present MRI and same day CT, there is a tubular  structure in the RIGHT lower quadrant with mucosal enhancement and submucosal edema. Small amount free fluid the deep RIGHT pelvis. Differential would include ACUTE APPENDICITIS, Meckel's diverticulum, or inflamed loop of small bowel (enteritis). As this is equivocal case, recommend surgical consultation for further evaluation. 2. Mild inflammation within distal ileum.  No abscess formation. 3. Ovaries are normal. Findings conveyed toDoug Annabell Howells 12/28/2018  at08:32. Electronically Signed   By: Suzy Bouchard M.D.   On: 12/28/2018 08:32   Ct Abdomen Pelvis W Contrast  Result Date: 12/27/2018 CLINICAL DATA:  Abdominal pain, right lower quadrant pain, nausea, vomiting EXAM: CT ABDOMEN AND PELVIS WITH CONTRAST TECHNIQUE: Multidetector CT imaging of the abdomen and pelvis was performed using the standard protocol following bolus administration of intravenous contrast. CONTRAST:  156m OMNIPAQUE IOHEXOL 300 MG/ML  SOLN COMPARISON:  None. FINDINGS: Lower chest: Lung bases are clear. No effusions. Heart is normal size. Hepatobiliary: No focal hepatic abnormality. Gallbladder unremarkable. Pancreas: No focal abnormality or ductal dilatation. Spleen: No focal abnormality.  Normal size. Adrenals/Urinary Tract: No adrenal abnormality. No focal renal abnormality. No stones or hydronephrosis. Urinary bladder is unremarkable. Stomach/Bowel: Stomach is distended with fluid and debris. Mild distention of the colon with fluid. Distal small bowel loops are fluid-filled. No bowel distention to suggest abscess. Appendix not definitively seen. Vascular/Lymphatic: No evidence of aneurysm or adenopathy. Reproductive: Uterus and adnexa unremarkable.  No mass. Other: Small amount of free fluid in the pelvis, likely physiologic. No free air. Musculoskeletal: No acute bony abnormality. IMPRESSION: Nonvisualization of the appendix. Distention of the stomach with fluid and food debris. Moderate fluid throughout the distal small  bowel loops and colon. Findings are nonspecific. This could reflect gastroenteritis. No evidence of bowel obstruction. Electronically Signed   By: KRolm BaptiseM.D.   On: 12/27/2018 21:43   ASSESSMENT AND PLAN:  KCHRISTEAN SILVESTRIis a 28y.o. female with history of asthma presents to the ER with complaints of abdominal pain nausea vomiting and diarrhea.  Patient symptoms started 24 hours ago.  Pain is mostly in the right lower quadrant.  Has had multiple episodes of nausea vomiting and diarrhea.  1.Abdominal pain with nausea and diarrhea due to Perforated Meckel's diverticulum -s/p POD#1 Robotic assisted/open  laparoscopic Meckel's diverticulum. -IV LR and D5 1/2 NS for now -ice chips -prn and po pain meds   2. Leukocytosis due to #1 -IV zosyn -no fever - COVID-19 testing came back negative.  3.Hypokalemia.  Replaced potassium and IV fluids -d/c IV KCL  4. H/o Hypoglycemia intermittently -sugars stable-cont d5 since pt has not started po intake -sugar check AC and HS--pt informed  5. Asthma Stable Prn inhalers  Family communication: No family in the room Consults: Surgery Discharge Disposition: to home when stable from surgery standpoint.  D/w Dr SBerneice Heinrichtransfer pt to surgical service.   CODE STATUS: full  DVT Prophylaxis: Lovenox  TOTAL TIME TAKING CARE OF THIS PATIENT: *25* minutes.  >50% time spent on counselling and coordination of care   Note: This dictation was prepared with Dragon dictation along with smaller phrase technology. Any transcriptional errors that result from this process are unintentional.  SFritzi MandesM.D on 12/29/2018 at 7:40 AM  Between 7am to 6pm - Pager - [phone]  After 6pm go  to [email or web address]  [facility]   CC: Primary care physician; Denita Lung, MDPatient ID: Paula Cooley, female   DOB: 26-Jan-1991, 28 y.o.   MRN: [id number]

## 2018-12-29 NOTE — Progress Notes (Signed)
Surgery service notified: when would you like foley catheter to be D/C?

## 2018-12-29 NOTE — Progress Notes (Addendum)
Subjective:  CC: Paula Cooley is a 28 y.o. female  Hospital stay day 0, 1 Day Post-Op robotic assisted laparoscopic converted to open Meckel's diverticulitis resection  HPI: No acute issues.  Pain well controlled.  No flatus or BM.  ROS:  General: Denies weight loss, weight gain, fatigue, fevers, chills, and night sweats. Heart: Denies chest pain, palpitations, racing heart, irregular heartbeat, leg pain or swelling, and decreased activity tolerance. Respiratory: Denies breathing difficulty, shortness of breath, wheezing, cough, and sputum. GI: Denies change in appetite, heartburn, nausea, vomiting, constipation, diarrhea, and blood in stool. GU: Denies difficulty urinating, pain with urinating, urgency, frequency, blood in urine.   Objective:   Temp:  [96.9 F (36.1 C)-100 F (37.8 C)] 98.1 F (36.7 C) (11/18 0416) Pulse Rate:  [69-100] 78 (11/18 0416) Resp:  [10-21] 20 (11/18 0416) BP: (97-129)/(59-88) 120/79 (11/18 0416) SpO2:  [95 %-100 %] 100 % (11/18 0416) Weight:  [73.3 kg] 73.3 kg (11/17 2253)     Height: 5' 6"  (167.6 cm) Weight: 73.3 kg BMI (Calculated): 26.09   Intake/Output this shift:   Intake/Output Summary (Last 24 hours) at 12/29/2018 1014 Last data filed at 12/29/2018 0919 Gross per 24 hour  Intake 1099.97 ml  Output 4035 ml  Net -2935.03 ml    Constitutional :  alert, cooperative, appears stated age and no distress  Respiratory:  clear to auscultation bilaterally  Cardiovascular:  regular rate and rhythm  Gastrointestinal: Soft, no guarding, appropriate tenderness in the lower midline incision.  JP with serosanguineous discharge.  Staples clean dry and intact..   Skin: Cool and moist.   Psychiatric: Normal affect, non-agitated, not confused       LABS:  CMP Latest Ref Rng & Units 12/29/2018 12/28/2018 12/27/2018  Glucose 70 - 99 mg/dL 169(H) 107(H) 123(H)  BUN 6 - 20 mg/dL 6 7 11   Creatinine 0.44 - 1.00 mg/dL 0.74 0.76 0.80  Sodium 135 - 145 mmol/L  136 137 139  Potassium 3.5 - 5.1 mmol/L 3.8 3.5 3.8  Chloride 98 - 111 mmol/L 101 108 105  CO2 22 - 32 mmol/L 22 21(L) 23  Calcium 8.9 - 10.3 mg/dL 8.3(L) 8.3(L) 9.1  Total Protein 6.5 - 8.1 g/dL - 6.5 7.3  Total Bilirubin 0.3 - 1.2 mg/dL - 0.8 0.6  Alkaline Phos 38 - 126 U/L - 57 68  AST 15 - 41 U/L - 19 18  ALT 0 - 44 U/L - 14 14   CBC Latest Ref Rng & Units 12/29/2018 12/28/2018 12/27/2018  WBC 4.0 - 10.5 K/uL 19.8(H) 18.5(H) 14.5(H)  Hemoglobin 12.0 - 15.0 g/dL 12.2 12.6 13.5  Hematocrit 36.0 - 46.0 % 36.6 35.8(L) 39.3  Platelets 150 - 400 K/uL 257 238 264    RADS: n/a Assessment:   S/p robotic assisted laparoscopic converted to open Meckel's diverticulitis resection.  Doing well.  Continue NG tube decompression until return of bowel function.  Encourage ambulation out of bed to chair and ice chips.  Foley catheter discontinued.  Void check later.  Continue antibiotic therapy due to diffuse peritonitis secondary to perforation.

## 2018-12-30 LAB — CBC
HCT: 34.7 % — ABNORMAL LOW (ref 36.0–46.0)
Hemoglobin: 11.8 g/dL — ABNORMAL LOW (ref 12.0–15.0)
MCH: 30.1 pg (ref 26.0–34.0)
MCHC: 34 g/dL (ref 30.0–36.0)
MCV: 88.5 fL (ref 80.0–100.0)
Platelets: 249 10*3/uL (ref 150–400)
RBC: 3.92 MIL/uL (ref 3.87–5.11)
RDW: 13 % (ref 11.5–15.5)
WBC: 12.9 10*3/uL — ABNORMAL HIGH (ref 4.0–10.5)
nRBC: 0 % (ref 0.0–0.2)

## 2018-12-30 LAB — BASIC METABOLIC PANEL
Anion gap: 12 (ref 5–15)
BUN: 9 mg/dL (ref 6–20)
CO2: 23 mmol/L (ref 22–32)
Calcium: 8.4 mg/dL — ABNORMAL LOW (ref 8.9–10.3)
Chloride: 102 mmol/L (ref 98–111)
Creatinine, Ser: 0.88 mg/dL (ref 0.44–1.00)
GFR calc Af Amer: >60 mL/min (ref >60)
GFR calc non Af Amer: >60 mL/min (ref >60)
Glucose, Bld: 84 mg/dL (ref 70–99)
Potassium: 3.4 mmol/L — ABNORMAL LOW (ref 3.5–5.1)
Sodium: 137 mmol/L (ref 135–145)

## 2018-12-30 LAB — GLUCOSE, CAPILLARY
Glucose-Capillary: 79 mg/dL (ref 70–99)
Glucose-Capillary: 69 mg/dL — ABNORMAL LOW (ref 70–99)
Glucose-Capillary: 73 mg/dL (ref 70–99)
Glucose-Capillary: 123 mg/dL — ABNORMAL HIGH (ref 70–99)
Glucose-Capillary: 88 mg/dL (ref 70–99)

## 2018-12-30 LAB — SURGICAL PATHOLOGY

## 2018-12-30 MED ORDER — IBUPROFEN 400 MG PO TABS: 800.0000 mg | ORAL_TABLET | Freq: Three times a day (TID) | ORAL | Status: DC | PRN

## 2018-12-30 MED ORDER — AMPHETAMINE-DEXTROAMPHETAMINE 5 MG PO TABS
20.0000 mg | ORAL_TABLET | Freq: Two times a day (BID) | ORAL | Status: DC
  Administered 2018-12-30: 15:00:00 20 mg via ORAL
  Filled 2018-12-30 (×2): qty 4

## 2018-12-30 MED ORDER — SIMETHICONE 80 MG PO CHEW
80.0000 mg | CHEWABLE_TABLET | Freq: Four times a day (QID) | ORAL | Status: DC | PRN
  Administered 2018-12-30 – 2018-12-31 (×5): 80 mg via ORAL
  Filled 2018-12-30 (×6): qty 1

## 2018-12-30 MED ORDER — HYDROMORPHONE HCL 1 MG/ML IJ SOLN
0.5000 mg | INTRAMUSCULAR | Status: DC | PRN
  Administered 2018-12-30 – 2019-01-04 (×20): 0.5 mg via INTRAVENOUS
  Filled 2018-12-30 (×22): qty 0.5

## 2018-12-30 MED ORDER — HYDROCODONE-ACETAMINOPHEN 5-325 MG PO TABS
1.0000 | ORAL_TABLET | Freq: Four times a day (QID) | ORAL | Status: DC | PRN
  Administered 2018-12-30: 20:00:00 1 via ORAL
  Filled 2018-12-30 (×2): qty 1

## 2018-12-30 MED ORDER — TRAMADOL HCL 50 MG PO TABS: 50.0000 mg | ORAL_TABLET | Freq: Four times a day (QID) | ORAL | Status: DC | PRN

## 2018-12-30 NOTE — Progress Notes (Signed)
Patient complaining of sharp gas pain in abdominal area that is causing a lot of pressure. I gave patient toradol and called Dr. Celine Ahr to get some gas medication ordered. Will continue to monitor.  Christene Slates

## 2018-12-30 NOTE — Progress Notes (Signed)
Subjective:  CC: Paula Cooley is a 28 y.o. female  Hospital stay day 1, 2 Days Post-Op robotic assisted laparoscopic converted to open Meckel's diverticulitis resection  HPI: No acute issues.  Some gas pain that is relieved with gas-x and when passing flatus.  Did ok with clamp trial, started some clears.  ROS:  General: Denies weight loss, weight gain, fatigue, fevers, chills, and night sweats. Heart: Denies chest pain, palpitations, racing heart, irregular heartbeat, leg pain or swelling, and decreased activity tolerance. Respiratory: Denies breathing difficulty, shortness of breath, wheezing, cough, and sputum. GI: Denies change in appetite, heartburn, nausea, vomiting, constipation, diarrhea, and blood in stool. GU: Denies difficulty urinating, pain with urinating, urgency, frequency, blood in urine.   Objective:   Temp:  [98.3 F (36.8 C)-98.7 F (37.1 C)] 98.7 F (37.1 C) (11/19 1143) Pulse Rate:  [82-90] 88 (11/19 1143) Resp:  [16-20] 16 (11/19 1143) BP: (94-120)/(52-74) 120/74 (11/19 1143) SpO2:  [98 %-100 %] 100 % (11/19 1143)     Height: 5' 6"  (167.6 cm) Weight: 73.3 kg BMI (Calculated): 26.09   Intake/Output this shift:   Intake/Output Summary (Last 24 hours) at 12/30/2018 1344 Last data filed at 12/30/2018 1310 Gross per 24 hour  Intake 872.72 ml  Output 1840 ml  Net -967.28 ml    Constitutional :  alert, cooperative, appears stated age and no distress  Respiratory:  clear to auscultation bilaterally  Cardiovascular:  regular rate and rhythm  Gastrointestinal: Soft, no guarding, appropriate tenderness in the bilateral paracolic gutters.  JP with serous discharge.  Staples clean dry and intact..   Skin: Cool and moist.   Psychiatric: Normal affect, non-agitated, not confused       LABS:  CMP Latest Ref Rng & Units 12/30/2018 12/29/2018 12/28/2018  Glucose 70 - 99 mg/dL 84 169(H) 107(H)  BUN 6 - 20 mg/dL 9 6 7   Creatinine 0.44 - 1.00 mg/dL 0.88 0.74 0.76   Sodium 135 - 145 mmol/L 137 136 137  Potassium 3.5 - 5.1 mmol/L 3.4(L) 3.8 3.5  Chloride 98 - 111 mmol/L 102 101 108  CO2 22 - 32 mmol/L 23 22 21(L)  Calcium 8.9 - 10.3 mg/dL 8.4(L) 8.3(L) 8.3(L)  Total Protein 6.5 - 8.1 g/dL - - 6.5  Total Bilirubin 0.3 - 1.2 mg/dL - - 0.8  Alkaline Phos 38 - 126 U/L - - 57  AST 15 - 41 U/L - - 19  ALT 0 - 44 U/L - - 14   CBC Latest Ref Rng & Units 12/30/2018 12/29/2018 12/28/2018  WBC 4.0 - 10.5 K/uL 12.9(H) 19.8(H) 18.5(H)  Hemoglobin 12.0 - 15.0 g/dL 11.8(L) 12.2 12.6  Hematocrit 36.0 - 46.0 % 34.7(L) 36.6 35.8(L)  Platelets 150 - 400 K/uL 249 257 238    RADS: n/a Assessment:   S/p robotic assisted laparoscopic converted to open Meckel's diverticulitis resection.  Doing well.  Start clears after passing clamp trial with report of continued flatus.  New pain distribution potentially from shifts in retained irrigation from operative.  JP drainage remains serous, no evidence of leak.  Will continue clears for today and monitor closely

## 2018-12-30 NOTE — Consult Note (Signed)
Pharmacy Antibiotic Note  Paula Cooley is a 28 y.o. female admitted on 12/27/2018 with abdominal pain and vomiting. Patient found to have perforated  Meckel's diverticulitis  and purulent fluid throughout abdomen. Pharmacy has been consulted for  Zosyn dosing.  Plan: Day 3 IV abx. Continue Zosyn 3.375 IV EI every 8 hours.   Height: 5' 6"  (167.6 cm) Weight: 161 lb 9.6 oz (73.3 kg) IBW/kg (Calculated) : 59.3  Temp (24hrs), Avg:98.6 F (37 C), Min:98.3 F (36.8 C), Max:98.7 F (37.1 C)  Recent Labs  Lab 12/27/18 1829 12/28/18 0456 12/29/18 0613 12/30/18 0501  WBC 14.5* 18.5* 19.8* 12.9*  CREATININE 0.80 0.76 0.74 0.88    Estimated Creatinine Clearance: 98.4 mL/min (by C-G formula based on SCr of 0.88 mg/dL).    Allergies  Allergen Reactions  . No Known Allergies     Antimicrobials this admission: 11/17 Zosyn  >>   Thank you for allowing pharmacy to be a part of this patient's care.  Pernell Dupre, PharmD, BCPS Clinical Pharmacist 12/30/2018 1:47 PM

## 2018-12-30 NOTE — Progress Notes (Signed)
Nasogastric tube removed per MD order. Orders followed.

## 2018-12-31 ENCOUNTER — Inpatient Hospital Stay: Payer: No Typology Code available for payment source | Admitting: Certified Registered Nurse Anesthetist

## 2018-12-31 ENCOUNTER — Encounter
Admission: EM | Disposition: A | Discharge status: Home / Self Care | Payer: Self-pay | Source: Home / Self Care | Attending: Surgery

## 2018-12-31 ENCOUNTER — Encounter: Payer: Self-pay | Admitting: Radiology

## 2018-12-31 ENCOUNTER — Inpatient Hospital Stay: Payer: No Typology Code available for payment source

## 2018-12-31 HISTORY — PX: LAPAROSCOPY: SHX197

## 2018-12-31 HISTORY — PX: LAPAROTOMY: SHX154

## 2018-12-31 LAB — GLUCOSE, CAPILLARY
Glucose-Capillary: 87 mg/dL (ref 70–99)
Glucose-Capillary: 101 mg/dL — ABNORMAL HIGH (ref 70–99)
Glucose-Capillary: 117 mg/dL — ABNORMAL HIGH (ref 70–99)
Glucose-Capillary: 110 mg/dL — ABNORMAL HIGH (ref 70–99)

## 2018-12-31 LAB — CBC WITH DIFFERENTIAL/PLATELET
Abs Immature Granulocytes: 0.04 10*3/uL (ref 0.00–0.07)
Basophils Absolute: 0 10*3/uL (ref 0.0–0.1)
Basophils Relative: 0 %
Eosinophils Absolute: 0.3 10*3/uL (ref 0.0–0.5)
Eosinophils Relative: 3 %
HCT: 39.7 % (ref 36.0–46.0)
Hemoglobin: 13.7 g/dL (ref 12.0–15.0)
Immature Granulocytes: 0 %
Lymphocytes Relative: 13 %
Lymphs Abs: 1.2 10*3/uL (ref 0.7–4.0)
MCH: 29.8 pg (ref 26.0–34.0)
MCHC: 34.5 g/dL (ref 30.0–36.0)
MCV: 86.5 fL (ref 80.0–100.0)
Monocytes Absolute: 0.6 10*3/uL (ref 0.1–1.0)
Monocytes Relative: 6 %
Neutro Abs: 7.5 10*3/uL (ref 1.7–7.7)
Neutrophils Relative %: 78 %
Platelets: 303 10*3/uL (ref 150–400)
RBC: 4.59 MIL/uL (ref 3.87–5.11)
RDW: 12.7 % (ref 11.5–15.5)
WBC: 9.7 10*3/uL (ref 4.0–10.5)
nRBC: 0 % (ref 0.0–0.2)

## 2018-12-31 LAB — BASIC METABOLIC PANEL
Anion gap: 14 (ref 5–15)
BUN: 10 mg/dL (ref 6–20)
CO2: 23 mmol/L (ref 22–32)
Calcium: 8.6 mg/dL — ABNORMAL LOW (ref 8.9–10.3)
Chloride: 98 mmol/L (ref 98–111)
Creatinine, Ser: 0.77 mg/dL (ref 0.44–1.00)
GFR calc Af Amer: >60 mL/min (ref >60)
GFR calc non Af Amer: >60 mL/min (ref >60)
Glucose, Bld: 110 mg/dL — ABNORMAL HIGH (ref 70–99)
Potassium: 3.3 mmol/L — ABNORMAL LOW (ref 3.5–5.1)
Sodium: 135 mmol/L (ref 135–145)

## 2018-12-31 LAB — PHOSPHORUS: Phosphorus: 3.8 mg/dL (ref 2.5–4.6)

## 2018-12-31 LAB — MAGNESIUM: Magnesium: 1.9 mg/dL (ref 1.7–2.4)

## 2018-12-31 SURGERY — LAPAROSCOPY, DIAGNOSTIC
Anesthesia: General | Site: Abdomen

## 2018-12-31 MED ORDER — FENTANYL CITRATE (PF) 100 MCG/2ML IJ SOLN
INTRAMUSCULAR | Status: DC | PRN
  Administered 2018-12-31 (×5): 50 ug via INTRAVENOUS

## 2018-12-31 MED ORDER — FENTANYL CITRATE (PF) 100 MCG/2ML IJ SOLN
INTRAMUSCULAR | Status: AC
  Administered 2018-12-31: 17:00:00 25 ug via INTRAVENOUS
  Filled 2018-12-31: qty 2

## 2018-12-31 MED ORDER — LIDOCAINE HCL (PF) 2 % IJ SOLN
INTRAMUSCULAR | Status: AC
  Filled 2018-12-31: qty 10

## 2018-12-31 MED ORDER — LIDOCAINE HCL 1 % IJ SOLN
INTRAMUSCULAR | Status: DC | PRN
  Administered 2018-12-31: 17 mL

## 2018-12-31 MED ORDER — OXYCODONE HCL 5 MG/5ML PO SOLN: 5.0000 mg | Freq: Once | ORAL | Status: DC | PRN

## 2018-12-31 MED ORDER — PROMETHAZINE HCL 25 MG/ML IJ SOLN
6.2500 mg | Freq: Four times a day (QID) | INTRAMUSCULAR | Status: DC | PRN
  Administered 2018-12-31: 6.25 mg via INTRAVENOUS
  Filled 2018-12-31: qty 1

## 2018-12-31 MED ORDER — MEPERIDINE HCL 50 MG/ML IJ SOLN: 6.2500 mg | INTRAMUSCULAR | Status: DC | PRN

## 2018-12-31 MED ORDER — OXYCODONE HCL 5 MG PO TABS: 5.0000 mg | ORAL_TABLET | Freq: Once | ORAL | Status: DC | PRN

## 2018-12-31 MED ORDER — LIDOCAINE HCL (CARDIAC) PF 100 MG/5ML IV SOSY
PREFILLED_SYRINGE | INTRAVENOUS | Status: DC | PRN
  Administered 2018-12-31: 100 mg via INTRAVENOUS

## 2018-12-31 MED ORDER — SUCCINYLCHOLINE CHLORIDE 20 MG/ML IJ SOLN
INTRAMUSCULAR | Status: DC | PRN
  Administered 2018-12-31: 80 mg via INTRAVENOUS

## 2018-12-31 MED ORDER — MIDAZOLAM HCL 2 MG/2ML IJ SOLN
INTRAMUSCULAR | Status: AC
  Filled 2018-12-31: qty 2

## 2018-12-31 MED ORDER — DEXAMETHASONE SODIUM PHOSPHATE 10 MG/ML IJ SOLN
INTRAMUSCULAR | Status: DC | PRN
  Administered 2018-12-31: 10 mg via INTRAVENOUS

## 2018-12-31 MED ORDER — FENTANYL CITRATE (PF) 250 MCG/5ML IJ SOLN
INTRAMUSCULAR | Status: AC
  Filled 2018-12-31: qty 5

## 2018-12-31 MED ORDER — 0.9 % SODIUM CHLORIDE (POUR BTL) OPTIME
TOPICAL | Status: DC | PRN
  Administered 2018-12-31: 1000 mL

## 2018-12-31 MED ORDER — PROPOFOL 10 MG/ML IV BOLUS
INTRAVENOUS | Status: AC
  Filled 2018-12-31: qty 20

## 2018-12-31 MED ORDER — DEXAMETHASONE SODIUM PHOSPHATE 10 MG/ML IJ SOLN
INTRAMUSCULAR | Status: AC
  Filled 2018-12-31: qty 1

## 2018-12-31 MED ORDER — BUPIVACAINE-EPINEPHRINE (PF) 0.25% -1:200000 IJ SOLN
INTRAMUSCULAR | Status: DC | PRN
  Administered 2018-12-31: 17 mL

## 2018-12-31 MED ORDER — ONDANSETRON HCL 4 MG/2ML IJ SOLN
INTRAMUSCULAR | Status: AC
  Filled 2018-12-31: qty 2

## 2018-12-31 MED ORDER — MIDAZOLAM HCL 2 MG/2ML IJ SOLN
INTRAMUSCULAR | Status: DC | PRN
  Administered 2018-12-31: 2 mg via INTRAVENOUS

## 2018-12-31 MED ORDER — ROCURONIUM BROMIDE 100 MG/10ML IV SOLN
INTRAVENOUS | Status: DC | PRN
  Administered 2018-12-31: 40 mg via INTRAVENOUS

## 2018-12-31 MED ORDER — IOHEXOL 300 MG/ML  SOLN
100.0000 mL | Freq: Once | INTRAMUSCULAR | Status: AC | PRN
  Administered 2018-12-31: 100 mL via INTRAVENOUS

## 2018-12-31 MED ORDER — FENTANYL CITRATE (PF) 100 MCG/2ML IJ SOLN
25.0000 ug | INTRAMUSCULAR | Status: DC | PRN
  Administered 2018-12-31 (×4): 25 ug via INTRAVENOUS

## 2018-12-31 MED ORDER — LIDOCAINE HCL (PF) 1 % IJ SOLN
INTRAMUSCULAR | Status: AC
  Filled 2018-12-31: qty 30

## 2018-12-31 MED ORDER — PROMETHAZINE HCL 25 MG/ML IJ SOLN: 6.2500 mg | INTRAMUSCULAR | Status: DC | PRN

## 2018-12-31 MED ORDER — PROPOFOL 10 MG/ML IV BOLUS
INTRAVENOUS | Status: DC | PRN
  Administered 2018-12-31: 150 mg via INTRAVENOUS

## 2018-12-31 MED ORDER — IOHEXOL 9 MG/ML PO SOLN
1000.0000 mL | Freq: Once | ORAL | Status: AC | PRN
  Administered 2018-12-31: 1000 mL via ORAL

## 2018-12-31 MED ORDER — LACTATED RINGERS IV SOLN
INTRAVENOUS | Status: DC
  Administered 2018-12-31 – 2019-01-02 (×5): via INTRAVENOUS

## 2018-12-31 MED ORDER — ROCURONIUM BROMIDE 50 MG/5ML IV SOLN
INTRAVENOUS | Status: AC
  Filled 2018-12-31: qty 1

## 2018-12-31 MED ORDER — PROMETHAZINE HCL 25 MG/ML IJ SOLN
INTRAMUSCULAR | Status: AC
  Filled 2018-12-31: qty 1

## 2018-12-31 MED ORDER — SODIUM CHLORIDE FLUSH 0.9 % IV SOLN
INTRAVENOUS | Status: AC
  Filled 2018-12-31: qty 10

## 2018-12-31 MED ORDER — KETOROLAC TROMETHAMINE 30 MG/ML IJ SOLN
INTRAMUSCULAR | Status: DC | PRN
  Administered 2018-12-31: 30 mg via INTRAVENOUS

## 2018-12-31 MED ORDER — BUPIVACAINE-EPINEPHRINE (PF) 0.25% -1:200000 IJ SOLN
INTRAMUSCULAR | Status: AC
  Filled 2018-12-31: qty 30

## 2018-12-31 MED ORDER — DEXTROSE 50 % IV SOLN
12.5000 g | INTRAVENOUS | Status: AC
  Filled 2018-12-31: qty 50

## 2018-12-31 MED ORDER — SUGAMMADEX SODIUM 200 MG/2ML IV SOLN
INTRAVENOUS | Status: DC | PRN
  Administered 2018-12-31: 150 mg via INTRAVENOUS

## 2018-12-31 SURGICAL SUPPLY — 81 items
ADH SKN CLS APL DERMABOND .7 (GAUZE/BANDAGES/DRESSINGS) ×1
APL PRP STRL LF DISP 70% ISPRP (MISCELLANEOUS) ×1
APL SWBSTK 6 STRL LF DISP (MISCELLANEOUS) ×1
APPLICATOR COTTON TIP 6 STRL (MISCELLANEOUS) IMPLANT
APPLICATOR COTTON TIP 6IN STRL (MISCELLANEOUS) ×3
APPLIER CLIP 13 LRG OPEN (CLIP) ×3
APR CLP LRG 13 20 CLIP (CLIP) ×1
BLADE SURG 11 STRL SS SAFETY (MISCELLANEOUS) ×3 IMPLANT
BLADE SURG 15 STRL LF DISP TIS (BLADE) ×1 IMPLANT
BLADE SURG 15 STRL SS (BLADE) ×3
BLADE SURG SZ10 CARB STEEL (BLADE) ×3 IMPLANT
CANISTER SUCT 1200ML W/VALVE (MISCELLANEOUS) ×3 IMPLANT
CANNULA DILATOR 10 W/SLV (CANNULA) ×2 IMPLANT
CANNULA DILATOR 10MM W/SLV (CANNULA) ×1
CHLORAPREP W/TINT 26 (MISCELLANEOUS) ×3 IMPLANT
CLIP APPLIE 13 LRG OPEN (CLIP) ×1 IMPLANT
COVER WAND RF STERILE (DRAPES) ×3 IMPLANT
DERMABOND ADVANCED (GAUZE/BANDAGES/DRESSINGS) ×2
DERMABOND ADVANCED .7 DNX12 (GAUZE/BANDAGES/DRESSINGS) ×1 IMPLANT
DRSG OPSITE POSTOP 3X4 (GAUZE/BANDAGES/DRESSINGS) ×4 IMPLANT
DRSG OPSITE POSTOP 4X10 (GAUZE/BANDAGES/DRESSINGS) ×2 IMPLANT
ELECT BLADE 6 FLAT ULTRCLN (ELECTRODE) IMPLANT
ELECT REM PT RETURN 9FT ADLT (ELECTROSURGICAL) ×6
ELECTRODE REM PT RTRN 9FT ADLT (ELECTROSURGICAL) ×2 IMPLANT
GAUZE SPONGE 4X4 12PLY STRL (GAUZE/BANDAGES/DRESSINGS) ×3 IMPLANT
GLOVE BIOGEL PI IND STRL 7.0 (GLOVE) ×1 IMPLANT
GLOVE BIOGEL PI INDICATOR 7.0 (GLOVE) ×2
GLOVE SURG SYN 6.5 ES PF (GLOVE) ×3 IMPLANT
GLOVE SURG SYN 6.5 PF PI (GLOVE) ×1 IMPLANT
GOWN STRL REUS W/ TWL LRG LVL3 (GOWN DISPOSABLE) ×2 IMPLANT
GOWN STRL REUS W/TWL LRG LVL3 (GOWN DISPOSABLE) ×15 IMPLANT
HANDLE SUCTION POOLE (INSTRUMENTS) ×1 IMPLANT
HANDLE YANKAUER SUCT BULB TIP (MISCELLANEOUS) ×2 IMPLANT
IRRIGATION STRYKERFLOW (MISCELLANEOUS) IMPLANT
IRRIGATOR STRYKERFLOW (MISCELLANEOUS)
KIT TURNOVER KIT A (KITS) ×3 IMPLANT
NDL SAFETY ECLIPSE 18X1.5 (NEEDLE) ×1 IMPLANT
NEEDLE HYPO 18GX1.5 SHARP (NEEDLE) ×3
NEEDLE HYPO 22GX1.5 SAFETY (NEEDLE) ×3 IMPLANT
NEEDLE VERESS 14GA 120MM (NEEDLE) ×3 IMPLANT
NS IRRIG 1000ML POUR BTL (IV SOLUTION) ×3 IMPLANT
PACK BASIN MAJOR ARMC (MISCELLANEOUS) ×3 IMPLANT
PACK LAP CHOLECYSTECTOMY (MISCELLANEOUS) ×3 IMPLANT
PENCIL ELECTRO HAND CTR (MISCELLANEOUS) ×3 IMPLANT
RELOAD LINEAR CUT PROX 55 BLUE (ENDOMECHANICALS) IMPLANT
RELOAD STAPLE 55 3.8 BLU REG (ENDOMECHANICALS) IMPLANT
RELOAD STAPLER LINEAR PROX 30 (STAPLE) IMPLANT
RETRACTOR WOUND ALXS 18CM MED (MISCELLANEOUS) IMPLANT
RTRCTR WOUND ALEXIS O 18CM MED (MISCELLANEOUS) ×3
SCISSORS METZENBAUM CVD 33 (INSTRUMENTS) ×3 IMPLANT
SET TUBE SMOKE EVAC HIGH FLOW (TUBING) ×3 IMPLANT
SLEEVE ENDOPATH XCEL 5M (ENDOMECHANICALS) ×9 IMPLANT
SOL ANTI-FOG 6CC FOG-OUT (MISCELLANEOUS) ×1 IMPLANT
SOL FOG-OUT ANTI-FOG 6CC (MISCELLANEOUS) ×2
SPONGE DRAIN TRACH 4X4 STRL 2S (GAUZE/BANDAGES/DRESSINGS) ×2 IMPLANT
SPONGE LAP 18X18 RF (DISPOSABLE) ×3 IMPLANT
STAPLER PROXIMATE 55 BLUE (STAPLE) IMPLANT
STAPLER RELOAD LINEAR PROX 30 (STAPLE)
STAPLER RELOADABLE 30 BLU REG (STAPLE) IMPLANT
STAPLER SKIN PROX 35W (STAPLE) ×2 IMPLANT
SUCTION POOLE HANDLE (INSTRUMENTS) ×3
SUT CHROMIC 0 SH (SUTURE) IMPLANT
SUT CHROMIC 2 0 SH (SUTURE) IMPLANT
SUT CHROMIC 3 0 SH 27 (SUTURE) IMPLANT
SUT MNCRL 4-0 (SUTURE) ×6
SUT MNCRL 4-0 27XMFL (SUTURE) ×2
SUT PDS AB 1 TP1 54 (SUTURE) ×4 IMPLANT
SUT PROLENE 3 0 PS 2 (SUTURE) IMPLANT
SUT SILK 0 FSL (SUTURE) IMPLANT
SUT SILK 2 0 (SUTURE)
SUT SILK 2 0 REEL (SUTURE) IMPLANT
SUT SILK 2-0 18XBRD TIE 12 (SUTURE) IMPLANT
SUT VIC AB 0 CT1 18XCR BRD 8 (SUTURE) IMPLANT
SUT VIC AB 0 CT1 8-18 (SUTURE)
SUT VIC AB 3-0 SH 27 (SUTURE) ×6
SUT VIC AB 3-0 SH 27X BRD (SUTURE) IMPLANT
SUTURE MNCRL 4-0 27XMF (SUTURE) IMPLANT
SYR 10ML LL (SYRINGE) ×3 IMPLANT
TOWEL OR 17X26 4PK STRL BLUE (TOWEL DISPOSABLE) ×3 IMPLANT
TROCAR XCEL BLUNT TIP 100MML (ENDOMECHANICALS) IMPLANT
TROCAR XCEL NON-BLD 5MMX100MML (ENDOMECHANICALS) ×3 IMPLANT

## 2018-12-31 NOTE — Anesthesia Post-op Follow-up Note (Signed)
Anesthesia QCDR form completed.        

## 2018-12-31 NOTE — Progress Notes (Addendum)
Subjective:  CC: Paula Cooley is a 28 y.o. female  Hospital stay day 2, 3 Days Post-Op robotic assisted laparoscopic converted to open Meckel's diverticulitis resection  HPI: Persistent emesis overnight.  Pain remains about the same   ROS:  General: Denies weight loss, weight gain, fatigue, fevers, chills, and night sweats. Heart: Denies chest pain, palpitations, racing heart, irregular heartbeat, leg pain or swelling, and decreased activity tolerance. Respiratory: Denies breathing difficulty, shortness of breath, wheezing, cough, and sputum. GI: Denies change in appetite, heartburn, nausea, vomiting, constipation, diarrhea, and blood in stool. GU: Denies difficulty urinating, pain with urinating, urgency, frequency, blood in urine.   Objective:   Temp:  [98 F (36.7 C)-98.7 F (37.1 C)] 98 F (36.7 C) (11/20 0515) Pulse Rate:  [79-88] 79 (11/20 0515) Resp:  [16-20] 20 (11/20 0515) BP: (117-120)/(74-90) 117/90 (11/20 0515) SpO2:  [98 %-100 %] 98 % (11/20 0515)     Height: 5' 6"  (167.6 cm) Weight: 73.3 kg BMI (Calculated): 26.09   Intake/Output this shift:   Intake/Output Summary (Last 24 hours) at 12/31/2018 0916 Last data filed at 12/31/2018 0600 Gross per 24 hour  Intake 449.37 ml  Output 1890 ml  Net -1440.63 ml    Constitutional :  alert, cooperative, appears stated age and no distress  Respiratory:  clear to auscultation bilaterally  Cardiovascular:  regular rate and rhythm  Gastrointestinal: Soft, no guarding, increased tenderness in the bilateral paracolic gutters.  JP still with clear serous discharge although large ouput noted.  Staples clean dry and intact..   Skin: Cool and moist.   Psychiatric: Normal affect, non-agitated, not confused       LABS:  CMP Latest Ref Rng & Units 12/30/2018 12/29/2018 12/28/2018  Glucose 70 - 99 mg/dL 84 169(H) 107(H)  BUN 6 - 20 mg/dL 9 6 7   Creatinine 0.44 - 1.00 mg/dL 0.88 0.74 0.76  Sodium 135 - 145 mmol/L 137 136 137   Potassium 3.5 - 5.1 mmol/L 3.4(L) 3.8 3.5  Chloride 98 - 111 mmol/L 102 101 108  CO2 22 - 32 mmol/L 23 22 21(L)  Calcium 8.9 - 10.3 mg/dL 8.4(L) 8.3(L) 8.3(L)  Total Protein 6.5 - 8.1 g/dL - - 6.5  Total Bilirubin 0.3 - 1.2 mg/dL - - 0.8  Alkaline Phos 38 - 126 U/L - - 57  AST 15 - 41 U/L - - 19  ALT 0 - 44 U/L - - 14   CBC Latest Ref Rng & Units 12/30/2018 12/29/2018 12/28/2018  WBC 4.0 - 10.5 K/uL 12.9(H) 19.8(H) 18.5(H)  Hemoglobin 12.0 - 15.0 g/dL 11.8(L) 12.2 12.6  Hematocrit 36.0 - 46.0 % 34.7(L) 36.6 35.8(L)  Platelets 150 - 400 K/uL 249 257 238    RADS: n/a Assessment:   S/p robotic assisted laparoscopic converted to open Meckel's diverticulitis resection.  JP remains serous but large output recorded, with increased tenderness on exam.  Will get STAT CT with oral contrast to look for leak, since the drainage from JP does not obviously look like enteric contents.  Vitals remain stable and exam is not worse but not to the point of obvious peritonitis so no need for emergent re-exploration yet.  Will keep monitoring closely.  ADDENDUM:  CT results as noted below.  Despite the negative read after discussion with the radiologist, oral contrast was not adequate to say that it will have picked up on a leak at the anastomotic site.  Based on recurrent clinical presentation of persistent increasing serous fluid leakage, along with  unchanged abdominal pain, we will proceed with diagnostic laparoscopy first, and then repair if necessary if any abnormalities found.  This was discussed with patient as well as the patient's father and mother.  All were in agreement with the procedure.  Discussed the risk of surgery including post-op infxn, seroma, hematoma, abscess formation, chronic pain, poor-delayed wound healing, possible bowel resection, possible ostomy, possible conversion to open procedure, post-op SBO or ileus, and need for additional procedures to address said risks.  The risks of  general anesthetic including MI, CVA, sudden death or even reaction to anesthetic medications also discussed. Alternatives include continued observation, or antibiotic treatment.  Benefits include possible symptom relief,    CLINICAL DATA:  Three days post robotic assisted laparoscopic open Meckel's diverticular resection, increased JP drain output, tenderness on exam, question leak  EXAM: CT ABDOMEN AND PELVIS WITH CONTRAST  TECHNIQUE: Multidetector CT imaging of the abdomen and pelvis was performed using the standard protocol following bolus administration of intravenous contrast. Sagittal and coronal MPR images reconstructed from axial data set.  CONTRAST:  150m OMNIPAQUE IOHEXOL 300 MG/ML SOLN IV. Dilute oral contrast versus residual from prior exam.  COMPARISON:  12/27/2018  FINDINGS: Lower chest: Minimal dependent atelectasis at lung bases  Hepatobiliary: Gallbladder and liver normal appearance  Pancreas: Normal appearance  Spleen: Normal appearance  Adrenals/Urinary Tract: Small LEFT renal cysts again seen. Adrenal glands, kidneys, ureters, and bladder otherwise normal appearance  Stomach/Bowel: Colon decompressed. Appendix not definitely visualized. Distended stomach. Numerous distended small bowel loops, some which demonstrate mild wall thickening, consistent with postoperative ileus. Bowel anastomosis identified in the anterior mid pelvis. Scattered fluid-filled loops without definite evidence of abscess collection or perforation.  Vascular/Lymphatic: Vascular structures patent. Scattered normal size mesenteric and retroperitoneal lymph nodes.  Reproductive: Unremarkable uterus. Prominent LEFT parametrial vessels more so than RIGHT.  Other: Small amount of free fluid in pelvis. Scattered free air in abdomen. No definite abscess collection identified. Jackson-Pratt drain in anterior pelvis.  Musculoskeletal: No definite acute osseous  findings.  IMPRESSION: Distended stomach and small bowel loops, with some of the small bowel loops demonstrating wall thickening, favor postoperative ileus.  Small-bowel anastomosis in the anterior mid to lower pelvis without definite evidence of abscess or leak.  Scattered small volume free intraperitoneal air and fluid consistent with recent surgery.   Electronically Signed   By: MLavonia DanaM.D.   On: 12/31/2018 12:32

## 2018-12-31 NOTE — Progress Notes (Signed)
Pain and nausea medications with minimal effectiveness overnight; refused po medication this am. "I'd rather have the IV..." Complaints of "gas"; strongly encouraged to ambulate in hall; JP with large output overnight. Dog on FOB throughout the night. Barbaraann Faster, RN 6:00 AM 12/31/2018

## 2018-12-31 NOTE — Transfer of Care (Signed)
Immediate Anesthesia Transfer of Care Note  Patient: Paula Cooley  Procedure(s) Performed: LAPAROSCOPY DIAGNOSTIC (N/A Abdomen) EXPLORATORY LAPAROTOMY WITH REDUCTION OF HERNIA (N/A Abdomen)  Patient Location: PACU  Anesthesia Type:General  Level of Consciousness: awake, alert  and oriented  Airway & Oxygen Therapy: Patient Spontanous Breathing and Patient connected to nasal cannula oxygen  Post-op Assessment: Report given to RN and Post -op Vital signs reviewed and stable  Post vital signs: Reviewed and stable  Last Vitals:  Vitals Value Taken Time  BP    Temp    Pulse    Resp    SpO2      Last Pain:  Vitals:   12/31/18 1442  TempSrc: Temporal  PainSc: 8       Patients Stated Pain Goal: 0 (76/16/07 3710)  Complications: No apparent anesthesia complications

## 2018-12-31 NOTE — Consult Note (Signed)
Pharmacy Antibiotic Note  Paula Cooley is a 28 y.o. female admitted on 12/27/2018 with abdominal pain and vomiting. Patient found to have perforated  Meckel's diverticulitis  and purulent fluid throughout abdomen. Pharmacy was consulted for  Zosyn dosing. This is day # 4 of Zosyn. She is noted to have had persistent emesis overnight with large output from JP. Leukocytosis has resolved  Plan: Continue Zosyn 3.375 IV EI every 8 hours.   Height: 5' 6"  (167.6 cm) Weight: 161 lb 9.6 oz (73.3 kg) IBW/kg (Calculated) : 59.3  Temp (24hrs), Avg:98.3 F (36.8 C), Min:98 F (36.7 C), Max:98.7 F (37.1 C)  Recent Labs  Lab 12/27/18 1829 12/28/18 0456 12/29/18 0613 12/30/18 0501 12/31/18 0932  WBC 14.5* 18.5* 19.8* 12.9* 9.7  CREATININE 0.80 0.76 0.74 0.88 0.77    Estimated Creatinine Clearance: 108.2 mL/min (by C-G formula based on SCr of 0.77 mg/dL).    Antimicrobials this admission: 11/17 Zosyn  >>   Thank you for allowing pharmacy to be a part of this patient's care.  Dallie Piles, PharmD Clinical Pharmacist 12/31/2018 10:44 AM

## 2018-12-31 NOTE — Progress Notes (Signed)
MD gave okay to have mother and father alternate visits to care for daughters service dog while she's inpatient.

## 2018-12-31 NOTE — Anesthesia Procedure Notes (Signed)
Procedure Name: Intubation Date/Time: 12/31/2018 3:08 PM Performed by: Aline Brochure, CRNA Pre-anesthesia Checklist: Patient identified, Emergency Drugs available, Suction available and Patient being monitored Patient Re-evaluated:Patient Re-evaluated prior to induction Oxygen Delivery Method: Circle system utilized Preoxygenation: Pre-oxygenation with 100% oxygen Induction Type: IV induction and Rapid sequence Laryngoscope Size: Mac and 3 Grade View: Grade II Tube type: Oral Tube size: 7.0 mm Number of attempts: 1 Airway Equipment and Method: Stylet and LTA kit utilized Placement Confirmation: ETT inserted through vocal cords under direct vision,  positive ETCO2 and breath sounds checked- equal and bilateral Secured at: 21 cm Tube secured with: Tape Dental Injury: Teeth and Oropharynx as per pre-operative assessment

## 2018-12-31 NOTE — Anesthesia Preprocedure Evaluation (Signed)
Anesthesia Evaluation  Patient identified by MRN, date of birth, ID band Patient awake    Reviewed: Allergy & Precautions, NPO status , Patient's Chart, lab work & pertinent test results  History of Anesthesia Complications Negative for: history of anesthetic complications  Airway Mallampati: II  TM Distance: >3 FB Neck ROM: Full    Dental no notable dental hx.    Pulmonary former smoker,    breath sounds clear to auscultation- rhonchi (-) wheezing      Cardiovascular Exercise Tolerance: Good (-) hypertension(-) CAD, (-) Past MI, (-) Cardiac Stents and (-) CABG  Rhythm:Regular Rate:Normal - Systolic murmurs and - Diastolic murmurs    Neuro/Psych neg Seizures negative neurological ROS  negative psych ROS   GI/Hepatic Neg liver ROS, S/p Meckels resection   Endo/Other  negative endocrine ROSneg diabetes  Renal/GU negative Renal ROS     Musculoskeletal negative musculoskeletal ROS (+)   Abdominal (+) - obese,   Peds  Hematology negative hematology ROS (+)   Anesthesia Other Findings Past Medical History: No date: Acetabular labrum tear     Comment:  R shoulder  No date: Acne No date: Asthma     Comment:  exercise induced, no problems in quite a while, last use              of inhaler- >one year No date: Hypoglycemia     Comment:  uses glucose tablets PRN No date: Seasonal allergies   Reproductive/Obstetrics                             Anesthesia Physical Anesthesia Plan  ASA: II  Anesthesia Plan: General   Post-op Pain Management:    Induction: Intravenous  PONV Risk Score and Plan: 2 and Ondansetron, Dexamethasone and Midazolam  Airway Management Planned: Oral ETT  Additional Equipment:   Intra-op Plan:   Post-operative Plan: Extubation in OR  Informed Consent: I have reviewed the patients History and Physical, chart, labs and discussed the procedure including the  risks, benefits and alternatives for the proposed anesthesia with the patient or authorized representative who has indicated his/her understanding and acceptance.     Dental advisory given  Plan Discussed with: CRNA and Anesthesiologist  Anesthesia Plan Comments:         Anesthesia Quick Evaluation

## 2018-12-31 NOTE — Op Note (Signed)
Preoperative diagnosis:  Abdominal pain  postoperative diagnosis:  Internal hernia  Procedure: Diagnostic laparoscopy, reduction of internal hernia  Anesthesia: GETA  Surgeon: Benjamine Sprague Assistant: Windell Moment for second opinion and assistance with visualization and closure  Wound Classification: clean contaminated  Specimen:  None  Complications: None apparent  Estimated Blood Loss: 30 mL  Indications: Patient is a 28 y.o. female  status post Meckel's diverticulitis bowel resection.  She had increasing abdominal pain and increased serous fluid drainage from her JP drain.  CT scan did not show an obvious anastomotic leak or any other issues due to the large amount of drainage from her JP along with increasing abdominal pain.  Decision was made to proceed with diagnostic laparoscopy to see if there is any pathology.  Please see progress notes for further details.  FIndings: 1.    Serous fluid throughout abdomen 2.    No obvious pathology or injury to any organ  3.    Swollen, congestion, increased erythematous section of small bowel surrounding the newly created anastomosis, along with scant exudative buildup, with soft scar tissue formation surrounding the entire area 4.    Visualization of improving mucosa turning to pink and decreased swelling area of concern post reduction 5. Adequate hemostasis.   Description of procedure:  Patient transferred to the operating room and onto the operating table in supine position.  Antibiotics from the floor were continued.  Area was prepped and draped in usual fashion.  Timeout was performed.  The right upper quadrant incision was opened and dissection carried down to fascia where a 5 mm port was placed via the Optiview technique.  After successful entrance in the abdominal cavity insufflation was started to 15 mmHg without a dramatic increase in pressure.  Camera was placed back into the abdominal cavity and no injury was noted to the  surrounding organs.  Under direct visualization a second 5 mm port was placed in the left flank 5 mm port incision from the previous procedure.  Area was infused with local prior to insertion.  Inspection of the abdominal cavity noted increase serous fluid that was noted in the JP drainage, along with extensive soft but dense adhesions around the area of the anastomosis.  This was easily taken down with blunt dissection, but due to the dilated loops of bowel and extremely limited space, decision was made to proceed with an open conversion for further inspection.  Camera and ports were removed under direct visualization, and the infraumbilical midline incision was reopened after removing the staples.  Inspection of the abdominal cavity did note the area of anastomosis with increased erythema and swelling.  The soft adhesions that were forming were very gently broken apart via finger dissection, and the segment of bowel was able to be brought out of the abdominal cavity.    Inspection of the bowel was noted again a swollen, erythematous, irritated bowel mucosa but with no evidence of perforation in any area.  Incision was then extended further up towards the umbilicus, and the entire bowel was ran several times to ensure there is no occult injury.  Besides the area described above, mainly in bowel all the way to the cecum was grossly normal.  The surrounding abdominal wall structures around the area of concern also did not have any obvious damage including the bladder.  After several minutes of observation as well as a second opinion from Dr. Windell Moment, it was noted that the area of erythema and swelling was improving.  Conclusion was  made that the bowel likely was experiencing internal hernia that was reduced during dissection and lysis of adhesions at the beginning of the procedure.  And the increased venous congestion causing extracellular fluid buildup was what was noted in the JP drain.  Bowel contents were  then reduced back into the abdominal cavity.  Hemostasis noted, JP drain was replaced right on top of the anastomosis, and the midline incision was closed with 1 PDS x2 in a running fashion, 3-0 Vicryl in a interrupted deep dermal fashion, and incision approximated with staples.  The port sites were closed with 3-0 Vicryl in a deep dermal interrupted fashion, and then running 4-0 Monocryl in a subcuticular fashion.  All incisions then dressed with honeycomb dressing.  OG tube placed initially at the end of the procedure was replaced with an NG prior to fascial closure.  Tip confirmation noted within the stomach with palpation.  Patient was then extubated and transferred to the PACU in stable condition.  Sponge and instrument count correct at end of procedure.

## 2019-01-01 LAB — PHOSPHORUS: Phosphorus: 4.3 mg/dL (ref 2.5–4.6)

## 2019-01-01 LAB — BASIC METABOLIC PANEL
Anion gap: 12 (ref 5–15)
BUN: 10 mg/dL (ref 6–20)
CO2: 25 mmol/L (ref 22–32)
Calcium: 8.2 mg/dL — ABNORMAL LOW (ref 8.9–10.3)
Chloride: 99 mmol/L (ref 98–111)
Creatinine, Ser: 0.68 mg/dL (ref 0.44–1.00)
GFR calc Af Amer: >60 mL/min (ref >60)
GFR calc non Af Amer: >60 mL/min (ref >60)
Glucose, Bld: 103 mg/dL — ABNORMAL HIGH (ref 70–99)
Potassium: 3.8 mmol/L (ref 3.5–5.1)
Sodium: 136 mmol/L (ref 135–145)

## 2019-01-01 LAB — CBC WITH DIFFERENTIAL/PLATELET
Abs Immature Granulocytes: 0.05 10*3/uL (ref 0.00–0.07)
Basophils Absolute: 0 10*3/uL (ref 0.0–0.1)
Basophils Relative: 0 %
Eosinophils Absolute: 0 10*3/uL (ref 0.0–0.5)
Eosinophils Relative: 0 %
HCT: 37.8 % (ref 36.0–46.0)
Hemoglobin: 12.6 g/dL (ref 12.0–15.0)
Immature Granulocytes: 1 %
Lymphocytes Relative: 14 %
Lymphs Abs: 1.4 10*3/uL (ref 0.7–4.0)
MCH: 29.8 pg (ref 26.0–34.0)
MCHC: 33.3 g/dL (ref 30.0–36.0)
MCV: 89.4 fL (ref 80.0–100.0)
Monocytes Absolute: 0.9 10*3/uL (ref 0.1–1.0)
Monocytes Relative: 9 %
Neutro Abs: 7.7 10*3/uL (ref 1.7–7.7)
Neutrophils Relative %: 76 %
Platelets: 332 10*3/uL (ref 150–400)
RBC: 4.23 MIL/uL (ref 3.87–5.11)
RDW: 12.5 % (ref 11.5–15.5)
WBC: 10.1 10*3/uL (ref 4.0–10.5)
nRBC: 0 % (ref 0.0–0.2)

## 2019-01-01 LAB — GLUCOSE, CAPILLARY
Glucose-Capillary: 120 mg/dL — ABNORMAL HIGH (ref 70–99)
Glucose-Capillary: 89 mg/dL (ref 70–99)
Glucose-Capillary: 92 mg/dL (ref 70–99)
Glucose-Capillary: 104 mg/dL — ABNORMAL HIGH (ref 70–99)

## 2019-01-01 LAB — MAGNESIUM: Magnesium: 1.7 mg/dL (ref 1.7–2.4)

## 2019-01-01 MED ORDER — LORAZEPAM 2 MG/ML IJ SOLN
1.0000 mg | Freq: Four times a day (QID) | INTRAMUSCULAR | Status: DC | PRN
  Administered 2019-01-01 (×2): 1 mg via INTRAVENOUS
  Filled 2019-01-01 (×2): qty 1

## 2019-01-01 MED ORDER — KETOROLAC TROMETHAMINE 30 MG/ML IJ SOLN
30.0000 mg | Freq: Four times a day (QID) | INTRAMUSCULAR | Status: DC
  Administered 2019-01-01 – 2019-01-04 (×12): 30 mg via INTRAVENOUS
  Filled 2019-01-01 (×13): qty 1

## 2019-01-01 MED ORDER — ACETAMINOPHEN 10 MG/ML IV SOLN
1000.0000 mg | Freq: Four times a day (QID) | INTRAVENOUS | Status: AC
  Administered 2019-01-01 – 2019-01-02 (×4): 1000 mg via INTRAVENOUS
  Filled 2019-01-01 (×4): qty 100

## 2019-01-01 MED ORDER — SODIUM CHLORIDE 0.9 % IV SOLN
INTRAVENOUS | Status: DC | PRN
  Administered 2019-01-01: 01:00:00 250 mL via INTRAVENOUS
  Administered 2019-01-02 (×2): 20 mL via INTRAVENOUS

## 2019-01-01 NOTE — Progress Notes (Signed)
POD # 1 s/p laparotomy and reduction of internal hernia POD #  4 s/p SB resection for perforated Meckels  Pain is the main issue. Feeling a bit better than before last surgery No flatus AVSS NGT 850cc JP 190cc serous Labs bmp and cbc ok   PE NAD, service do at bedside Abd: soft, dressing intact. JP serous fluid, no peritonitis Ext: well perfused   A/P doing well considering two major abd operation Schedule toradol and IV tylenol, may need prn ativan prn anxiety and muscle spasms Ice packs Mobilize Keep NGT for  anticipated ileus

## 2019-01-01 NOTE — Progress Notes (Signed)
Patient wanted the NG tube out. Stated it was making her gag and it was pointless to have in. Notified Dr Dahlia Byes that she had plans to pull it and was told to let her know it needed to stay in but if she pulled it and vomited it would have to go back in. Patient pulled her NG.

## 2019-01-02 ENCOUNTER — Encounter: Payer: Self-pay | Admitting: Surgery

## 2019-01-02 LAB — CBC WITH DIFFERENTIAL/PLATELET
Abs Immature Granulocytes: 0.04 10*3/uL (ref 0.00–0.07)
Basophils Absolute: 0 10*3/uL (ref 0.0–0.1)
Basophils Relative: 0 %
Eosinophils Absolute: 0.4 10*3/uL (ref 0.0–0.5)
Eosinophils Relative: 5 %
HCT: 32.9 % — ABNORMAL LOW (ref 36.0–46.0)
Hemoglobin: 11 g/dL — ABNORMAL LOW (ref 12.0–15.0)
Immature Granulocytes: 0 %
Lymphocytes Relative: 22 %
Lymphs Abs: 2 10*3/uL (ref 0.7–4.0)
MCH: 30.2 pg (ref 26.0–34.0)
MCHC: 33.4 g/dL (ref 30.0–36.0)
MCV: 90.4 fL (ref 80.0–100.0)
Monocytes Absolute: 0.9 10*3/uL (ref 0.1–1.0)
Monocytes Relative: 10 %
Neutro Abs: 5.6 10*3/uL (ref 1.7–7.7)
Neutrophils Relative %: 63 %
Platelets: 281 10*3/uL (ref 150–400)
RBC: 3.64 MIL/uL — ABNORMAL LOW (ref 3.87–5.11)
RDW: 12.6 % (ref 11.5–15.5)
WBC: 9 10*3/uL (ref 4.0–10.5)
nRBC: 0 % (ref 0.0–0.2)

## 2019-01-02 LAB — PHOSPHORUS: Phosphorus: 3.1 mg/dL (ref 2.5–4.6)

## 2019-01-02 LAB — BASIC METABOLIC PANEL
Anion gap: 12 (ref 5–15)
BUN: 10 mg/dL (ref 6–20)
CO2: 25 mmol/L (ref 22–32)
Calcium: 8.3 mg/dL — ABNORMAL LOW (ref 8.9–10.3)
Chloride: 99 mmol/L (ref 98–111)
Creatinine, Ser: 0.8 mg/dL (ref 0.44–1.00)
GFR calc Af Amer: >60 mL/min (ref >60)
GFR calc non Af Amer: >60 mL/min (ref >60)
Glucose, Bld: 71 mg/dL (ref 70–99)
Potassium: 3.6 mmol/L (ref 3.5–5.1)
Sodium: 136 mmol/L (ref 135–145)

## 2019-01-02 LAB — GLUCOSE, CAPILLARY
Glucose-Capillary: 66 mg/dL — ABNORMAL LOW (ref 70–99)
Glucose-Capillary: 98 mg/dL (ref 70–99)
Glucose-Capillary: 70 mg/dL (ref 70–99)
Glucose-Capillary: 86 mg/dL (ref 70–99)
Glucose-Capillary: 105 mg/dL — ABNORMAL HIGH (ref 70–99)
Glucose-Capillary: 112 mg/dL — ABNORMAL HIGH (ref 70–99)

## 2019-01-02 LAB — MAGNESIUM: Magnesium: 1.8 mg/dL (ref 1.7–2.4)

## 2019-01-02 MED ORDER — SIMETHICONE 80 MG PO CHEW
80.0000 mg | CHEWABLE_TABLET | Freq: Four times a day (QID) | ORAL | Status: DC | PRN
  Administered 2019-01-02 – 2019-01-03 (×2): 80 mg via ORAL
  Filled 2019-01-02 (×3): qty 1

## 2019-01-02 MED ORDER — DEXTROSE 50 % IV SOLN
INTRAVENOUS | Status: AC
  Filled 2019-01-02: qty 50

## 2019-01-02 MED ORDER — OXYCODONE HCL 5 MG PO TABS
10.0000 mg | ORAL_TABLET | ORAL | Status: DC | PRN
  Administered 2019-01-02 – 2019-01-03 (×4): 10 mg via ORAL
  Filled 2019-01-02 (×4): qty 2

## 2019-01-02 MED ORDER — DEXTROSE 50 % IV SOLN
25.0000 mL | Freq: Once | INTRAVENOUS | Status: AC
  Administered 2019-01-02: 08:00:00 25 mL via INTRAVENOUS

## 2019-01-02 MED ORDER — ACETAMINOPHEN 500 MG PO TABS
1000.0000 mg | ORAL_TABLET | Freq: Four times a day (QID) | ORAL | Status: DC
  Administered 2019-01-02 – 2019-01-04 (×6): 1000 mg via ORAL
  Filled 2019-01-02 (×8): qty 2

## 2019-01-02 NOTE — Progress Notes (Signed)
CC: POD # 1 s/p laparotomy and reduction of internal hernia POD #  4 s/p SB resection for perforated Meckels  Subjective: puklled ngt out yesterday. + flatus Feeling better Ativan helped her w muscle spams jp 150cc service dog at bedside Good uo  Objective: Vital signs in last 24 hours: Temp:  [98.3 F (36.8 C)-99.1 F (37.3 C)] 98.8 F (37.1 C) (11/22 1146) Pulse Rate:  [75-89] 82 (11/22 1146) Resp:  [14-20] 14 (11/22 1146) BP: (101-127)/(69-78) 127/74 (11/22 1146) SpO2:  [99 %-100 %] 99 % (11/22 1146) Last BM Date: 12/26/18  Intake/Output from previous day: 11/21 0701 - 11/22 0700 In: 2224.4 [I.V.:1760.5; IV Piggyback:463.9] Out: 1450 [Urine:1100; Emesis/NG output:200; Drains:150] Intake/Output this shift: Total I/O In: 780 [P.O.:780] Out: 100 [Drains:100]  Physical exam:  NAD Abd: soft, dressing intact. No peritonitis, less distended Ext: no edema  Lab Results: CBC  Recent Labs    01/01/19 0608 01/02/19 0619  WBC 10.1 9.0  HGB 12.6 11.0*  HCT 37.8 32.9*  PLT 332 281   BMET Recent Labs    01/01/19 0608 01/02/19 0619  NA 136 136  K 3.8 3.6  CL 99 99  CO2 25 25  GLUCOSE 103* 71  BUN 10 10  CREATININE 0.68 0.80  CALCIUM 8.2* 8.3*   PT/INR No results for input(s): LABPROT, INR in the last 72 hours. ABG No results for input(s): PHART, HCO3 in the last 72 hours.  Invalid input(s): PCO2, PO2  Studies/Results: No results found.  Anti-infectives: Anti-infectives (From admission, onward)   Start     Dose/Rate Route Frequency Ordered Stop   12/28/18 1800  piperacillin-tazobactam (ZOSYN) IVPB 3.375 g     3.375 g 12.5 mL/hr over 240 Minutes Intravenous Every 8 hours 12/28/18 0843     12/28/18 1614  piperacillin-tazobactam (ZOSYN) 3.375 GM/50ML IVPB    Note to Pharmacy: Lyman Bishop   : cabinet override      12/28/18 1614 12/28/18 1727   12/28/18 0845  piperacillin-tazobactam (ZOSYN) IVPB 3.375 g     3.375 g 12.5 mL/hr over 240 Minutes  Intravenous  Once 12/28/18 0843 12/28/18 1314      Assessment/Plan: Doing much better today Clears mobilize Decrease ivf  Caroleen Hamman, MD, FACS  01/02/2019

## 2019-01-03 LAB — MAGNESIUM: Magnesium: 1.8 mg/dL (ref 1.7–2.4)

## 2019-01-03 LAB — CBC WITH DIFFERENTIAL/PLATELET
Abs Immature Granulocytes: 0.06 10*3/uL (ref 0.00–0.07)
Basophils Absolute: 0 10*3/uL (ref 0.0–0.1)
Basophils Relative: 0 %
Eosinophils Absolute: 0.4 10*3/uL (ref 0.0–0.5)
Eosinophils Relative: 5 %
HCT: 30.6 % — ABNORMAL LOW (ref 36.0–46.0)
Hemoglobin: 10.6 g/dL — ABNORMAL LOW (ref 12.0–15.0)
Immature Granulocytes: 1 %
Lymphocytes Relative: 20 %
Lymphs Abs: 1.7 10*3/uL (ref 0.7–4.0)
MCH: 30.2 pg (ref 26.0–34.0)
MCHC: 34.6 g/dL (ref 30.0–36.0)
MCV: 87.2 fL (ref 80.0–100.0)
Monocytes Absolute: 0.7 10*3/uL (ref 0.1–1.0)
Monocytes Relative: 9 %
Neutro Abs: 5.3 10*3/uL (ref 1.7–7.7)
Neutrophils Relative %: 65 %
Platelets: 273 10*3/uL (ref 150–400)
RBC: 3.51 MIL/uL — ABNORMAL LOW (ref 3.87–5.11)
RDW: 12.4 % (ref 11.5–15.5)
WBC: 8.2 10*3/uL (ref 4.0–10.5)
nRBC: 0 % (ref 0.0–0.2)

## 2019-01-03 LAB — GLUCOSE, CAPILLARY
Glucose-Capillary: 84 mg/dL (ref 70–99)
Glucose-Capillary: 111 mg/dL — ABNORMAL HIGH (ref 70–99)
Glucose-Capillary: 113 mg/dL — ABNORMAL HIGH (ref 70–99)
Glucose-Capillary: 102 mg/dL — ABNORMAL HIGH (ref 70–99)

## 2019-01-03 LAB — BASIC METABOLIC PANEL
Anion gap: 9 (ref 5–15)
BUN: 7 mg/dL (ref 6–20)
CO2: 24 mmol/L (ref 22–32)
Calcium: 8 mg/dL — ABNORMAL LOW (ref 8.9–10.3)
Chloride: 104 mmol/L (ref 98–111)
Creatinine, Ser: 0.63 mg/dL (ref 0.44–1.00)
GFR calc Af Amer: >60 mL/min (ref >60)
GFR calc non Af Amer: >60 mL/min (ref >60)
Glucose, Bld: 80 mg/dL (ref 70–99)
Potassium: 3.7 mmol/L (ref 3.5–5.1)
Sodium: 137 mmol/L (ref 135–145)

## 2019-01-03 LAB — PHOSPHORUS: Phosphorus: 3.8 mg/dL (ref 2.5–4.6)

## 2019-01-03 NOTE — Progress Notes (Signed)
Subjective:  CC: Paula Cooley is a 28 y.o. female  Hospital stay day 5, 3 Days Post-Op robotic assisted laparoscopic converted to open Meckel's diverticulitis resection  HPI: Tolerating yogurt.  Ambulated with some discomfort but still feeling ok.  JP output remains serous.  Passing flatus.   ROS:  General: Denies weight loss, weight gain, fatigue, fevers, chills, and night sweats. Heart: Denies chest pain, palpitations, racing heart, irregular heartbeat, leg pain or swelling, and decreased activity tolerance. Respiratory: Denies breathing difficulty, shortness of breath, wheezing, cough, and sputum. GI: Denies change in appetite, heartburn, vomiting, constipation, diarrhea, and blood in stool. GU: Denies difficulty urinating, pain with urinating, urgency, frequency, blood in urine.   Objective:   Temp:  [98.3 F (36.8 C)-98.6 F (37 C)] 98.6 F (37 C) (11/23 1143) Pulse Rate:  [77-78] 77 (11/23 1143) Resp:  [15-18] 15 (11/23 1143) BP: (96-116)/(64-81) 96/64 (11/23 1143) SpO2:  [98 %-99 %] 98 % (11/23 1143)     Height: 5' 6"  (167.6 cm) Weight: 73.3 kg BMI (Calculated): 26.09   Intake/Output this shift:   Intake/Output Summary (Last 24 hours) at 01/03/2019 1512 Last data filed at 01/03/2019 1400 Gross per 24 hour  Intake 2109.26 ml  Output 1395 ml  Net 714.26 ml    Constitutional :  alert, cooperative, appears stated age and no distress  Respiratory:  clear to auscultation bilaterally  Cardiovascular:  regular rate and rhythm  Gastrointestinal: Soft, no guarding, slight tympany, least tenderness in the bilateral lower paracolic gutters and suprapubic region, but most improved since admission.  JP still with clear serous discharge, slight increase in output.  Staples and port incisions clean dry and intact..   Skin: Cool and moist.   Psychiatric: Normal affect, non-agitated, not confused       LABS:  CMP Latest Ref Rng & Units 01/03/2019 01/02/2019 01/01/2019  Glucose 70  - 99 mg/dL 80 71 103(H)  BUN 6 - 20 mg/dL 7 10 10   Creatinine 0.44 - 1.00 mg/dL 0.63 0.80 0.68  Sodium 135 - 145 mmol/L 137 136 136  Potassium 3.5 - 5.1 mmol/L 3.7 3.6 3.8  Chloride 98 - 111 mmol/L 104 99 99  CO2 22 - 32 mmol/L 24 25 25   Calcium 8.9 - 10.3 mg/dL 8.0(L) 8.3(L) 8.2(L)  Total Protein 6.5 - 8.1 g/dL - - -  Total Bilirubin 0.3 - 1.2 mg/dL - - -  Alkaline Phos 38 - 126 U/L - - -  AST 15 - 41 U/L - - -  ALT 0 - 44 U/L - - -   CBC Latest Ref Rng & Units 01/03/2019 01/02/2019 01/01/2019  WBC 4.0 - 10.5 K/uL 8.2 9.0 10.1  Hemoglobin 12.0 - 15.0 g/dL 10.6(L) 11.0(L) 12.6  Hematocrit 36.0 - 46.0 % 30.6(L) 32.9(L) 37.8  Platelets 150 - 400 K/uL 273 281 332    RADS: n/a Assessment:   S/p robotic assisted laparoscopic converted to open Meckel's diverticulitis resection.  S/p re-exploration for worsening abdominal exam and large increased JP output.  Continues to do well on full liquids.  Continue present management for rest of today and continue to monitor.  Walker to use at home while recovering.

## 2019-01-03 NOTE — Progress Notes (Signed)
Subjective:  CC: Paula Cooley is a 28 y.o. female  Hospital stay day 5, 3 Days Post-Op robotic assisted laparoscopic converted to open Meckel's diverticulitis resection  HPI: Slight nausea overnight, but feeling much better this am.  Some flatus early evening.  Tolerated clears for lunch and dinner.     ROS:  General: Denies weight loss, weight gain, fatigue, fevers, chills, and night sweats. Heart: Denies chest pain, palpitations, racing heart, irregular heartbeat, leg pain or swelling, and decreased activity tolerance. Respiratory: Denies breathing difficulty, shortness of breath, wheezing, cough, and sputum. GI: Denies change in appetite, heartburn, vomiting, constipation, diarrhea, and blood in stool. GU: Denies difficulty urinating, pain with urinating, urgency, frequency, blood in urine.   Objective:   Temp:  [98.3 F (36.8 C)-98.8 F (37.1 C)] 98.3 F (36.8 C) (11/23 0548) Pulse Rate:  [77-82] 77 (11/23 0548) Resp:  [14-18] 16 (11/23 0548) BP: (106-127)/(72-81) 106/72 (11/23 0548) SpO2:  [99 %] 99 % (11/23 0548)     Height: 5' 6"  (167.6 cm) Weight: 73.3 kg BMI (Calculated): 26.09   Intake/Output this shift:   Intake/Output Summary (Last 24 hours) at 01/03/2019 0720 Last data filed at 01/03/2019 0557 Gross per 24 hour  Intake 2685.37 ml  Output 1920 ml  Net 765.37 ml    Constitutional :  alert, cooperative, appears stated age and no distress  Respiratory:  clear to auscultation bilaterally  Cardiovascular:  regular rate and rhythm  Gastrointestinal: Soft, no guarding, slight tympany, least tenderness in the bilateral lower paracolic gutters and suprapubic region, but most improved since admission.  JP still with clear serous discharge, slight increase in output.  Staples and port incisions clean dry and intact..   Skin: Cool and moist.   Psychiatric: Normal affect, non-agitated, not confused       LABS:  CMP Latest Ref Rng & Units 01/03/2019 01/02/2019 01/01/2019   Glucose 70 - 99 mg/dL 80 71 103(H)  BUN 6 - 20 mg/dL 7 10 10   Creatinine 0.44 - 1.00 mg/dL 0.63 0.80 0.68  Sodium 135 - 145 mmol/L 137 136 136  Potassium 3.5 - 5.1 mmol/L 3.7 3.6 3.8  Chloride 98 - 111 mmol/L 104 99 99  CO2 22 - 32 mmol/L 24 25 25   Calcium 8.9 - 10.3 mg/dL 8.0(L) 8.3(L) 8.2(L)  Total Protein 6.5 - 8.1 g/dL - - -  Total Bilirubin 0.3 - 1.2 mg/dL - - -  Alkaline Phos 38 - 126 U/L - - -  AST 15 - 41 U/L - - -  ALT 0 - 44 U/L - - -   CBC Latest Ref Rng & Units 01/03/2019 01/02/2019 01/01/2019  WBC 4.0 - 10.5 K/uL 8.2 9.0 10.1  Hemoglobin 12.0 - 15.0 g/dL 10.6(L) 11.0(L) 12.6  Hematocrit 36.0 - 46.0 % 30.6(L) 32.9(L) 37.8  Platelets 150 - 400 K/uL 273 281 332    RADS: n/a Assessment:   S/p robotic assisted laparoscopic converted to open Meckel's diverticulitis resection.  S/p re-exploration for worsening abdominal exam and large increased JP output.  Looking and feeling well as good as she has been since admission.  Will advance to full liquids and continue to monitor JP output and pain.  No BM yet, will continue abx as well.  Stop IVF

## 2019-01-04 LAB — CBC WITH DIFFERENTIAL/PLATELET
Abs Immature Granulocytes: 0.05 10*3/uL (ref 0.00–0.07)
Basophils Absolute: 0 10*3/uL (ref 0.0–0.1)
Basophils Relative: 0 %
Eosinophils Absolute: 0.4 10*3/uL (ref 0.0–0.5)
Eosinophils Relative: 5 %
HCT: 35.5 % — ABNORMAL LOW (ref 36.0–46.0)
Hemoglobin: 11.7 g/dL — ABNORMAL LOW (ref 12.0–15.0)
Immature Granulocytes: 1 %
Lymphocytes Relative: 21 %
Lymphs Abs: 1.6 10*3/uL (ref 0.7–4.0)
MCH: 29.9 pg (ref 26.0–34.0)
MCHC: 33 g/dL (ref 30.0–36.0)
MCV: 90.8 fL (ref 80.0–100.0)
Monocytes Absolute: 0.5 10*3/uL (ref 0.1–1.0)
Monocytes Relative: 6 %
Neutro Abs: 5.2 10*3/uL (ref 1.7–7.7)
Neutrophils Relative %: 67 %
Platelets: 364 10*3/uL (ref 150–400)
RBC: 3.91 MIL/uL (ref 3.87–5.11)
RDW: 12.5 % (ref 11.5–15.5)
WBC: 7.8 10*3/uL (ref 4.0–10.5)
nRBC: 0 % (ref 0.0–0.2)

## 2019-01-04 LAB — GLUCOSE, CAPILLARY
Glucose-Capillary: 74 mg/dL (ref 70–99)
Glucose-Capillary: 91 mg/dL (ref 70–99)
Glucose-Capillary: 131 mg/dL — ABNORMAL HIGH (ref 70–99)
Glucose-Capillary: 111 mg/dL — ABNORMAL HIGH (ref 70–99)

## 2019-01-04 LAB — BASIC METABOLIC PANEL
Anion gap: 9 (ref 5–15)
BUN: 8 mg/dL (ref 6–20)
CO2: 28 mmol/L (ref 22–32)
Calcium: 8.6 mg/dL — ABNORMAL LOW (ref 8.9–10.3)
Chloride: 104 mmol/L (ref 98–111)
Creatinine, Ser: 0.78 mg/dL (ref 0.44–1.00)
GFR calc Af Amer: >60 mL/min (ref >60)
GFR calc non Af Amer: >60 mL/min (ref >60)
Glucose, Bld: 94 mg/dL (ref 70–99)
Potassium: 3.9 mmol/L (ref 3.5–5.1)
Sodium: 141 mmol/L (ref 135–145)

## 2019-01-04 LAB — PHOSPHORUS: Phosphorus: 4.7 mg/dL — ABNORMAL HIGH (ref 2.5–4.6)

## 2019-01-04 LAB — MAGNESIUM: Magnesium: 2 mg/dL (ref 1.7–2.4)

## 2019-01-04 MED ORDER — AMOXICILLIN-POT CLAVULANATE 875-125 MG PO TABS
1.0000 | ORAL_TABLET | Freq: Two times a day (BID) | ORAL | Status: DC
  Administered 2019-01-04 – 2019-01-05 (×3): 1 via ORAL
  Filled 2019-01-04 (×3): qty 1

## 2019-01-04 MED ORDER — MORPHINE SULFATE (PF) 2 MG/ML IV SOLN
1.0000 mg | INTRAVENOUS | Status: DC | PRN
  Administered 2019-01-04 – 2019-01-05 (×4): 1 mg via INTRAVENOUS
  Filled 2019-01-04 (×4): qty 1

## 2019-01-04 MED ORDER — HYDROCODONE-ACETAMINOPHEN 5-325 MG PO TABS
1.0000 | ORAL_TABLET | ORAL | Status: DC | PRN
  Administered 2019-01-04: 1 via ORAL
  Filled 2019-01-04: qty 1

## 2019-01-04 MED ORDER — IBUPROFEN 400 MG PO TABS: 800.0000 mg | ORAL_TABLET | Freq: Four times a day (QID) | ORAL | Status: DC | PRN

## 2019-01-04 MED ORDER — ACETAMINOPHEN 325 MG PO TABS: 650.0000 mg | ORAL_TABLET | Freq: Four times a day (QID) | ORAL | Status: DC | PRN

## 2019-01-04 NOTE — Anesthesia Postprocedure Evaluation (Signed)
Anesthesia Post Note  Patient: Paula Cooley  Procedure(s) Performed: LAPAROSCOPY DIAGNOSTIC (N/A Abdomen) EXPLORATORY LAPAROTOMY WITH REDUCTION OF HERNIA (N/A Abdomen)  Patient location during evaluation: PACU Anesthesia Type: General Level of consciousness: awake and alert and oriented Pain management: pain level controlled Vital Signs Assessment: post-procedure vital signs reviewed and stable Respiratory status: spontaneous breathing Cardiovascular status: blood pressure returned to baseline Anesthetic complications: no     Last Vitals:  Vitals:   01/03/19 2132 01/04/19 0454  BP: 115/78 105/72  Pulse: 72 64  Resp: 16 16  Temp: 36.6 C (!) 36.4 C  SpO2: 100% 100%    Last Pain:  Vitals:   01/04/19 0509  TempSrc:   PainSc: Asleep                 Noralee Dutko

## 2019-01-04 NOTE — Progress Notes (Signed)
Subjective:  CC: Paula Cooley is a 28 y.o. female  Hospital stay day 6, 4 Days Post-Op robotic assisted laparoscopic converted to open Meckel's diverticulitis resection  HPI: Tolerating yogurt and mac and cheese.  Doing well.  Drain remain serous, no significant increase in output.  Pain controlled. Passing flatus   ROS:  General: Denies weight loss, weight gain, fatigue, fevers, chills, and night sweats. Heart: Denies chest pain, palpitations, racing heart, irregular heartbeat, leg pain or swelling, and decreased activity tolerance. Respiratory: Denies breathing difficulty, shortness of breath, wheezing, cough, and sputum. GI: Denies change in appetite, heartburn, vomiting, constipation, diarrhea, and blood in stool. GU: Denies difficulty urinating, pain with urinating, urgency, frequency, blood in urine.   Objective:   Temp:  [97.5 F (36.4 C)-98.7 F (37.1 C)] 98.7 F (37.1 C) (11/24 1240) Pulse Rate:  [64-72] 72 (11/24 1240) Resp:  [16] 16 (11/24 1240) BP: (105-115)/(72-78) 109/72 (11/24 1240) SpO2:  [99 %-100 %] 99 % (11/24 1240)     Height: _0  (167.6 cm) Weight: 73.3 kg BMI (Calculated): 26.09   Intake/Output this shift:   Intake/Output Summary (Last 24 hours) at 01/04/2019 1352 Last data filed at 01/04/2019 1208 Gross per 24 hour  Intake 139.96 ml  Output 1365 ml  Net -1225.04 ml  JP with 251m/24hrs, stable since last 24hrs.   Constitutional :  alert, cooperative, appears stated age and no distress  Respiratory:  clear to auscultation bilaterally  Cardiovascular:  regular rate and rhythm  Gastrointestinal: Soft, no guarding, slight tympany, decreasing tenderness in the bilateral lower paracolic gutters and suprapubic region.  JP still with clear serous discharge.  Staples and port incisions clean dry and intact..   Skin: Cool and moist.   Psychiatric: Normal affect, non-agitated, not confused       LABS:  CMP Latest Ref Rng & Units 01/04/2019 01/03/2019  01/02/2019  Glucose 70 - 99 mg/dL 94 80 71  BUN 6 - 20 mg/dL _1 Creatinine 0.44 - 1.00 mg/dL 0.78 0.63 0.80  Sodium 135 - 145 mmol/L 141 137 136  Potassium 3.5 - 5.1 mmol/L 3.9 3.7 3.6  Chloride 98 - 111 mmol/L 104 104 99  CO2 22 - 32 mmol/L _2 Calcium 8.9 - 10.3 mg/dL 8.6(L) 8.0(L) 8.3(L)  Total Protein 6.5 - 8.1 g/dL - - -  Total Bilirubin 0.3 - 1.2 mg/dL - - -  Alkaline Phos 38 - 126 U/L - - -  AST 15 - 41 U/L - - -  ALT 0 - 44 U/L - - -   CBC Latest Ref Rng & Units 01/04/2019 01/03/2019 01/02/2019  WBC 4.0 - 10.5 K/uL 7.8 8.2 9.0  Hemoglobin 12.0 - 15.0 g/dL 11.7(L) 10.6(L) 11.0(L)  Hematocrit 36.0 - 46.0 % 35.5(L) 30.6(L) 32.9(L)  Platelets 150 - 400 K/uL 364 273 281    RADS: n/a Assessment:   S/p robotic assisted laparoscopic converted to open Meckel's diverticulitis resection.  S/p re-exploration for worsening abdominal exam and large increased JP output.  Continues to do well.  Likely home tomorrow if tolerated regular diet today and no significant change in JP output, pain.

## 2019-01-05 LAB — PHOSPHORUS: Phosphorus: 3.8 mg/dL (ref 2.5–4.6)

## 2019-01-05 LAB — GLUCOSE, CAPILLARY
Glucose-Capillary: 86 mg/dL (ref 70–99)
Glucose-Capillary: 96 mg/dL (ref 70–99)

## 2019-01-05 MED ORDER — DOCUSATE SODIUM 100 MG PO CAPS: 100.0000 mg | ORAL_CAPSULE | Freq: Two times a day (BID) | ORAL | 0 refills | Status: AC | PRN

## 2019-01-05 MED ORDER — AMOXICILLIN-POT CLAVULANATE 875-125 MG PO TABS: 1.0000 | ORAL_TABLET | Freq: Two times a day (BID) | ORAL | 0 refills | Status: AC

## 2019-01-05 MED ORDER — HYDROCODONE-ACETAMINOPHEN 5-325 MG PO TABS: 1.0000 | ORAL_TABLET | Freq: Four times a day (QID) | ORAL | 0 refills | Status: DC | PRN

## 2019-01-05 MED ORDER — ACETAMINOPHEN 325 MG PO TABS: 650.0000 mg | ORAL_TABLET | Freq: Three times a day (TID) | ORAL | 0 refills | Status: AC | PRN

## 2019-01-05 MED ORDER — IBUPROFEN 800 MG PO TABS: 800.0000 mg | ORAL_TABLET | Freq: Three times a day (TID) | ORAL | 0 refills | Status: DC | PRN

## 2019-01-05 NOTE — Progress Notes (Signed)
Discharge order received. Patient mental status is at baseline. Vital signs stable . No signs of acute distress. Discharge instructions given. Patient verbalized understanding. No other issues noted at this time.

## 2019-01-05 NOTE — Discharge Instructions (Signed)
Bowel resection, Care After This sheet gives you information about how to care for yourself after your procedure. Your health care provider may also give you more specific instructions. If you have problems or questions, contact your health care provider. What can I expect after the procedure? After your procedure, it is common to have the following:  Pain in your abdomen, especially in the incision areas. You will be given medicine to control the pain.  Tiredness. This is a normal part of the recovery process. Your energy level will return to normal over the next several weeks.  Changes in your bowel movements, such as constipation or needing to go more often. Talk with your health care provider about how to manage this. Follow these instructions at home: Medicines   tylenol and advil as needed for discomfort.  Please alternate between the two every four hours as needed for pain.     Use narcotics, if prescribed, only when tylenol and motrin is not enough to control pain.   325-696m every 8hrs to max of 40077m24hrs (including the 32531mn every norco dose) for the tylenol.     Advil up to 800m51mr dose every 8hrs as needed for pain.    Do not drive or use heavy machinery while taking prescription pain medicine.  Do not drink alcohol while taking prescription pain medicine.  If you were prescribed an antibiotic medicine, use it as told by your health care provider. Do not stop using the antibiotic even if you start to feel better. Incision care     Follow instructions from your health care provider about how to take care of your incision areas. Make sure you: ? Keep your incisions clean and dry. ? Wash your hands with soap and water before and after applying medicine to the areas, and before and after changing your bandage (dressing). If soap and water are not available, use hand sanitizer. ? Change your dressing as told by your health care provider. ? Leave stitches (sutures),  skin glue, or adhesive strips in place. These skin closures may need to stay in place for 2 weeks or longer. If adhesive strip edges start to loosen and curl up, you may trim the loose edges. Do not remove adhesive strips completely unless your health care provider tells you to do that.  Do not wear tight clothing over the incisions. Tight clothing may rub and irritate the incision areas, which may cause the incisions to open.  Do not take baths, swim, or use a hot tub until your health care provider approves. OK TO SHOWER.    MEASURE DAILY JP DRAIN OUTPUT  Check your incision area every day for signs of infection. Check for: ? More redness, swelling, or pain. ? More fluid or blood. ? Warmth. ? Pus or a bad smell. Activity  Avoid lifting anything that is heavier than 10 lb (4.5 kg) for 2 weeks or until your health care provider says it is okay.  You may resume normal activities as told by your health care provider. Ask your health care provider what activities are safe for you.  Take rest breaks during the day as needed. Eating and drinking  Follow instructions from your health care provider about what you can eat after surgery.  To prevent or treat constipation while you are taking prescription pain medicine, your health care provider may recommend that you: ? Drink enough fluid to keep your urine clear or pale yellow. ? Take over-the-counter or prescription medicines. ? Eat  foods that are high in fiber, such as fresh fruits and vegetables, whole grains, and beans. ? Limit foods that are high in fat and processed sugars, such as fried and sweet foods. General instructions  Ask your health care provider when you will need an appointment to get your sutures or staples removed.  Keep all follow-up visits as told by your health care provider. This is important. Contact a health care provider if:  You have more redness, swelling, or pain around your incisions.  You have more fluid  or blood coming from the incisions.  Your incisions feel warm to the touch.  You have pus or a bad smell coming from your incisions or your dressing.  You have a fever.  You have an incision that breaks open (edges not staying together) after sutures or staples have been removed. Get help right away if:  You develop a rash.  You have chest pain or difficulty breathing.  You have pain or swelling in your legs.  You feel light-headed or you faint.  Your abdomen swells (becomes distended).  You have nausea or vomiting.  You have blood in your stool (feces). This information is not intended to replace advice given to you by your health care provider. Make sure you discuss any questions you have with your health care provider. Document Released: 08/16/2004 Document Revised: 10/16/2017 Document Reviewed: 10/29/2015 Elsevier Interactive Patient Education  2019 [employer].

## 2019-01-05 NOTE — Care Management (Signed)
Rw delivered

## 2019-01-05 NOTE — Discharge Summary (Signed)
Physician Discharge Summary  Patient ID: Paula Cooley MRN: [id number] DOB/AGE: [birth year] 27 y.o.  Admit date: 12/27/2018 Discharge date: 01/05/2019  Admission Diagnoses: Abdominal pain  Discharge Diagnoses:  Perforated Meckel's diverticulitis  Discharged Condition: good  Hospital Course: Admitted for above.  Underwent diagnostic laparoscopy and noted to have a perforated Meckel's diverticulitis Intra-Op.  Proceeded with resection and anastomosis.  Please see op note for further details.  Patient seems to be recovering well postop with NG tube removal and advancement of diet.  However around postop day 3, noted to have increasing JP output along with persistent nausea and emesis.  Abdominal pain also started to increase as well.  With the large amount of JP output and increasing abdominal pain along with signs of ileus, concerns of possible perforation or anastomosis breakdown.  Therefore, patient was taken back to the OR for an exploratory laparotomy, which noted likely a internal hernia, which was reduced.  Again please see op note for further details.    After the exploratory laparotomy, patient did slowly recover with gradual advancement of diet.  By the time of discharge patient was tolerating a regular diet, having a bowel movement, pain was controlled with oral pain meds, and the JP output has remained relatively low and serous.  Patient was on IV antibiotics throughout the entire hospital stay 1 complete the full course with oral antibiotics at home secondary to the initial perforation.  She will be discharged with the JP drain to further monitor for any changes in output quality of output until her next follow-up appointment at which time her midline staples will likely be removed along with the drain.  Per patient request, a rolling walker will be provided at home for extra support while she is recovering. Consults: None  Discharge Exam: Blood pressure 112/60, pulse 68, temperature  98.5 F (36.9 C), temperature source Oral, resp. rate 16, height 5' 6"  (1.676 m), weight 73.3 kg, SpO2 100 %. General appearance: alert, cooperative and no distress GI: soft, non-tender; bowel sounds normal; no masses,  no organomegaly and Incisions clean dry and intact, JP with serous fluid  Disposition:  Discharge disposition: 01-Home or Self Care       Discharge Instructions    Discharge patient   Complete by: As directed    Discharge disposition: 01-Home or Self Care   Discharge patient date: 01/05/2019     Allergies as of 01/05/2019      Reactions   No Known Allergies       Medication List    TAKE these medications   acetaminophen 325 MG tablet Commonly known as: Tylenol Take 2 tablets (650 mg total) by mouth every 8 (eight) hours as needed for mild pain.   amoxicillin-clavulanate 875-125 MG tablet Commonly known as: AUGMENTIN Take 1 tablet by mouth every 12 (twelve) hours for 3 days.   amphetamine-dextroamphetamine 20 MG tablet Commonly known as: Adderall Take 1 tablet (20 mg total) by mouth 2 (two) times daily. Start taking on: January 18, 2019 What changed: Another medication with the same name was removed. Continue taking this medication, and follow the directions you see here.   docusate sodium 100 MG capsule Commonly known as: Colace Take 1 capsule (100 mg total) by mouth 2 (two) times daily as needed for up to 10 days for mild constipation.   HYDROcodone-acetaminophen 5-325 MG tablet Commonly known as: Norco Take 1 tablet by mouth every 6 (six) hours as needed for up to 6 doses for moderate pain.  ibuprofen 800 MG tablet Commonly known as: ADVIL Take 1 tablet (800 mg total) by mouth every 8 (eight) hours as needed for mild pain or moderate pain.   Zovia 1/35E (28) 1-35 MG-MCG tablet Generic drug: ethynodiol-ethinyl estradiol Take 1 tablet by mouth daily.            Durable Medical Equipment  (From admission, onward)         Start      Ordered   01/03/19 1514  For home use only DME Walker  Once    Question:  Patient needs a walker to treat with the following condition  Answer:  Physical deconditioning   01/03/19 1514            Total time spent arranging discharge was >13mn. Signed: IBenjamine Sprague11/25/2020, 7:44 AM

## 2019-02-17 ENCOUNTER — Telehealth: Payer: Self-pay | Admitting: Family Medicine

## 2019-02-17 NOTE — Telephone Encounter (Signed)
Dismissal letter in guarantor snapshot  °

## 2019-04-05 ENCOUNTER — Telehealth: Payer: Self-pay | Admitting: Family Medicine

## 2019-04-05 NOTE — Telephone Encounter (Signed)
Returned call to [employer] ref [phone]. She gave patients correct DOB. I advised request received has incorrect DOB of 06/28/1993, which I returned advising we did not have pt with that DOB and unable to process. She stated she would update request with correct DOB and re send request.

## 2019-06-22 ENCOUNTER — Other Ambulatory Visit: Payer: Self-pay | Admitting: Family Medicine

## 2019-06-23 ENCOUNTER — Other Ambulatory Visit: Payer: Self-pay | Admitting: Family Medicine

## 2019-06-23 NOTE — Telephone Encounter (Signed)
Please refuse due to pt being dismissed. [place]

## 2019-06-24 NOTE — Telephone Encounter (Signed)
Please refuse due to pt being dismissed . [place]

## 2019-07-25 ENCOUNTER — Other Ambulatory Visit: Payer: Self-pay | Admitting: Family Medicine

## 2019-07-28 NOTE — Telephone Encounter (Signed)
Called pt to advised why she had not received her refills as she was dismissed in Jan 2021 and letter was sent out.  Re:  6/11/202 ov Reached vm, lmtrc.  Called other number on file, not good number.

## 2019-11-28 ENCOUNTER — Encounter: Payer: Self-pay | Admitting: Family Medicine

## 2019-11-28 ENCOUNTER — Telehealth (INDEPENDENT_AMBULATORY_CARE_PROVIDER_SITE_OTHER): Payer: Self-pay | Admitting: Family Medicine

## 2019-11-28 ENCOUNTER — Other Ambulatory Visit: Payer: Self-pay

## 2019-11-28 VITALS — Ht 66.0 in | Wt 161.0 lb

## 2019-11-28 DIAGNOSIS — F901 Attention-deficit hyperactivity disorder, predominantly hyperactive type: Secondary | ICD-10-CM

## 2019-11-28 MED ORDER — AMPHETAMINE-DEXTROAMPHETAMINE 20 MG PO TABS: 20.0000 mg | ORAL_TABLET | Freq: Two times a day (BID) | ORAL | 0 refills | Status: DC

## 2019-11-28 NOTE — Progress Notes (Signed)
   Subjective:    Patient ID: Paula Cooley, female    DOB: [birth year], 28 y.o.   MRN: [id number]  HPI I connected with  Paula Cooley on 11/28/19 by a video enabled telemedicine application and verified that I am speaking with the correct person using two identifiers.  Caregility used I am in my office and she is in her office I discussed the limitations of evaluation and management by telemedicine. The patient expressed understanding and agreed to proceed. She has a history of ADD but stopped taking her medication because she ran out but did not quickly realized that she needs it so she can stay focused.  The medicine lasts roughly 5 or 6 hours and when it wears off she loses her focus and cannot seem to get any of the tasks done.  The medicine is causing no trouble with her eating and she is having no withdrawal symptoms.    Review of Systems     Objective:   Physical Exam Alert and in no distress otherwise not examined       Assessment & Plan:  Attention deficit hyperactivity disorder (ADHD), predominantly hyperactive type - Plan: amphetamine-dextroamphetamine (ADDERALL) 20 MG tablet, amphetamine-dextroamphetamine (ADDERALL) 20 MG tablet, amphetamine-dextroamphetamine (ADDERALL) 20 MG tablet I will continue her on her present medication regimen.  She seems to be doing quite nicely on it.

## 2019-12-19 ENCOUNTER — Other Ambulatory Visit (HOSPITAL_COMMUNITY): Payer: Self-pay | Admitting: Surgery

## 2019-12-19 ENCOUNTER — Other Ambulatory Visit: Payer: Self-pay

## 2019-12-19 ENCOUNTER — Other Ambulatory Visit: Payer: Self-pay | Admitting: Surgery

## 2019-12-19 ENCOUNTER — Ambulatory Visit
Admission: RE | Admit: 2019-12-19 | Discharge: 2019-12-19 | Disposition: A | Discharge status: Home / Self Care | Payer: Self-pay | Source: Ambulatory Visit | Attending: Surgery | Admitting: Surgery

## 2019-12-19 ENCOUNTER — Other Ambulatory Visit
Admission: RE | Admit: 2019-12-19 | Discharge: 2019-12-19 | Disposition: A | Discharge status: Home / Self Care | Payer: Self-pay | Source: Home / Self Care | Attending: Surgery | Admitting: Surgery

## 2019-12-19 DIAGNOSIS — R1033 Periumbilical pain: Secondary | ICD-10-CM | POA: Insufficient documentation

## 2019-12-19 LAB — PREGNANCY, URINE: Preg Test, Ur: NEGATIVE

## 2019-12-19 MED ORDER — IOHEXOL 300 MG/ML  SOLN
100.0000 mL | Freq: Once | INTRAMUSCULAR | Status: AC | PRN
  Administered 2019-12-19: 100 mL via INTRAVENOUS

## 2020-01-03 ENCOUNTER — Ambulatory Visit: Payer: Self-pay

## 2020-04-16 ENCOUNTER — Telehealth: Payer: Self-pay | Admitting: Family Medicine

## 2020-04-16 ENCOUNTER — Other Ambulatory Visit: Payer: Self-pay | Admitting: Medical

## 2020-04-16 DIAGNOSIS — F901 Attention-deficit hyperactivity disorder, predominantly hyperactive type: Secondary | ICD-10-CM

## 2020-04-16 MED ORDER — AMPHETAMINE-DEXTROAMPHETAMINE 20 MG PO TABS: 20.0000 mg | ORAL_TABLET | Freq: Two times a day (BID) | ORAL | 0 refills | Status: DC

## 2020-04-16 NOTE — Telephone Encounter (Signed)
Financial reasons

## 2020-04-16 NOTE — Telephone Encounter (Signed)
Done, refill sent

## 2020-04-16 NOTE — Telephone Encounter (Signed)
Pt called and is requesting a refill on her adderall please send to the [facility] on [address]  Pt was dismissed on 04/02/20 we are still in the 30 days  Sending to you as dr Redmond School in the office

## 2020-04-16 NOTE — Telephone Encounter (Signed)
I cant tell why the dismissal.  If financial then I'll send.  If dismissed due to not following up with Dr. Redmond School or violation of controlled substances use/mis use then no refill.  Was it a financial dismissal?

## 2020-04-28 ENCOUNTER — Inpatient Hospital Stay
Admission: EM | Admit: 2020-04-28 | Discharge: 2020-05-03 | DRG: 390 | Disposition: A | Discharge status: Home / Self Care | Payer: Self-pay | Attending: Surgery | Admitting: Surgery

## 2020-04-28 ENCOUNTER — Other Ambulatory Visit: Payer: Self-pay

## 2020-04-28 ENCOUNTER — Emergency Department: Payer: Self-pay

## 2020-04-28 ENCOUNTER — Encounter: Payer: Self-pay | Admitting: Radiology

## 2020-04-28 DIAGNOSIS — Z20822 Contact with and (suspected) exposure to covid-19: Secondary | ICD-10-CM | POA: Diagnosis present

## 2020-04-28 DIAGNOSIS — Z7289 Other problems related to lifestyle: Secondary | ICD-10-CM

## 2020-04-28 DIAGNOSIS — Z9049 Acquired absence of other specified parts of digestive tract: Secondary | ICD-10-CM

## 2020-04-28 DIAGNOSIS — K565 Intestinal adhesions [bands], unspecified as to partial versus complete obstruction: Principal | ICD-10-CM | POA: Diagnosis present

## 2020-04-28 DIAGNOSIS — Z801 Family history of malignant neoplasm of trachea, bronchus and lung: Secondary | ICD-10-CM

## 2020-04-28 DIAGNOSIS — R066 Hiccough: Secondary | ICD-10-CM | POA: Diagnosis not present

## 2020-04-28 DIAGNOSIS — Q430 Meckel's diverticulum (displaced) (hypertrophic): Secondary | ICD-10-CM

## 2020-04-28 DIAGNOSIS — Z87891 Personal history of nicotine dependence: Secondary | ICD-10-CM

## 2020-04-28 DIAGNOSIS — K56609 Unspecified intestinal obstruction, unspecified as to partial versus complete obstruction: Secondary | ICD-10-CM | POA: Diagnosis present

## 2020-04-28 DIAGNOSIS — Z833 Family history of diabetes mellitus: Secondary | ICD-10-CM

## 2020-04-28 DIAGNOSIS — R1030 Lower abdominal pain, unspecified: Secondary | ICD-10-CM

## 2020-04-28 DIAGNOSIS — Z8249 Family history of ischemic heart disease and other diseases of the circulatory system: Secondary | ICD-10-CM

## 2020-04-28 DIAGNOSIS — J45909 Unspecified asthma, uncomplicated: Secondary | ICD-10-CM | POA: Diagnosis present

## 2020-04-28 DIAGNOSIS — R109 Unspecified abdominal pain: Secondary | ICD-10-CM | POA: Diagnosis present

## 2020-04-28 LAB — CBC
HCT: 40.1 % (ref 36.0–46.0)
Hemoglobin: 14.2 g/dL (ref 12.0–15.0)
MCH: 30.7 pg (ref 26.0–34.0)
MCHC: 35.4 g/dL (ref 30.0–36.0)
MCV: 86.8 fL (ref 80.0–100.0)
Platelets: 244 10*3/uL (ref 150–400)
RBC: 4.62 MIL/uL (ref 3.87–5.11)
RDW: 12 % (ref 11.5–15.5)
WBC: 12.3 10*3/uL — ABNORMAL HIGH (ref 4.0–10.5)
nRBC: 0 % (ref 0.0–0.2)

## 2020-04-28 LAB — SARS CORONAVIRUS 2 (TAT 6-24 HRS): SARS Coronavirus 2: NEGATIVE

## 2020-04-28 LAB — HIV ANTIBODY (ROUTINE TESTING W REFLEX): HIV Screen 4th Generation wRfx: NONREACTIVE

## 2020-04-28 LAB — COMPREHENSIVE METABOLIC PANEL
ALT: 14 U/L (ref 0–44)
AST: 16 U/L (ref 15–41)
Albumin: 4.3 g/dL (ref 3.5–5.0)
Alkaline Phosphatase: 52 U/L (ref 38–126)
Anion gap: 9 (ref 5–15)
BUN: 11 mg/dL (ref 6–20)
CO2: 23 mmol/L (ref 22–32)
Calcium: 9.5 mg/dL (ref 8.9–10.3)
Chloride: 105 mmol/L (ref 98–111)
Creatinine, Ser: 0.72 mg/dL (ref 0.44–1.00)
GFR, Estimated: >60 mL/min (ref >60)
Glucose, Bld: 114 mg/dL — ABNORMAL HIGH (ref 70–99)
Potassium: 3.6 mmol/L (ref 3.5–5.1)
Sodium: 137 mmol/L (ref 135–145)
Total Bilirubin: 0.6 mg/dL (ref 0.3–1.2)
Total Protein: 7.6 g/dL (ref 6.5–8.1)

## 2020-04-28 LAB — URINALYSIS, COMPLETE (UACMP) WITH MICROSCOPIC
Bilirubin Urine: NEGATIVE
Glucose, UA: NEGATIVE mg/dL
Hgb urine dipstick: NEGATIVE
Ketones, ur: 5 mg/dL — AB
Leukocytes,Ua: NEGATIVE
Nitrite: NEGATIVE
Protein, ur: NEGATIVE mg/dL
Specific Gravity, Urine: 1.027 (ref 1.005–1.030)
pH: 6 (ref 5.0–8.0)

## 2020-04-28 LAB — LIPASE, BLOOD: Lipase: 33 U/L (ref 11–51)

## 2020-04-28 LAB — POC URINE PREG, ED: Preg Test, Ur: NEGATIVE

## 2020-04-28 MED ORDER — PANTOPRAZOLE SODIUM 40 MG IV SOLR
40.0000 mg | Freq: Two times a day (BID) | INTRAVENOUS | Status: DC
  Administered 2020-04-28 – 2020-05-03 (×11): 40 mg via INTRAVENOUS
  Filled 2020-04-28 (×11): qty 40

## 2020-04-28 MED ORDER — MORPHINE SULFATE (PF) 2 MG/ML IV SOLN
INTRAVENOUS | Status: AC
  Filled 2020-04-28: qty 1

## 2020-04-28 MED ORDER — ONDANSETRON HCL 4 MG/2ML IJ SOLN
4.0000 mg | Freq: Once | INTRAMUSCULAR | Status: AC
  Administered 2020-04-28: 4 mg via INTRAVENOUS
  Filled 2020-04-28: qty 2

## 2020-04-28 MED ORDER — IOHEXOL 300 MG/ML  SOLN
100.0000 mL | Freq: Once | INTRAMUSCULAR | Status: AC | PRN
  Administered 2020-04-28: 100 mL via INTRAVENOUS

## 2020-04-28 MED ORDER — KETOROLAC TROMETHAMINE 30 MG/ML IJ SOLN: 30.0000 mg | Freq: Once | INTRAMUSCULAR | Status: AC

## 2020-04-28 MED ORDER — SODIUM CHLORIDE 0.9 % IV SOLN
25.0000 mg | Freq: Once | INTRAVENOUS | Status: DC
  Filled 2020-04-28 (×4): qty 1

## 2020-04-28 MED ORDER — SODIUM CHLORIDE 0.9 % IV SOLN: INTRAVENOUS | Status: DC

## 2020-04-28 MED ORDER — ONDANSETRON HCL 4 MG/2ML IJ SOLN
4.0000 mg | Freq: Four times a day (QID) | INTRAMUSCULAR | Status: DC | PRN
  Administered 2020-04-28 – 2020-05-02 (×7): 4 mg via INTRAVENOUS
  Filled 2020-04-28 (×8): qty 2

## 2020-04-28 MED ORDER — MORPHINE SULFATE (PF) 4 MG/ML IV SOLN
4.0000 mg | Freq: Once | INTRAVENOUS | Status: AC
  Administered 2020-04-28: 4 mg via INTRAVENOUS
  Filled 2020-04-28: qty 1

## 2020-04-28 MED ORDER — LORAZEPAM 2 MG/ML IJ SOLN
INTRAMUSCULAR | Status: AC
  Administered 2020-04-28: 1 mg
  Filled 2020-04-28: qty 1

## 2020-04-28 MED ORDER — LACTATED RINGERS IV BOLUS
1000.0000 mL | Freq: Once | INTRAVENOUS | Status: AC
  Administered 2020-04-28: 1000 mL via INTRAVENOUS

## 2020-04-28 MED ORDER — HEPARIN SODIUM (PORCINE) 5000 UNIT/ML IJ SOLN
5000.0000 [IU] | Freq: Three times a day (TID) | INTRAMUSCULAR | Status: DC
  Administered 2020-04-28 – 2020-05-03 (×13): 5000 [IU] via SUBCUTANEOUS
  Filled 2020-04-28 (×13): qty 1

## 2020-04-28 MED ORDER — KETOROLAC TROMETHAMINE 30 MG/ML IJ SOLN
INTRAMUSCULAR | Status: AC
  Administered 2020-04-28: 30 mg
  Filled 2020-04-28: qty 2

## 2020-04-28 MED ORDER — ONDANSETRON HCL 4 MG PO TABS: 4.0000 mg | ORAL_TABLET | Freq: Four times a day (QID) | ORAL | Status: DC | PRN

## 2020-04-28 MED ORDER — MORPHINE SULFATE (PF) 2 MG/ML IV SOLN
2.0000 mg | INTRAVENOUS | Status: DC | PRN
  Administered 2020-04-28 – 2020-04-29 (×5): 2 mg via INTRAVENOUS
  Filled 2020-04-28 (×5): qty 1

## 2020-04-28 NOTE — H&P (Signed)
History and Physical    Paula Cooley MVH:[id number] DOB: 08-22-1990 DOA: 04/28/2020  PCP: Denita Lung, MD    Patient coming from:  Home    Chief Complaint:  Abdominal pain.   HPI: Paula Cooley is a 30 y.o. female seen in ed with complaints of abdominal pain.  Abdominal pain started at 1 am this morning. Generalzied abd pain,nausea today am and last bm was about 24 hours ago. Pain is intermittent and note no aggravating or relieving factors.  Pt denies any assoc fever/ chills diarreha, melena or hematemesis.D/W pt about avoding NSAIDS. 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   ED Course:  Vitals:   04/28/20 0631 04/28/20 0821 04/28/20 0830 04/28/20 1019  BP:  116/77 104/64 118/67  Pulse:  74 72 73  Resp:  18  18  Temp:      TempSrc:      SpO2:  99% 98% 98%  Weight: 77.1 kg     Height: 5' 6"  (1.676 m)     In Ed pt is A/O x 3 and labs today shows normal CMP and CBC.Covid is pending.UPT is negative. CT abd/pelvis shows: Findings of SBO.  Review of Systems:  Review of Systems  All other systems reviewed and are negative.    Past Medical History:  Diagnosis Date  . Acetabular labrum tear    R shoulder   . Acne   . Asthma    exercise induced, no problems in quite a while, last use of inhaler- >one year  . Hypoglycemia    uses glucose tablets PRN  . Seasonal allergies     Past Surgical History:  Procedure Laterality Date  . LAPAROSCOPY N/A 12/31/2018   Procedure: LAPAROSCOPY DIAGNOSTIC;  Surgeon: Benjamine Sprague, DO;  Location: [facility];  Service: General;  Laterality: N/A;  . LAPAROTOMY N/A 12/31/2018   Procedure: EXPLORATORY LAPAROTOMY WITH REDUCTION OF HERNIA;  Surgeon: Benjamine Sprague, DO;  Location: [facility];  Service: General;  Laterality: N/A;  . ROTATOR CUFF REPAIR Right 07/2011   as also bicep tendon surgery  . SHOULDER ARTHROSCOPY WITH LABRAL REPAIR Right 01/01/2016   Procedure:  SHOULDER ARTHROSCOPY WITH LABRAL (SLAP) REPAIR;  Surgeon: Meredith Pel, MD;  Location: [place];  Service: Orthopedics;  Laterality: Right;  . WISDOM TOOTH EXTRACTION    . XI ROBOT ASSISTED DIAGNOSTIC LAPAROSCOPY N/A 12/28/2018   Procedure: XI ROBOT ASSISTED DIAGNOSTIC LAPAROSCOPY;  Surgeon: Benjamine Sprague, DO;  Location: [facility];  Service: General;  Laterality: N/A;     reports that she quit smoking about 5 years ago. She has never used smokeless tobacco. She reports current alcohol use. She reports that she does not use drugs.  Allergies  Allergen Reactions  . No Known Allergies     Family History  Problem Relation Age of Onset  . Hypertension Mother   . Heart disease Father 60       MI; s/p CABG  . Diabetes Father        resolved after weight loss  . Cancer Paternal Grandmother        lung    Prior to Admission medications   Medication Sig Start Date End Date Taking? Authorizing Provider  amphetamine-dextroamphetamine (ADDERALL) 20 MG tablet Take 1 tablet (20 mg total) by mouth 2 (two) times daily. 12/29/19   Denita Lung, MD  amphetamine-dextroamphetamine (ADDERALL) 20 MG tablet Take 1 tablet (20 mg total) by mouth 2 (two)  times daily. 11/28/19   Denita Lung, MD  amphetamine-dextroamphetamine (ADDERALL) 20 MG tablet Take 1 tablet (20 mg total) by mouth 2 (two) times daily. 04/16/20   Tysinger, Camelia Eng, PA-C  HYDROcodone-acetaminophen (NORCO) 5-325 MG tablet Take 1 tablet by mouth every 6 (six) hours as needed for up to 6 doses for moderate pain. Patient not taking: Reported on 11/28/2019 01/05/19   Benjamine Sprague, DO  ibuprofen (ADVIL) 800 MG tablet Take 1 tablet (800 mg total) by mouth every 8 (eight) hours as needed for mild pain or moderate pain. 01/05/19   Sakai, Isami, DO  ZOVIA 1/35E, 28, 1-35 MG-MCG tablet Take 1 tablet by mouth daily. 11/09/18   [provider]    Physical Exam: Vitals:   04/28/20 0631 04/28/20 0821 04/28/20 0830 04/28/20 1019  BP:   116/77 104/64 118/67  Pulse:  74 72 73  Resp:  18  18  Temp:      TempSrc:      SpO2:  99% 98% 98%  Weight: 77.1 kg     Height: 5' 6"  (1.676 m)      Physical Exam Vitals and nursing note reviewed.  Constitutional:      General: She is not in acute distress.    Appearance: Normal appearance. She is not ill-appearing.  HENT:     Head: Normocephalic and atraumatic.     Right Ear: External ear normal.     Left Ear: External ear normal.     Mouth/Throat:     Mouth: Mucous membranes are dry.  Eyes:     Extraocular Movements: Extraocular movements intact.     Pupils: Pupils are equal, round, and reactive to light.  Cardiovascular:     Rate and Rhythm: Normal rate and regular rhythm.     Pulses: Normal pulses.     Heart sounds: Normal heart sounds.  Pulmonary:     Effort: Pulmonary effort is normal.     Breath sounds: Normal breath sounds.  Abdominal:     General: Bowel sounds are absent. There is no distension.     Palpations: Abdomen is soft.     Tenderness: There is abdominal tenderness. There is no guarding.    Musculoskeletal:     Right lower leg: No edema.     Left lower leg: No edema.  Neurological:     General: No focal deficit present.     Mental Status: She is alert and oriented to person, place, and time.  Psychiatric:        Mood and Affect: Mood normal.        Behavior: Behavior normal.      Labs on Admission: I have personally reviewed following labs and imaging studies  No results for input(s): CKTOTAL, CKMB, TROPONINI in the last 72 hours. Lab Results  Component Value Date   WBC 12.3 (H) 04/28/2020   HGB 14.2 04/28/2020   HCT 40.1 04/28/2020   MCV 86.8 04/28/2020   PLT 244 04/28/2020    Recent Labs  Lab 04/28/20 0636  NA 137  K 3.6  CL 105  CO2 23  BUN 11  CREATININE 0.72  CALCIUM 9.5  PROT 7.6  BILITOT 0.6  ALKPHOS 52  ALT 14  AST 16  GLUCOSE 114*   No results found for: CHOL, HDL, LDLCALC, TRIG No results found for:  DDIMER Invalid input(s): POCBNP  Urinalysis    Component Value Date/Time   COLORURINE YELLOW (A) 04/28/2020 [place] (A) 04/28/2020 3825  LABSPEC 1.027 04/28/2020 0836   PHURINE 6.0 04/28/2020 0836   GLUCOSEU NEGATIVE 04/28/2020 0836   HGBUR NEGATIVE 04/28/2020 [place] 04/28/2020 0836   BILIRUBINUR neg 09/06/2012 1355   KETONESUR 5 (A) 04/28/2020 0836   PROTEINUR NEGATIVE 04/28/2020 0836   UROBILINOGEN negative 09/06/2012 1355   NITRITE NEGATIVE 04/28/2020 0836   LEUKOCYTESUR NEGATIVE 04/28/2020 0836    COVID-19 Labs Pending.  Lab Results  Component Value Date   [place] NEGATIVE 12/28/2018    Radiological Exams on Admission: CT Abdomen Pelvis W Contrast  Result Date: 04/28/2020 CLINICAL DATA:  Lower mid abdomen pain starting this morning. Assess for bowel obstruction. History of prior surgery for diverticulitis in 2020. EXAM: CT ABDOMEN AND PELVIS WITH CONTRAST TECHNIQUE: Multidetector CT imaging of the abdomen and pelvis was performed using the standard protocol following bolus administration of intravenous contrast. CONTRAST:  185m OMNIPAQUE IOHEXOL 300 MG/ML  SOLN COMPARISON:  December 19, 2019 FINDINGS: Lower chest: No acute abnormality. Hepatobiliary: No focal liver abnormality is seen. No gallstones, gallbladder wall thickening, or biliary dilatation. Pancreas: Unremarkable. No pancreatic ductal dilatation or surrounding inflammatory changes. Spleen: Normal in size without focal abnormality. Adrenals/Urinary Tract: The bilateral adrenal glands are normal. Left kidney cysts are identified. The right kidney is normal. There is no hydronephrosis bilaterally. Bladder is normal. Stomach/Bowel: Multiple dilated small bowel loops identified in the mid and lower abdomen with transition around the prior surgical bed. Postsurgical changes are identified in the bowel loops of the anterior upper pelvis. The colon is normal in caliber. The appendix  is not seen but no inflammation is noted around cecum. Vascular/Lymphatic: No significant vascular findings are present. No enlarged abdominal or pelvic lymph nodes. Reproductive: Uterus and bilateral adnexa are unremarkable. Minimal free fluid is identified in the pelvis, likely physiologic. Tampon is identified in the cervix. Other: No abdominal wall hernia or abnormality. No abdominopelvic ascites. Musculoskeletal: No acute or significant osseous findings. IMPRESSION: Multiple dilated small bowel loops identified in the mid and lower abdomen with transition around the prior surgical bed. Findings are consistent with small bowel obstruction. Electronically Signed   By: WAbelardo DieselM.D.   On: 04/28/2020 09:43    EKG: Independently reviewed.  None   Assessment/Plan Principal Problem:   Abdominal pain Active Problems:   SBO (small bowel obstruction) (HCC)   Abdominal pain: -Admnit to med surg bed. -PRN zofran and morphine.  -attribute to her SBO.  SBO: -NPO. -ivf/ iv ppi. -NGT held as pt is not vomiting.   DVT prophylaxis:  Heparin  Code Status:  Full Code   Family Communication:  JTheo, Reither(Mother)  [phone](Mobile)  Disposition Plan:  Home  Consults called:  None  Admission status: Inpatient.    EPara SkeansMD [facility] [phone] to contact the TLifestream Behavioral CenterAttending or Consulting provider [place] covering provider during after hours [place] for this patient.    1. Check the care team in [facility]and look for a) attending/consulting TCaliforniaprovider listed and b) the TAl[facility] Medical Centerteam listed 2. Log into [email or web address] and use [place] universal password to access. If you do not have the password, please contact the hospital operator. 3. Locate the TOneida Healthcareprovider you are looking for under [facility] and page to a number that you can be directly reached. 4. If you still have difficulty reaching the provider, please page the [facility](Director on Call) for  the Hospitalists listed on amion for assistance. [email or web address] Password TE[facility] Center3/19/2022,  10:49 AM

## 2020-04-28 NOTE — ED Provider Notes (Signed)
[facility] Emergency Department Provider Note   ____________________________________________   Event Date/Time   First MD Initiated Contact with Patient 04/28/20 [id number]     (approximate)  I have reviewed the triage vital signs and the nursing notes.   HISTORY  Chief Complaint Abdominal Pain    HPI Paula Cooley is a 30 y.o. 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  Patient reports that she had acute onset of central abdominal pain around 1:00 this morning.  She has been feeling nauseous with 2 episodes of vomiting, denies diarrhea and her last bowel movement was about 24 hours ago.  Pain has been coming in waves but is not exacerbated or alleviated by anything in particular.  She denies any associated dysuria, hematuria, vaginal bleeding, or vaginal discharge.  She states current symptoms are similar to what she has dealt with in the past when she was diagnosed with the perforated Meckel's diverticulum, had 1 episode of similar pain since then attributed to adhesions.        Past Medical History:  Diagnosis Date  . Acetabular labrum tear    R shoulder   . Acne   . Asthma    exercise induced, no problems in quite a while, last use of inhaler- >one year  . Hypoglycemia    uses glucose tablets PRN  . Seasonal allergies     Patient Active Problem List   Diagnosis Date Noted  . Perforated Meckel's diverticulum   . Intractable pain   . Intractable vomiting   . Abdominal pain 12/28/2018  . Labral tear of shoulder, degenerative, right 04/02/2016  . Chronic asthma 07/13/2013  . Exercise-induced asthma 07/13/2013  . Allergic rhinitis, cause unspecified 06/30/2011    Past Surgical History:  Procedure Laterality Date  . LAPAROSCOPY N/A 12/31/2018   Procedure: LAPAROSCOPY DIAGNOSTIC;  Surgeon: Benjamine Sprague, DO;  Location: [facility];   Service: General;  Laterality: N/A;  . LAPAROTOMY N/A 12/31/2018   Procedure: EXPLORATORY LAPAROTOMY WITH REDUCTION OF HERNIA;  Surgeon: Benjamine Sprague, DO;  Location: [facility];  Service: General;  Laterality: N/A;  . ROTATOR CUFF REPAIR Right 07/2011   as also bicep tendon surgery  . SHOULDER ARTHROSCOPY WITH LABRAL REPAIR Right 01/01/2016   Procedure: SHOULDER ARTHROSCOPY WITH LABRAL (SLAP) REPAIR;  Surgeon: Meredith Pel, MD;  Location: [place];  Service: Orthopedics;  Laterality: Right;  . WISDOM TOOTH EXTRACTION    . XI ROBOT ASSISTED DIAGNOSTIC LAPAROSCOPY N/A 12/28/2018   Procedure: XI ROBOT ASSISTED DIAGNOSTIC LAPAROSCOPY;  Surgeon: Benjamine Sprague, DO;  Location: [facility];  Service: General;  Laterality: N/A;    Prior to Admission medications   Medication Sig Start Date End Date Taking? Authorizing Provider  amphetamine-dextroamphetamine (ADDERALL) 20 MG tablet Take 1 tablet (20 mg total) by mouth 2 (two) times daily. 12/29/19   Denita Lung, MD  amphetamine-dextroamphetamine (ADDERALL) 20 MG tablet Take 1 tablet (20 mg total) by mouth 2 (two) times daily. 11/28/19   Denita Lung, MD  amphetamine-dextroamphetamine (ADDERALL) 20 MG tablet Take 1 tablet (20 mg total) by mouth 2 (two) times daily. 04/16/20   Tysinger, Camelia Eng, PA-C  HYDROcodone-acetaminophen (NORCO) 5-325 MG tablet Take 1 tablet by mouth every 6 (six) hours as needed for up to 6 doses for moderate pain. Patient not taking: Reported on 11/28/2019 01/05/19   Benjamine Sprague, DO  ibuprofen (ADVIL) 800 MG tablet  Take 1 tablet (800 mg total) by mouth every 8 (eight) hours as needed for mild pain or moderate pain. 01/05/19   Sakai, Isami, DO  ZOVIA 1/35E, 28, 1-35 MG-MCG tablet Take 1 tablet by mouth daily. 11/09/18   [provider]    Allergies No known allergies  Family History  Problem Relation Age of Onset  . Hypertension Mother   . Heart disease Father 25       MI; s/p CABG  . Diabetes Father         resolved after weight loss  . Cancer Paternal Grandmother        lung    Social History Social History   Tobacco Use  . Smoking status: Former Smoker    Quit date: 12/26/2014    Years since quitting: 5.3  . Smokeless tobacco: Never Used  Substance Use Topics  . Alcohol use: Yes    Alcohol/week: 0.0 standard drinks    Comment: 3-5 drinks every other week  . Drug use: No    Review of Systems  Constitutional: No fever/chills Eyes: No visual changes. ENT: No sore throat. Cardiovascular: Denies chest pain. Respiratory: Denies shortness of breath. Gastrointestinal: Positive for abdominal pain, nausea, and vomiting.  No diarrhea.  No constipation. Genitourinary: Negative for dysuria. Musculoskeletal: Negative for back pain. Skin: Negative for rash. Neurological: Negative for headaches, focal weakness or numbness.  ____________________________________________   PHYSICAL EXAM:  VITAL SIGNS: ED Triage Vitals  Enc Vitals Group     BP 04/28/20 0630 122/78     Pulse Rate 04/28/20 0630 72     Resp 04/28/20 0630 16     Temp 04/28/20 0630 98.5 F (36.9 C)     Temp Source 04/28/20 0630 Oral     SpO2 04/28/20 0630 100 %     Weight 04/28/20 0631 170 lb (77.1 kg)     Height 04/28/20 0631 5' 6"  (1.676 m)     Head Circumference --      Peak Flow --      Pain Score 04/28/20 0631 8     Pain Loc --      Pain Edu? --      Excl. in [place]? --     Constitutional: Alert and oriented. Eyes: Conjunctivae are normal. Head: Atraumatic. Nose: No congestion/rhinnorhea. Mouth/Throat: Mucous membranes are moist. Neck: Normal ROM Cardiovascular: Normal rate, regular rhythm. Grossly normal heart sounds. Respiratory: Normal respiratory effort.  No retractions. Lungs CTAB. Gastrointestinal: Soft and diffusely tender to palpation with voluntary guarding. No distention. Genitourinary: deferred Musculoskeletal: No lower extremity tenderness nor edema. Neurologic:  Normal speech and language. No  gross focal neurologic deficits are appreciated. Skin:  Skin is warm, dry and intact. No rash noted. Psychiatric: Mood and affect are normal. Speech and behavior are normal.  ____________________________________________   LABS (all labs ordered are listed, but only abnormal results are displayed)  Labs Reviewed  COMPREHENSIVE METABOLIC PANEL - Abnormal; Notable for the following components:      Result Value   Glucose, Bld 114 (*)    All other components within normal limits  CBC - Abnormal; Notable for the following components:   WBC 12.3 (*)    All other components within normal limits  URINALYSIS, COMPLETE (UACMP) WITH MICROSCOPIC - Abnormal; Notable for the following components:   Color, Urine YELLOW (*)    APPearance CLEAR (*)    Ketones, ur 5 (*)    Bacteria, UA RARE (*)    All other components within  normal limits  SARS CORONAVIRUS 2 (TAT 6-24 HRS)  LIPASE, BLOOD  POC URINE PREG, ED    PROCEDURES  Procedure(s) performed (including Critical Care):  Procedures   ____________________________________________   INITIAL IMPRESSION / ASSESSMENT AND PLAN / ED COURSE       30 year old female with past medical history of asthma and 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  We will further assess for bowel obstruction, perforation, appendicitis, or other acute process with CT scan.  We will treat with IV morphine and Zofran.  Labs thus far remarkable for mild leukocytosis, CMP is unremarkable.  Pregnancy testing and UA are pending, but patient denies any urinary symptoms.  Pregnancy testing is negative and UA shows no signs of infection.  CT scan shows dilated loops of bowel consistent with a small bowel obstruction, patient now having worsening pain and we will redose morphine.  Plan to discuss with hospitalist for admission.       ____________________________________________   FINAL CLINICAL IMPRESSION(S) / ED DIAGNOSES  Final diagnoses:  SBO (small bowel obstruction) [facility])     ED Discharge Orders    None       Note:  This document was prepared using Dragon voice recognition software and may include unintentional dictation errors.   Blake Divine, MD 04/28/20 1017

## 2020-04-28 NOTE — Progress Notes (Signed)
Upon shift change, patient began screaming out in pain and complaining of SOB. Rapid response called and NP notified of change in patient's status. VS taken and new orders placed by NP. Two unsuccessful attempts to place NG tube. Once by ICU nurse and second by NP. 1 mg of Lorazepam given in the room with NP prior to second attempt. Patient is currently resting in the bed with no complaints. Her pain has greatly improved. Will continue to monitor.

## 2020-04-28 NOTE — ED Triage Notes (Signed)
Pt states lower mid abd pain that began at 0100 this am with emesis x2. Pt does not appear to be in distress, denies dysuria, fever or diarrhea. Pt states she has less pain is knees are drawn up.

## 2020-04-29 ENCOUNTER — Inpatient Hospital Stay: Payer: Self-pay

## 2020-04-29 DIAGNOSIS — R1115 Cyclical vomiting syndrome unrelated to migraine: Secondary | ICD-10-CM

## 2020-04-29 DIAGNOSIS — R109 Unspecified abdominal pain: Secondary | ICD-10-CM

## 2020-04-29 LAB — GLUCOSE, CAPILLARY: Glucose-Capillary: 97 mg/dL (ref 70–99)

## 2020-04-29 MED ORDER — KETOROLAC TROMETHAMINE 30 MG/ML IJ SOLN
30.0000 mg | Freq: Four times a day (QID) | INTRAMUSCULAR | Status: DC | PRN
  Administered 2020-04-29: 30 mg via INTRAVENOUS
  Filled 2020-04-29: qty 1

## 2020-04-29 MED ORDER — ALPRAZOLAM 0.5 MG PO TABS
1.0000 mg | ORAL_TABLET | Freq: Two times a day (BID) | ORAL | Status: DC | PRN
  Administered 2020-04-29: 1 mg via ORAL
  Filled 2020-04-29: qty 2

## 2020-04-29 MED ORDER — MORPHINE SULFATE (PF) 2 MG/ML IV SOLN
2.0000 mg | INTRAVENOUS | Status: DC | PRN
  Administered 2020-04-29 – 2020-05-03 (×30): 2 mg via INTRAVENOUS
  Filled 2020-04-29 (×30): qty 1

## 2020-04-29 MED ORDER — SODIUM CHLORIDE 0.9 % IV SOLN
25.0000 mg | Freq: Four times a day (QID) | INTRAVENOUS | Status: DC | PRN
  Filled 2020-04-29 (×2): qty 1

## 2020-04-29 MED ORDER — LACTATED RINGERS IV SOLN: INTRAVENOUS | Status: DC

## 2020-04-29 MED ORDER — SODIUM CHLORIDE 0.9 % IV SOLN
25.0000 mg | Freq: Four times a day (QID) | INTRAVENOUS | Status: DC | PRN
  Administered 2020-04-29 (×2): 25 mg via INTRAVENOUS
  Filled 2020-04-29 (×2): qty 1

## 2020-04-29 NOTE — Hospital Course (Addendum)
Paula Cooley is a 30 y.o. 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 Lysle Pearl, seasonal allergies and asthma who presented to the ED on 04/28/2020 with worsening generalized abdominal pain that started at 1 AM that morning.  She was found to have small bowel obstruction on CT abdomen/pelvis obtained in the ED.

## 2020-04-29 NOTE — Progress Notes (Signed)
PROGRESS NOTE    Paula Cooley   YPP:[id number]  DOB: 11-26-1990  PCP: Denita Lung, MD    DOA: 04/28/2020 LOS: 1   Brief Narrative   Paula Cooley is a 30 y.o. 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 Lysle Pearl, seasonal allergies and asthma who presented to the ED on 04/28/2020 with worsening generalized abdominal pain that started at 1 AM that morning.  She was found to have small bowel obstruction on CT abdomen/pelvis obtained in the ED.     Assessment & Plan   Principal Problem:   Abdominal pain Active Problems:   SBO (small bowel obstruction) (HCC)   SBO - with surgical hx as outlined above.   --General surgery consulted, appreciate assistance --NG tube to low intermittent suction --NPO --Pain and nausea control PRN --IV fluids     DVT prophylaxis: heparin injection 5,000 Units Start: 04/28/20 1400 SCDs Start: 04/28/20 1114   Diet:  Diet Orders (From admission, onward)    Start     Ordered   04/28/20 1115  Diet NPO time specified  Diet effective now        04/28/20 1115            Code Status: Full Code    Subjective 04/29/20    Pt in excruciating pain and nauseated when seen this AM.  Mother at bedside.  Pt had just had large volume bilious vomit a few moments earlier.  No other complaints currently.   Disposition Plan & Communication   Status is: Inpatient  Inpatient status appropriate due to severity of illness as above.  Dispo: The patient is from: home              Anticipated d/c is to: home              Patient currently not stable for d/c.   Difficult to place patient no   Family Communication: Mother at bedside on rounds   Consults, Procedures, Significant Events   Consultants:   General surgery  Procedures:   NG tube placement 3/20  Antimicrobials:  Anti-infectives (From  admission, onward)   None        Micro    Objective   Vitals:   04/29/20 0017 04/29/20 0412 04/29/20 1100 04/29/20 1201  BP:  122/81  113/82  Pulse:  68  70  Resp: 12 15  18   Temp:  98.2 F (36.8 C) 98.2 F (36.8 C)   TempSrc:  Oral Oral   SpO2: 98% 98%  98%  Weight:      Height:        Intake/Output Summary (Last 24 hours) at 04/29/2020 1455 Last data filed at 04/29/2020 1253 Gross per 24 hour  Intake 2598.55 ml  Output 250 ml  Net 2348.55 ml   Filed Weights   04/28/20 0631  Weight: 77.1 kg    Physical Exam:    General exam: awake, alert, in marked distress due to pain, writhing Respiratory system: CTAB, no wheezes, rales or rhonchi, normal respiratory effort. Cardiovascular system: normal S1/S2, RRR Gastrointestinal system: soft, diffusely tender on light palpation (limited exam due to pain/distress) Central nervous system: no gross focal neurologic deficits, normal speech Extremities: moves all, no edema, normal tone Skin: dry, intact, normal temperature Psychiatry: anxious mood, congruent affect  Labs   Data Reviewed: I have personally reviewed following  labs and imaging studies  CBC: Recent Labs  Lab 04/28/20 0636  WBC 12.3*  HGB 14.2  HCT 40.1  MCV 86.8  PLT 888   Basic Metabolic Panel: Recent Labs  Lab 04/28/20 0636  NA 137  K 3.6  CL 105  CO2 23  GLUCOSE 114*  BUN 11  CREATININE 0.72  CALCIUM 9.5   GFR: Estimated Creatinine Clearance: 109.7 mL/min (by C-G formula based on SCr of 0.72 mg/dL). Liver Function Tests: Recent Labs  Lab 04/28/20 0636  AST 16  ALT 14  ALKPHOS 52  BILITOT 0.6  PROT 7.6  ALBUMIN 4.3   Recent Labs  Lab 04/28/20 0636  LIPASE 33   No results for input(s): AMMONIA in the last 168 hours. Coagulation Profile: No results for input(s): INR, PROTIME in the last 168 hours. Cardiac Enzymes: No results for input(s): CKTOTAL, CKMB, CKMBINDEX, TROPONINI in the last 168 hours. BNP (last 3 results) No  results for input(s): PROBNP in the last 8760 hours. HbA1C: No results for input(s): HGBA1C in the last 72 hours. CBG: No results for input(s): GLUCAP in the last 168 hours. Lipid Profile: No results for input(s): CHOL, HDL, LDLCALC, TRIG, CHOLHDL, LDLDIRECT in the last 72 hours. Thyroid Function Tests: No results for input(s): TSH, T4TOTAL, FREET4, T3FREE, THYROIDAB in the last 72 hours. Anemia Panel: No results for input(s): VITAMINB12, FOLATE, FERRITIN, TIBC, IRON, RETICCTPCT in the last 72 hours. Sepsis Labs: No results for input(s): PROCALCITON, LATICACIDVEN in the last 168 hours.  Recent Results (from the past 240 hour(s))  SARS CORONAVIRUS 2 (TAT 6-24 HRS) Nasopharyngeal Nasopharyngeal Swab     Status: None   Collection Time: 04/28/20 10:29 AM   Specimen: Nasopharyngeal Swab  Result Value Ref Range Status   SARS Coronavirus 2 NEGATIVE NEGATIVE Final    Comment: (NOTE) SARS-CoV-2 target nucleic acids are NOT DETECTED.  The SARS-CoV-2 RNA is generally detectable in upper and lower respiratory specimens during the acute phase of infection. Negative results do not preclude SARS-CoV-2 infection, do not rule out co-infections with other pathogens, and should not be used as the sole basis for treatment or other patient management decisions. Negative results must be combined with clinical observations, patient history, and epidemiological information. The expected result is Negative.  Fact Sheet for Patients: SugarRoll.be  Fact Sheet for Healthcare Providers: https://www.woods-mathews.com/  This test is not yet approved or cleared by the [place] FDA and  has been authorized for detection and/or diagnosis of SARS-CoV-2 by FDA under an Emergency Use Authorization (EUA). This EUA will remain  in effect (meaning this test can be used) for the duration of the COVID-19 declaration under Se ction 564(b)(1) of the Act, 21 U.S.C. section  360bbb-3(b)(1), unless the authorization is terminated or revoked sooner.  Performed at [facility], [place] [address], [place], [place] [address]       Imaging Studies   CT Abdomen Pelvis W Contrast  Result Date: 04/28/2020 CLINICAL DATA:  Lower mid abdomen pain starting this morning. Assess for bowel obstruction. History of prior surgery for diverticulitis in 2020. EXAM: CT ABDOMEN AND PELVIS WITH CONTRAST TECHNIQUE: Multidetector CT imaging of the abdomen and pelvis was performed using the standard protocol following bolus administration of intravenous contrast. CONTRAST:  175m OMNIPAQUE IOHEXOL 300 MG/ML  SOLN COMPARISON:  December 19, 2019 FINDINGS: Lower chest: No acute abnormality. Hepatobiliary: No focal liver abnormality is seen. No gallstones, gallbladder wall thickening, or biliary dilatation. Pancreas: Unremarkable. No pancreatic ductal dilatation or surrounding  inflammatory changes. Spleen: Normal in size without focal abnormality. Adrenals/Urinary Tract: The bilateral adrenal glands are normal. Left kidney cysts are identified. The right kidney is normal. There is no hydronephrosis bilaterally. Bladder is normal. Stomach/Bowel: Multiple dilated small bowel loops identified in the mid and lower abdomen with transition around the prior surgical bed. Postsurgical changes are identified in the bowel loops of the anterior upper pelvis. The colon is normal in caliber. The appendix is not seen but no inflammation is noted around cecum. Vascular/Lymphatic: No significant vascular findings are present. No enlarged abdominal or pelvic lymph nodes. Reproductive: Uterus and bilateral adnexa are unremarkable. Minimal free fluid is identified in the pelvis, likely physiologic. Tampon is identified in the cervix. Other: No abdominal wall hernia or abnormality. No abdominopelvic ascites. Musculoskeletal: No acute or significant osseous findings. IMPRESSION: Multiple dilated small bowel loops  identified in the mid and lower abdomen with transition around the prior surgical bed. Findings are consistent with small bowel obstruction. Electronically Signed   By: Abelardo Diesel M.D.   On: 04/28/2020 09:43     Medications   Scheduled Meds: . heparin  5,000 Units Subcutaneous Q8H  . pantoprazole (PROTONIX) IV  40 mg Intravenous Q12H   Continuous Infusions: . sodium chloride 100 mL/hr at 04/29/20 1253  . promethazine (PHENERGAN) injection 25 mg (04/29/20 1412)  . promethazine (PHENERGAN) injection         LOS: 1 day    Time spent: 30 minutes    Ezekiel Slocumb, DO [facility]  04/29/2020, 2:55 PM      If 7PM-7AM, please contact night-coverage. How to contact the [facility] Attending or Consulting provider [place] or covering provider during after hours [place], for this patient?    1. Check the care team in [facility] and look for a) attending/consulting [facility] provider listed and b) the [facility] team listed 2. Log into [email or web address] and use [place]'s universal password to access. If you do not have the password, please contact the hospital operator. 3. Locate the [facility] provider you are looking for under [facility] and page to a number that you can be directly reached. 4. If you still have difficulty reaching the provider, please page the [facility] (Director on Call) for the Hospitalists listed on amion for assistance.

## 2020-04-29 NOTE — Consult Note (Signed)
[facility] SURGICAL CONSULTATION NOTE (initial) - cpt: 99254   HISTORY OF PRESENT ILLNESS (HPI):  30 y.o. female presented to [facility] ED yesterday for evaluation of abdominal pain. Patient reports that pain began at 1 AM the evening prior was poorly localized having associated nausea but no vomiting.  She reports the pain is crampy, intermittent with periods of relief.  Nothing has helped more aggravated and.  She denies any fevers or chills.  She has vomited bilious emesis since being in the hospital.  Attempts to place an NG tube yesterday evening were not successful.  She had a CT scan consistent with small bowel obstruction.  She had surgery in November 2020 for perforated Meckel's diverticulum.  Surgery is consulted by hospitalist physician Dr. Arbutus Ped in this context for evaluation and management of small bowel obstruction.  PAST MEDICAL HISTORY (PMH):  Past Medical History:  Diagnosis Date   Acetabular labrum tear    R shoulder    Acne    Asthma    exercise induced, no problems in quite a while, last use of inhaler- >one year   Hypoglycemia    uses glucose tablets PRN   Seasonal allergies      PAST SURGICAL HISTORY ([place]):  Past Surgical History:  Procedure Laterality Date   LAPAROSCOPY N/A 12/31/2018   Procedure: LAPAROSCOPY DIAGNOSTIC;  Surgeon: Benjamine Sprague, DO;  Location: [facility] ORS;  Service: General;  Laterality: N/A;   LAPAROTOMY N/A 12/31/2018   Procedure: EXPLORATORY LAPAROTOMY WITH REDUCTION OF HERNIA;  Surgeon: Benjamine Sprague, DO;  Location: [facility] ORS;  Service: General;  Laterality: N/A;   ROTATOR CUFF REPAIR Right 07/2011   as also bicep tendon surgery   SHOULDER ARTHROSCOPY WITH LABRAL REPAIR Right 01/01/2016   Procedure: SHOULDER ARTHROSCOPY WITH LABRAL (SLAP) REPAIR;  Surgeon: Meredith Pel, MD;  Location: [place];  Service: Orthopedics;  Laterality: Right;   WISDOM TOOTH EXTRACTION     XI ROBOT ASSISTED DIAGNOSTIC LAPAROSCOPY N/A  12/28/2018   Procedure: XI ROBOT ASSISTED DIAGNOSTIC LAPAROSCOPY;  Surgeon: Benjamine Sprague, DO;  Location: [facility] ORS;  Service: General;  Laterality: N/A;     MEDICATIONS:  Prior to Admission medications   Medication Sig Start Date End Date Taking? Authorizing Provider  amphetamine-dextroamphetamine (ADDERALL) 20 MG tablet Take 1 tablet (20 mg total) by mouth 2 (two) times daily. 04/16/20  Yes Tysinger, Camelia Eng, PA-C  Multiple Vitamins-Minerals (MULTIVITAMIN WITH MINERALS) tablet Take 1 tablet by mouth daily.   Yes [provider]     ALLERGIES:  Allergies  Allergen Reactions   No Known Allergies      SOCIAL HISTORY:  Social History   Socioeconomic History   Marital status: Single    Spouse name: Not on file   Number of children: Not on file   Years of education: Not on file   Highest education level: Not on file  Occupational History   Not on file  Tobacco Use   Smoking status: Former Smoker    Quit date: 12/26/2014    Years since quitting: 5.3   Smokeless tobacco: Never Used  Substance and Sexual Activity   Alcohol use: Yes    Alcohol/week: 0.0 standard drinks    Comment: 3-5 drinks every other week   Drug use: No   Sexual activity: Yes    Partners: Male    Birth control/protection: Pill  Other Topics Concern   Not on file  Social History Narrative   Graduated 07/2013 from [employer] in [place].  Hoping  to go to [employer] in the Fall. Just got engaged   Social Determinants of Radio broadcast assistant Strain: Not on file  Food Insecurity: Not on file  Transportation Needs: Not on file  Physical Activity: Not on file  Stress: Not on file  Social Connections: Not on file  Intimate Partner Violence: Not on file     FAMILY HISTORY:  Family History  Problem Relation Age of Onset   Hypertension Mother    Heart disease Father 43       MI; s/p CABG   Diabetes Father        resolved after weight loss   Cancer Paternal Grandmother         lung      REVIEW OF SYSTEMS:  Review of Systems  All other systems reviewed and are negative.   VITAL SIGNS:  Temp:  [98.2 F (36.8 C)-98.8 F (37.1 C)] 98.2 F (36.8 C) (03/20 1641) Pulse Rate:  [68-83] 83 (03/20 1641) Resp:  [12-22] 18 (03/20 1641) BP: (113-130)/(62-93) 130/93 (03/20 1641) SpO2:  [98 %-100 %] 100 % (03/20 1641)     Height: 5' 6"  (167.6 cm) Weight: 77.1 kg BMI (Calculated): 27.45   INTAKE/OUTPUT:  03/19 0701 - 03/20 0700 In: 489.9 [I.V.:489.9] Out: -   PHYSICAL EXAM:  Physical Exam Blood pressure (!) 130/93, pulse 83, temperature 98.2 F (36.8 C), temperature source Oral, resp. rate 18, height 5' 6"  (1.676 m), weight 77.1 kg, last menstrual period 04/26/2020, SpO2 100 %. Last Weight  Most recent update: 04/28/2020  6:31 AM   Weight  77.1 kg (170 lb)            CONSTITUTIONAL: Well developed, and nourished, appropriately responsive and aware without distress.   EYES: Sclera non-icteric.   EARS, NOSE, MOUTH AND THROAT: Oral mucosa is moist.  Hearing is intact to voice.  NECK: Trachea is midline, and there is no jugular venous distension.  LYMPH NODES:  Lymph nodes in the neck are not enlarged. RESPIRATORY:  Lungs are clear, and breath sounds are equal bilaterally. Normal respiratory effort without pathologic use of accessory muscles. CARDIOVASCULAR: Heart is regular in rate and rhythm. GI: The abdomen is soft, nontender, and nondistended. There were no palpable masses. There were hypoactive bowel sounds. MUSCULOSKELETAL:  Symmetrical muscle tone appreciated in all four extremities.    SKIN: Skin turgor is normal. No pathologic skin lesions appreciated.  NEUROLOGIC:  Motor and sensation appear grossly normal.  Cranial nerves are grossly without defect. PSYCH:  Alert and oriented to person, place and time. Affect is appropriate for situation.  Data Reviewed I have personally reviewed what is currently available of the patient's imaging, recent labs  and medical records.    Labs:  CBC Latest Ref Rng & Units 04/28/2020 01/04/2019 01/03/2019  WBC 4.0 - 10.5 K/uL 12.3(H) 7.8 8.2  Hemoglobin 12.0 - 15.0 g/dL 14.2 11.7(L) 10.6(L)  Hematocrit 36.0 - 46.0 % 40.1 35.5(L) 30.6(L)  Platelets 150 - 400 K/uL 244 364 273   CMP Latest Ref Rng & Units 04/28/2020 01/04/2019 01/03/2019  Glucose 70 - 99 mg/dL 114(H) 94 80  BUN 6 - 20 mg/dL 11 8 7   Creatinine 0.44 - 1.00 mg/dL 0.72 0.78 0.63  Sodium 135 - 145 mmol/L 137 141 137  Potassium 3.5 - 5.1 mmol/L 3.6 3.9 3.7  Chloride 98 - 111 mmol/L 105 104 104  CO2 22 - 32 mmol/L 23 28 24   Calcium 8.9 - 10.3 mg/dL 9.5 8.6(L)  8.0(L)  Total Protein 6.5 - 8.1 g/dL 7.6 - -  Total Bilirubin 0.3 - 1.2 mg/dL 0.6 - -  Alkaline Phos 38 - 126 U/L 52 - -  AST 15 - 41 U/L 16 - -  ALT 0 - 44 U/L 14 - -     Imaging studies:  Radiology review:   Last 24 hrs: DG Abd 1 View  Result Date: 04/29/2020 CLINICAL DATA:  Nasogastric tube placement EXAM: ABDOMEN - 1 VIEW COMPARISON:  None. FINDINGS: Nasogastric tube is identified with distal tip in the proximal stomach. IMPRESSION: Nasogastric tube is identified with distal tip in the proximal stomach. Electronically Signed   By: Abelardo Diesel M.D.   On: 04/29/2020 15:11   EXAM: CT ABDOMEN AND PELVIS WITH CONTRAST  TECHNIQUE: Multidetector CT imaging of the abdomen and pelvis was performed using the standard protocol following bolus administration of intravenous contrast.  CONTRAST:  138m OMNIPAQUE IOHEXOL 300 MG/ML  SOLN  COMPARISON:  December 19, 2019  FINDINGS: Lower chest: No acute abnormality.  Hepatobiliary: No focal liver abnormality is seen. No gallstones, gallbladder wall thickening, or biliary dilatation.  Pancreas: Unremarkable. No pancreatic ductal dilatation or surrounding inflammatory changes.  Spleen: Normal in size without focal abnormality.  Adrenals/Urinary Tract: The bilateral adrenal glands are normal. Left kidney cysts are  identified. The right kidney is normal. There is no hydronephrosis bilaterally. Bladder is normal.  Stomach/Bowel: Multiple dilated small bowel loops identified in the mid and lower abdomen with transition around the prior surgical bed. Postsurgical changes are identified in the bowel loops of the anterior upper pelvis. The colon is normal in caliber. The appendix is not seen but no inflammation is noted around cecum.  Vascular/Lymphatic: No significant vascular findings are present. No enlarged abdominal or pelvic lymph nodes.  Reproductive: Uterus and bilateral adnexa are unremarkable. Minimal free fluid is identified in the pelvis, likely physiologic. Tampon is identified in the cervix.  Other: No abdominal wall hernia or abnormality. No abdominopelvic ascites.  Musculoskeletal: No acute or significant osseous findings.  IMPRESSION: Multiple dilated small bowel loops identified in the mid and lower abdomen with transition around the prior surgical bed. Findings are consistent with small bowel obstruction.   Electronically Signed   By: WAbelardo DieselM.D.   On: 04/28/2020 09:43  Assessment/Plan:  30y.o. female with NG tube now having been placed thanks to nurse [employer]     Patient Active Problem List   Diagnosis Date Noted   [place] 04/28/2020   Perforated Meckel's diverticulum    Intractable pain    Intractable vomiting    Abdominal pain 12/28/2018   Labral tear of shoulder, degenerative, right 04/02/2016   Chronic asthma 07/13/2013   Exercise-induced asthma 07/13/2013   Allergic rhinitis, cause unspecified 06/30/2011    -We will continue NG tube to low wall intermittent suction.  -We will continue IV fluid resuscitation/rehydration/replacement for NG tube losses.  -She is clearly hoping to avoid another operation.  And at present time I see no need for emergent intervention.  - DVT prophylaxis.    All of the  above findings and recommendations were discussed with the patient, and all of patient's and present family's questions were answered to their expressed satisfaction.  Thank you for the opportunity to participate in this patient's care.   -- DRonny Bacon M.D., FACS 04/29/2020, 4:54 PM

## 2020-04-29 NOTE — Plan of Care (Signed)
Continuing with plan of care. 

## 2020-04-29 NOTE — Progress Notes (Signed)
Patient had an episode of emesis approximately 169m in volume and bloody nose which as resolved.  Dr. GArbutus Pednotified.  Will continue to monitor patient.

## 2020-04-30 LAB — BASIC METABOLIC PANEL
Anion gap: 12 (ref 5–15)
BUN: 11 mg/dL (ref 6–20)
CO2: 20 mmol/L — ABNORMAL LOW (ref 22–32)
Calcium: 9 mg/dL (ref 8.9–10.3)
Chloride: 107 mmol/L (ref 98–111)
Creatinine, Ser: 0.76 mg/dL (ref 0.44–1.00)
GFR, Estimated: >60 mL/min (ref >60)
Glucose, Bld: 121 mg/dL — ABNORMAL HIGH (ref 70–99)
Potassium: 4.7 mmol/L (ref 3.5–5.1)
Sodium: 139 mmol/L (ref 135–145)

## 2020-04-30 LAB — CBC
HCT: 43.7 % (ref 36.0–46.0)
Hemoglobin: 14.7 g/dL (ref 12.0–15.0)
MCH: 29.9 pg (ref 26.0–34.0)
MCHC: 33.6 g/dL (ref 30.0–36.0)
MCV: 88.8 fL (ref 80.0–100.0)
Platelets: 254 10*3/uL (ref 150–400)
RBC: 4.92 MIL/uL (ref 3.87–5.11)
RDW: 12.3 % (ref 11.5–15.5)
WBC: 10.3 10*3/uL (ref 4.0–10.5)
nRBC: 0 % (ref 0.0–0.2)

## 2020-04-30 LAB — MAGNESIUM: Magnesium: 1.8 mg/dL (ref 1.7–2.4)

## 2020-04-30 MED ORDER — PROCHLORPERAZINE EDISYLATE 10 MG/2ML IJ SOLN
10.0000 mg | Freq: Once | INTRAMUSCULAR | Status: AC
  Administered 2020-04-30: 10 mg via INTRAVENOUS
  Filled 2020-04-30: qty 2

## 2020-04-30 MED ORDER — MENTHOL 3 MG MT LOZG
1.0000 | LOZENGE | OROMUCOSAL | Status: DC | PRN
  Administered 2020-04-30 – 2020-05-01 (×4): 3 mg via ORAL
  Filled 2020-04-30: qty 9

## 2020-04-30 NOTE — Progress Notes (Signed)
PROGRESS NOTE    Paula Cooley   DJT:[id number]  DOB: [birth year]  PCP: Denita Lung, MD    DOA: 04/28/2020 LOS: 2   Brief Narrative   Paula Cooley is a 30 y.o. 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 Lysle Pearl, seasonal allergies and asthma who presented to the ED on 04/28/2020 with worsening generalized abdominal pain that started at 1 AM that morning.  She was found to have small bowel obstruction on CT abdomen/pelvis obtained in the ED.     Assessment & Plan   Principal Problem:   Abdominal pain Active Problems:   SBO (small bowel obstruction) (HCC)   SBO - with surgical hx as outlined above.   --General surgery following, appreciate assistance --NG tube to low intermittent suction - draining copious brown liquid. She vomited twice last night per nursing  --NPO --Pain and nausea control PRN --IV fluids   DVT prophylaxis: heparin injection 5,000 Units Start: 04/28/20 1400 SCDs Start: 04/28/20 1114   Diet:  Diet Orders (From admission, onward)    Start     Ordered   04/30/20 1159  Diet NPO time specified  Diet effective now       Comments: Gum and hard candy still ok   04/30/20 1158            Code Status: Full Code    Subjective 04/30/20    Patient sleepy. Per nursing, she had 2 brown color emesis and was given zofran and phenergan earlier. NGT draining copious brown liquid. Mother at bedside  Disposition Plan & Communication   Status is: Inpatient  Inpatient status appropriate due to severity of illness as above.  Dispo: The patient is from: home              Anticipated d/c is to: home              Patient currently not stable for d/c.   Difficult to place patient no   Family Communication: Mother at bedside on rounds   Consults, Procedures, Significant Events   Consultants:   General  surgery  Procedures:   NG tube placement 3/20  Antimicrobials:  Anti-infectives (From admission, onward)   None        Micro    Objective   Vitals:   04/30/20 1222 04/30/20 1707 04/30/20 1800 04/30/20 1955  BP: 125/79 123/84 122/78 110/74  Pulse: 95 (!) 112 98 89  Resp: 16 18 16 20   Temp: 98.1 F (36.7 C) 98.2 F (36.8 C) 98.3 F (36.8 C) 98.1 F (36.7 C)  TempSrc: Oral Oral  Oral  SpO2: 99% 97% 97% 97%  Weight:      Height:        Intake/Output Summary (Last 24 hours) at 04/30/2020 2137 Last data filed at 04/30/2020 1932 Gross per 24 hour  Intake 2166.71 ml  Output 2350 ml  Net -183.29 ml   Filed Weights   04/28/20 0631  Weight: 77.1 kg    Physical Exam:    General exam: awake, alert, in marked distress due to pain, writhing Respiratory system: CTAB, no wheezes, rales or rhonchi, normal respiratory effort. Cardiovascular system: normal S1/S2, RRR Gastrointestinal system: soft, diffusely tender on light palpation (limited exam due to pain/distress) Central nervous system: no gross focal neurologic deficits, sleepy Extremities: moves all, no edema, normal tone Skin: dry, intact, normal  temperature Psychiatry: difficult to assess as sleepy  Labs   Data Reviewed: I have personally reviewed following labs and imaging studies  CBC: Recent Labs  Lab 04/28/20 0636 04/30/20 0537  WBC 12.3* 10.3  HGB 14.2 14.7  HCT 40.1 43.7  MCV 86.8 88.8  PLT 244 027   Basic Metabolic Panel: Recent Labs  Lab 04/28/20 0636 04/30/20 0537  NA 137 139  K 3.6 4.7  CL 105 107  CO2 23 20*  GLUCOSE 114* 121*  BUN 11 11  CREATININE 0.72 0.76  CALCIUM 9.5 9.0  MG  --  1.8   GFR: Estimated Creatinine Clearance: 109.7 mL/min (by C-G formula based on SCr of 0.76 mg/dL). Liver Function Tests: Recent Labs  Lab 04/28/20 0636  AST 16  ALT 14  ALKPHOS 52  BILITOT 0.6  PROT 7.6  ALBUMIN 4.3   Recent Labs  Lab 04/28/20 0636  LIPASE 33   No results for  input(s): AMMONIA in the last 168 hours. Coagulation Profile: No results for input(s): INR, PROTIME in the last 168 hours. Cardiac Enzymes: No results for input(s): CKTOTAL, CKMB, CKMBINDEX, TROPONINI in the last 168 hours. BNP (last 3 results) No results for input(s): PROBNP in the last 8760 hours. HbA1C: No results for input(s): HGBA1C in the last 72 hours. CBG: Recent Labs  Lab 04/29/20 2003  GLUCAP 97   Lipid Profile: No results for input(s): CHOL, HDL, LDLCALC, TRIG, CHOLHDL, LDLDIRECT in the last 72 hours. Thyroid Function Tests: No results for input(s): TSH, T4TOTAL, FREET4, T3FREE, THYROIDAB in the last 72 hours. Anemia Panel: No results for input(s): VITAMINB12, FOLATE, FERRITIN, TIBC, IRON, RETICCTPCT in the last 72 hours. Sepsis Labs: No results for input(s): PROCALCITON, LATICACIDVEN in the last 168 hours.  Recent Results (from the past 240 hour(s))  SARS CORONAVIRUS 2 (TAT 6-24 HRS) Nasopharyngeal Nasopharyngeal Swab     Status: None   Collection Time: 04/28/20 10:29 AM   Specimen: Nasopharyngeal Swab  Result Value Ref Range Status   SARS Coronavirus 2 NEGATIVE NEGATIVE Final    Comment: (NOTE) SARS-CoV-2 target nucleic acids are NOT DETECTED.  The SARS-CoV-2 RNA is generally detectable in upper and lower respiratory specimens during the acute phase of infection. Negative results do not preclude SARS-CoV-2 infection, do not rule out co-infections with other pathogens, and should not be used as the sole basis for treatment or other patient management decisions. Negative results must be combined with clinical observations, patient history, and epidemiological information. The expected result is Negative.  Fact Sheet for Patients: SugarRoll.be  Fact Sheet for Healthcare Providers: https://www.woods-mathews.com/  This test is not yet approved or cleared by the [place] FDA and  has been authorized for detection  and/or diagnosis of SARS-CoV-2 by FDA under an Emergency Use Authorization (EUA). This EUA will remain  in effect (meaning this test can be used) for the duration of the COVID-19 declaration under Se ction 564(b)(1) of the Act, 21 U.S.C. section 360bbb-3(b)(1), unless the authorization is terminated or revoked sooner.  Performed at [facility], [place] [address] [place], [place] [address]       Imaging Studies   DG Abd 1 View  Result Date: 04/29/2020 CLINICAL DATA:  Nasogastric tube placement EXAM: ABDOMEN - 1 VIEW COMPARISON:  None. FINDINGS: Nasogastric tube is identified with distal tip in the proximal stomach. IMPRESSION: Nasogastric tube is identified with distal tip in the proximal stomach. Electronically Signed   By: Abelardo Diesel M.D.   On: 04/29/2020 15:11  Medications   Scheduled Meds: . heparin  5,000 Units Subcutaneous Q8H  . pantoprazole (PROTONIX) IV  40 mg Intravenous Q12H   Continuous Infusions: . lactated ringers 100 mL/hr at 04/30/20 1841  . promethazine (PHENERGAN) injection         LOS: 2 days    Time spent: 30 minutes    Max Sane, MD [facility]  04/30/2020, 9:37 PM      If 7PM-7AM, please contact night-coverage. How to contact the [facility] Attending or Consulting provider [place] or covering provider during after hours [place], for this patient?    1. Check the care team in [facility] and look for a) attending/consulting TRH provider listed and b) the [facility] team listed 2. Log into [email or web address] and use [place]'s universal password to access. If you do not have the password, please contact the hospital operator. 3. Locate the [facility] provider you are looking for under [facility] and page to a number that you can be directly reached. 4. If you still have difficulty reaching the provider, please page the [facility] (Director on Call) for the Hospitalists listed on amion for assistance.

## 2020-04-30 NOTE — Progress Notes (Signed)
Patient has had 2 episodes of vomiting totaling 250 ml of brown color emesis. ngt intact with 400 ml brown colored output since 19:30.  zofran and phenergan given as ordered. Paula Cooley

## 2020-04-30 NOTE — Progress Notes (Signed)
Call received from floor, patient writhing around in bed, yelling and complaining of abdominal pain.  Significant other and support  Dog at bedside. Vital signs stable, patient warm and dry.  [place] NP notified plans to place NGT and continue to monitor.

## 2020-04-30 NOTE — Progress Notes (Signed)
Subjective:  CC: Paula Cooley is a 30 y.o. female  Hospital stay day 2,   SBO  HPI: Couple more episodes of emesis around NG tube, but states pain is slightly better than yesterday.  No flatus or BM yet  ROS:  General: Denies weight loss, weight gain, fatigue, fevers, chills, and night sweats. Heart: Denies chest pain, palpitations, racing heart, irregular heartbeat, leg pain or swelling, and decreased activity tolerance. Respiratory: Denies breathing difficulty, shortness of breath, wheezing, cough, and sputum. GI: Denies change in appetite, heartburn, nausea, vomiting, constipation, diarrhea, and blood in stool. GU: Denies difficulty urinating, pain with urinating, urgency, frequency, blood in urine.   Objective:   Temp:  [98.1 F (36.7 C)-98.9 F (37.2 C)] 98.1 F (36.7 C) (03/21 1955) Pulse Rate:  [89-112] 89 (03/21 1955) Resp:  [16-20] 20 (03/21 1955) BP: (110-125)/(74-84) 110/74 (03/21 1955) SpO2:  [97 %-99 %] 97 % (03/21 1955)     Height: 5' 6"  (167.6 cm) Weight: 77.1 kg BMI (Calculated): 27.45   Intake/Output this shift:   Intake/Output Summary (Last 24 hours) at 04/30/2020 2342 Last data filed at 04/30/2020 1932 Gross per 24 hour  Intake 1943.51 ml  Output 2350 ml  Net -406.49 ml    Constitutional :  alert, cooperative, appears stated age and no distress  Respiratory:  clear to auscultation bilaterally  Cardiovascular:  regular rate and rhythm  Gastrointestinal: soft, no guarding, but focal TTP in RLQ.   Skin: Cool and moist.   Psychiatric: Normal affect, non-agitated, not confused       LABS:  CMP Latest Ref Rng & Units 04/30/2020 04/28/2020 01/04/2019  Glucose 70 - 99 mg/dL 121(H) 114(H) 94  BUN 6 - 20 mg/dL 11 11 8   Creatinine 0.44 - 1.00 mg/dL 0.76 0.72 0.78  Sodium 135 - 145 mmol/L 139 137 141  Potassium 3.5 - 5.1 mmol/L 4.7 3.6 3.9  Chloride 98 - 111 mmol/L 107 105 104  CO2 22 - 32 mmol/L 20(L) 23 28  Calcium 8.9 - 10.3 mg/dL 9.0 9.5 8.6(L)  Total  Protein 6.5 - 8.1 g/dL - 7.6 -  Total Bilirubin 0.3 - 1.2 mg/dL - 0.6 -  Alkaline Phos 38 - 126 U/L - 52 -  AST 15 - 41 U/L - 16 -  ALT 0 - 44 U/L - 14 -   CBC Latest Ref Rng & Units 04/30/2020 04/28/2020 01/04/2019  WBC 4.0 - 10.5 K/uL 10.3 12.3(H) 7.8  Hemoglobin 12.0 - 15.0 g/dL 14.7 14.2 11.7(L)  Hematocrit 36.0 - 46.0 % 43.7 40.1 35.5(L)  Platelets 150 - 400 K/uL 254 244 364    RADS: n/a Assessment:   SBO, likely secondary to adhesions from previous surgery.  Pt still will like to avoid surgery as much as possible.  Will continue supportive care with NG decompression, pain control.  Ambulate with gum and candy.

## 2020-05-01 LAB — CBC
HCT: 36.7 % (ref 36.0–46.0)
Hemoglobin: 12.4 g/dL (ref 12.0–15.0)
MCH: 30.1 pg (ref 26.0–34.0)
MCHC: 33.8 g/dL (ref 30.0–36.0)
MCV: 89.1 fL (ref 80.0–100.0)
Platelets: 202 10*3/uL (ref 150–400)
RBC: 4.12 MIL/uL (ref 3.87–5.11)
RDW: 12.3 % (ref 11.5–15.5)
WBC: 6.1 10*3/uL (ref 4.0–10.5)
nRBC: 0 % (ref 0.0–0.2)

## 2020-05-01 LAB — BASIC METABOLIC PANEL
Anion gap: 8 (ref 5–15)
BUN: 13 mg/dL (ref 6–20)
CO2: 25 mmol/L (ref 22–32)
Calcium: 8.7 mg/dL — ABNORMAL LOW (ref 8.9–10.3)
Chloride: 108 mmol/L (ref 98–111)
Creatinine, Ser: 0.79 mg/dL (ref 0.44–1.00)
GFR, Estimated: >60 mL/min (ref >60)
Glucose, Bld: 86 mg/dL (ref 70–99)
Potassium: 3.7 mmol/L (ref 3.5–5.1)
Sodium: 141 mmol/L (ref 135–145)

## 2020-05-01 NOTE — Progress Notes (Signed)
PROGRESS NOTE    TONETTA NAPOLES   IOM:[id number]  DOB: [birth year]  PCP: Denita Lung, MD    DOA: 04/28/2020 LOS: 3   Brief Narrative   Paula Cooley is a 30 y.o. 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 Lysle Pearl, seasonal allergies and asthma who presented to the ED on 04/28/2020 with worsening generalized abdominal pain that started at 1 AM that morning.  She was found to have small bowel obstruction on CT abdomen/pelvis obtained in the ED.     Assessment & Plan   Principal Problem:   Abdominal pain Active Problems:   SBO (small bowel obstruction) (HCC)   SBO -conservative management.   --General surgery following, appreciate assistance --Tolerated clear liquid diet so advancing to full liquid today. --Pain and nausea control PRN --Plan to have NG tube clamping per surgery today   DVT prophylaxis: heparin injection 5,000 Units Start: 04/28/20 1400 SCDs Start: 04/28/20 1114   Diet:  Diet Orders (From admission, onward)    Start     Ordered   05/01/20 1453  Diet full liquid Room service appropriate? Yes; Fluid consistency: Thin  Diet effective now       Question Answer Comment  Room service appropriate? Yes   Fluid consistency: Thin      05/01/20 1452            Code Status: Full Code    Subjective 05/01/20    Patient awake.  Reports some irritation from NG tube.  Disposition Plan & Communication   Status is: Inpatient  Inpatient status appropriate due to severity of illness as above.  Dispo: The patient is from: home              Anticipated d/c is to: home              Patient currently not stable for d/c.   Difficult to place patient no   Family Communication: Mother updated on 3/21.  None today   Consults, Procedures, Significant Events   Consultants:   General surgery  Procedures:   NG tube  placement 3/20  Antimicrobials:  Anti-infectives (From admission, onward)   None        Micro    Objective   Vitals:   05/01/20 0759 05/01/20 1100 05/01/20 1314 05/01/20 1543  BP: 134/90 134/86 134/88 120/85  Pulse: 87 88 95 98  Resp: 18 18 18 16   Temp: 98.9 F (37.2 C) 98.9 F (37.2 C) 98.5 F (36.9 C) 98.8 F (37.1 C)  TempSrc: Oral Oral Oral Oral  SpO2: 99% 99% 100% 98%  Weight:      Height:        Intake/Output Summary (Last 24 hours) at 05/01/2020 1559 Last data filed at 05/01/2020 1220 Gross per 24 hour  Intake 2249.32 ml  Output 850 ml  Net 1399.32 ml   Filed Weights   04/28/20 0631  Weight: 77.1 kg    Physical Exam:    General exam: awake, alert, NG tube in place draining brown liquid Respiratory system: CTAB, no wheezes, rales or rhonchi, normal respiratory effort. Cardiovascular system: normal S1/S2, RRR Gastrointestinal system: soft, mild tenderness Central nervous system: no gross focal neurologic deficits, sleepy Extremities: moves all, no edema, normal tone Skin: dry, intact, normal temperature Psychiatry: difficult to assess as sleepy  Labs   Data Reviewed: I have personally  reviewed following labs and imaging studies  CBC: Recent Labs  Lab 04/28/20 0636 04/30/20 0537 05/01/20 0551  WBC 12.3* 10.3 6.1  HGB 14.2 14.7 12.4  HCT 40.1 43.7 36.7  MCV 86.8 88.8 89.1  PLT 244 254 956   Basic Metabolic Panel: Recent Labs  Lab 04/28/20 0636 04/30/20 0537 05/01/20 0551  NA 137 139 141  K 3.6 4.7 3.7  CL 105 107 108  CO2 23 20* 25  GLUCOSE 114* 121* 86  BUN 11 11 13   CREATININE 0.72 0.76 0.79  CALCIUM 9.5 9.0 8.7*  MG  --  1.8  --    GFR: Estimated Creatinine Clearance: 109.7 mL/min (by C-G formula based on SCr of 0.79 mg/dL). Liver Function Tests: Recent Labs  Lab 04/28/20 0636  AST 16  ALT 14  ALKPHOS 52  BILITOT 0.6  PROT 7.6  ALBUMIN 4.3   Recent Labs  Lab 04/28/20 0636  LIPASE 33   No results for input(s):  AMMONIA in the last 168 hours. Coagulation Profile: No results for input(s): INR, PROTIME in the last 168 hours. Cardiac Enzymes: No results for input(s): CKTOTAL, CKMB, CKMBINDEX, TROPONINI in the last 168 hours. BNP (last 3 results) No results for input(s): PROBNP in the last 8760 hours. HbA1C: No results for input(s): HGBA1C in the last 72 hours. CBG: Recent Labs  Lab 04/29/20 2003  GLUCAP 97   Lipid Profile: No results for input(s): CHOL, HDL, LDLCALC, TRIG, CHOLHDL, LDLDIRECT in the last 72 hours. Thyroid Function Tests: No results for input(s): TSH, T4TOTAL, FREET4, T3FREE, THYROIDAB in the last 72 hours. Anemia Panel: No results for input(s): VITAMINB12, FOLATE, FERRITIN, TIBC, IRON, RETICCTPCT in the last 72 hours. Sepsis Labs: No results for input(s): PROCALCITON, LATICACIDVEN in the last 168 hours.  Recent Results (from the past 240 hour(s))  SARS CORONAVIRUS 2 (TAT 6-24 HRS) Nasopharyngeal Nasopharyngeal Swab     Status: None   Collection Time: 04/28/20 10:29 AM   Specimen: Nasopharyngeal Swab  Result Value Ref Range Status   SARS Coronavirus 2 NEGATIVE NEGATIVE Final    Comment: (NOTE) SARS-CoV-2 target nucleic acids are NOT DETECTED.  The SARS-CoV-2 RNA is generally detectable in upper and lower respiratory specimens during the acute phase of infection. Negative results do not preclude SARS-CoV-2 infection, do not rule out co-infections with other pathogens, and should not be used as the sole basis for treatment or other patient management decisions. Negative results must be combined with clinical observations, patient history, and epidemiological information. The expected result is Negative.  Fact Sheet for Patients: SugarRoll.be  Fact Sheet for Healthcare Providers: https://www.woods-mathews.com/  This test is not yet approved or cleared by the [place] FDA and  has been authorized for detection and/or  diagnosis of SARS-CoV-2 by FDA under an Emergency Use Authorization (EUA). This EUA will remain  in effect (meaning this test can be used) for the duration of the COVID-19 declaration under Se ction 564(b)(1) of the Act, 21 U.S.C. section 360bbb-3(b)(1), unless the authorization is terminated or revoked sooner.  Performed at [facility], [place] [address], [place], [place] [address]       Imaging Studies   No results found.   Medications   Scheduled Meds: . heparin  5,000 Units Subcutaneous Q8H  . pantoprazole (PROTONIX) IV  40 mg Intravenous Q12H   Continuous Infusions: . lactated ringers 100 mL/hr at 05/01/20 1354  . promethazine (PHENERGAN) injection         LOS: 3 days  Time spent: 30 minutes    Max Sane, MD [facility]  05/01/2020, 3:59 PM      If 7PM-7AM, please contact night-coverage. How to contact the [facility] Attending or Consulting provider [place] or covering provider during after hours [place], for this patient?    1. Check the care team in [facility] and look for a) attending/consulting TRH provider listed and b) the [facility] team listed 2. Log into [email or web address] and use [place]'s universal password to access. If you do not have the password, please contact the hospital operator. 3. Locate the [facility] provider you are looking for under [facility] and page to a number that you can be directly reached. 4. If you still have difficulty reaching the provider, please page the [facility] (Director on Call) for the Hospitalists listed on amion for assistance.

## 2020-05-02 ENCOUNTER — Inpatient Hospital Stay: Payer: Self-pay

## 2020-05-02 MED ORDER — SIMETHICONE 80 MG PO CHEW
80.0000 mg | CHEWABLE_TABLET | Freq: Four times a day (QID) | ORAL | Status: DC
  Administered 2020-05-02 – 2020-05-03 (×3): 80 mg via ORAL
  Filled 2020-05-02 (×4): qty 1

## 2020-05-02 MED ORDER — DOCUSATE SODIUM 100 MG PO CAPS
100.0000 mg | ORAL_CAPSULE | Freq: Two times a day (BID) | ORAL | Status: DC
  Administered 2020-05-02 – 2020-05-03 (×3): 100 mg via ORAL
  Filled 2020-05-02 (×3): qty 1

## 2020-05-02 MED ORDER — DIATRIZOATE MEGLUMINE & SODIUM 66-10 % PO SOLN
90.0000 mL | Freq: Once | ORAL | Status: AC
  Administered 2020-05-02: 90 mL via ORAL

## 2020-05-02 NOTE — Progress Notes (Signed)
Subjective:  CC: Paula Cooley is a 30 y.o. female  Hospital stay day 4,   SBO  HPI: Had an episode of increasing pain overnight after a full liquid diet.  Pain improved with some medications with some decreased appetite.  Increasing hiccups as well.  ROS:  General: Denies weight loss, weight gain, fatigue, fevers, chills, and night sweats. Heart: Denies chest pain, palpitations, racing heart, irregular heartbeat, leg pain or swelling, and decreased activity tolerance. Respiratory: Denies breathing difficulty, shortness of breath, wheezing, cough, and sputum. GI: Denies change in appetite, heartburn, nausea, vomiting, constipation, diarrhea, and blood in stool. GU: Denies difficulty urinating, pain with urinating, urgency, frequency, blood in urine.   Objective:   Temp:  [97.8 F (36.6 C)-99.6 F (37.6 C)] 98 F (36.7 C) (03/23 1146) Pulse Rate:  [78-100] 100 (03/23 1146) Resp:  [14-20] 16 (03/23 1146) BP: (120-134)/(82-88) 124/82 (03/23 1146) SpO2:  [98 %-100 %] 98 % (03/23 1146)     Height: 5' 6"  (167.6 cm) Weight: 77.1 kg BMI (Calculated): 27.45   Intake/Output this shift:   Intake/Output Summary (Last 24 hours) at 05/02/2020 1149 Last data filed at 05/02/2020 1022 Gross per 24 hour  Intake 480 ml  Output 50 ml  Net 430 ml    Constitutional :  alert, cooperative, appears stated age and no distress  Respiratory:  clear to auscultation bilaterally  Cardiovascular:  regular rate and rhythm  Gastrointestinal: soft, no guarding, but focal TTP in RLQ.  Persistent  Skin: Cool and moist.   Psychiatric: Normal affect, non-agitated, not confused       LABS:  CMP Latest Ref Rng & Units 05/01/2020 04/30/2020 04/28/2020  Glucose 70 - 99 mg/dL 86 121(H) 114(H)  BUN 6 - 20 mg/dL 13 11 11   Creatinine 0.44 - 1.00 mg/dL 0.79 0.76 0.72  Sodium 135 - 145 mmol/L 141 139 137  Potassium 3.5 - 5.1 mmol/L 3.7 4.7 3.6  Chloride 98 - 111 mmol/L 108 107 105  CO2 22 - 32 mmol/L 25 20(L) 23   Calcium 8.9 - 10.3 mg/dL 8.7(L) 9.0 9.5  Total Protein 6.5 - 8.1 g/dL - - 7.6  Total Bilirubin 0.3 - 1.2 mg/dL - - 0.6  Alkaline Phos 38 - 126 U/L - - 52  AST 15 - 41 U/L - - 16  ALT 0 - 44 U/L - - 14   CBC Latest Ref Rng & Units 05/01/2020 04/30/2020 04/28/2020  WBC 4.0 - 10.5 K/uL 6.1 10.3 12.3(H)  Hemoglobin 12.0 - 15.0 g/dL 12.4 14.7 14.2  Hematocrit 36.0 - 46.0 % 36.7 43.7 40.1  Platelets 150 - 400 K/uL 202 254 244    RADS: n/a Assessment:   SBO, likely secondary to adhesions from previous surgery.  Pt still will like to avoid surgery as much as possible.  Slight Stoll and improvement of symptoms with full liquid diet.  Abdominal exam remains essentially unchanged, no worsening distention or tender to palpation at this point.  We will proceed with a small bowel follow-through to see if persistent obstruction/partial obstruction can be seen prior to advancing diet.  Okay to continue full liquids for now as patient seems to be comfortable on repeat exam this morning.  Plan updated and patient and mother at bedside verbalized understanding

## 2020-05-02 NOTE — Progress Notes (Signed)
Subjective:  CC: Paula Cooley is a 30 y.o. female  Hospital stay day 3,   SBO  HPI: Passing flatus overnight.  Minimal residual on clamp trial this morning so NG discontinued and clear liquid diet started.  Tolerating well pain much improved.  ROS:  General: Denies weight loss, weight gain, fatigue, fevers, chills, and night sweats. Heart: Denies chest pain, palpitations, racing heart, irregular heartbeat, leg pain or swelling, and decreased activity tolerance. Respiratory: Denies breathing difficulty, shortness of breath, wheezing, cough, and sputum. GI: Denies change in appetite, heartburn, nausea, vomiting, constipation, diarrhea, and blood in stool. GU: Denies difficulty urinating, pain with urinating, urgency, frequency, blood in urine.   Objective:   VSS     Height: 5' 6"  (167.6 cm) Weight: 77.1 kg BMI (Calculated): 27.45     Constitutional :  alert, cooperative, appears stated age and no distress  Respiratory:  clear to auscultation bilaterally  Cardiovascular:  regular rate and rhythm  Gastrointestinal: soft, no guarding, focal TTP in RLQ, improved.   Skin: Cool and moist.   Psychiatric: Normal affect, non-agitated, not confused       LABS:  N/A  RADS: n/a Assessment:   SBO, likely secondary to adhesions from previous surgery.  Seems to be resolving.  We will continue to advance diet slowly as tolerated.

## 2020-05-03 NOTE — Discharge Instructions (Signed)
Bowel Obstruction A bowel obstruction means that something is blocking the small or large bowel. The bowel is also called the intestine. It is the long tube that connects the stomach to the opening of the butt (anus). When something blocks the bowel, food and fluids cannot pass through like normal. This condition needs to be treated. Treatment depends on the cause of the problem and how bad the problem is. What are the causes? Common causes of this condition include:  Scar tissue (adhesions) from past surgery or from high-energy X-rays (radiation).  Recent surgery in the belly. This affects how food moves in the bowel.  Some diseases, such as: ? Irritation of the lining of the digestive tract (Crohn's disease). ? Irritation of small pouches in the bowel (diverticulitis).  Growths or tumors.  A bulging organ (hernia).  Twisting of the bowel (volvulus).  A foreign body.  Slipping of a part of the bowel into another part (intussusception).   What are the signs or symptoms? Symptoms of this condition include:  Pain in the belly.  Feeling sick to your stomach (nauseous).  Throwing up (vomiting).  Bloating in the belly.  Being unable to pass gas.  Trouble pooping (constipation).  Watery poop (diarrhea).  A lot of belching. How is this diagnosed? This condition may be diagnosed based on:  A physical exam.  Medical history.  Imaging tests, such as X-ray or CT scan.  Blood tests.  Urine tests. How is this treated? Treatment for this condition may include:  Fluids and pain medicines that are given through an IV tube. Your doctor may tell you not to eat or drink if you feel sick to your stomach and are throwing up.  Eating a clear liquid diet for a few days.  Putting a small tube (nasogastric tube) into the stomach. This will help with pain, discomfort, and nausea by removing blocked air and fluids from the stomach.  Surgery. This may be needed if other treatments do  not work. Follow these instructions at home: Medicines  Take over-the-counter and prescription medicines only as told by your doctor.  If you were prescribed an antibiotic medicine, take it as told by your doctor. Do not stop taking the antibiotic even if you start to feel better. General instructions  Follow your diet as told by your doctor. You may need to: ? Only drink clear liquids until you start to get better. ? Avoid solid foods.  Return to your normal activities as told by your doctor. Ask your doctor what activities are safe for you.  Do not sit for a long time without moving. Get up to take short walks every 1-2 hours. This is important. Ask for help if you feel weak or unsteady.  Keep all follow-up visits as told by your doctor. This is important. How is this prevented? After having a bowel obstruction, you may be more likely to have another. You can do some things to stop it from happening again.  If you have a long-term (chronic) disease, contact your doctor if you see changes or problems.  Take steps to prevent or treat trouble pooping. Your doctor may ask that you: ? Drink enough fluid to keep your pee (urine) pale yellow. ? Take over-the-counter or prescription medicines. ? Eat foods that are high in fiber. These include beans, whole grains, and fresh fruits and vegetables. ? Limit foods that are high in fat and sugar. These include fried or sweet foods.  Stay active. Ask your doctor which  exercises are safe for you.  Avoid stress.  Eat three small meals and three small snacks each day.  Work with a Publishing rights manager (dietitian) to make a meal plan that works for you.  Do not use any products that contain nicotine or tobacco, such as cigarettes and e-cigarettes. If you need help quitting, ask your doctor.   Contact a doctor if:  You have a fever.  You have chills. Get help right away if:  You have pain or cramps that get worse.  You throw up blood.  You are  sick to your stomach.  You cannot stop throwing up.  You cannot drink fluids.  You feel mixed up (confused).  You feel very thirsty (dehydrated).  Your belly gets more bloated.  You feel weak or you pass out (faint). Summary  A bowel obstruction means that something is blocking the small or large bowel.  Treatment may include IV fluids and pain medicine. You may also have a clear liquid diet, a small tube in your stomach, or surgery.  Drink clear liquids and avoid solid foods until you get better. This information is not intended to replace advice given to you by your health care provider. Make sure you discuss any questions you have with your health care provider. Document Revised: 06/10/2017 Document Reviewed: 06/10/2017 Elsevier Patient Education  [place].

## 2020-05-03 NOTE — Discharge Summary (Signed)
Physician Discharge Summary  Patient ID: Paula Cooley MRN: [id number] DOB/AGE: 10-25-1990 30 y.o.  Admit date: 04/28/2020 Discharge date: 05/03/20  Admission Diagnoses: SBO  Discharge Diagnoses:  Same as above  Discharged Condition: good  Hospital Course: admitted for above.  Slowly resume diet, had an episode of worsening pain with full liquid diet.  Small bowel follow-through ordered at this time but showed to be patent, contrast reaching the colon without any issue.  Pain subsequently subsided without any specific intervention and diet was able to be resumed and advanced with no issues.  At time of discharge patient was tolerating a regular diet, bowel movements recorded, stable to go home  Consults: None  Discharge Exam: Blood pressure 99/77, pulse 73, temperature 98.1 F (36.7 C), temperature source Oral, resp. rate 16, height 5' 6"  (1.676 m), weight 77.1 kg, last menstrual period 04/26/2020, SpO2 99 %. General appearance: alert, cooperative and no distress GI: soft, non-tender; bowel sounds normal; no masses,  no organomegaly  Disposition:  Discharge disposition: 01-Home or Self Care       Discharge Instructions    Discharge patient   Complete by: As directed    Discharge disposition: 01-Home or Self Care   Discharge patient date: 05/03/2020     Allergies as of 05/03/2020      Reactions   No Known Allergies       Medication List    TAKE these medications   amphetamine-dextroamphetamine 20 MG tablet Commonly known as: Adderall Take 1 tablet (20 mg total) by mouth 2 (two) times daily.   multivitamin with minerals tablet Take 1 tablet by mouth daily.       Follow-up Information    Bourg, Joram Venson, DO Follow up.   Specialty: Surgery Why: as needed Contact information: [address] [place] [address] [phone]                Total time spent arranging discharge was >75mn. Signed: IBenjamine Sprague3/24/2022, 12:58 PM

## 2020-05-03 NOTE — Progress Notes (Signed)
MD ordered patient to be discharged home.  Discharge instructions were reviewed with the patient and she voiced understanding.  Follow-up appointment as needed. No new prescriptions given to the patient.  IV was removed with catheter intact.  All patients questions were answered.  Patient left via wheelchair escorted by auxillary.

## 2020-06-08 ENCOUNTER — Other Ambulatory Visit: Payer: Self-pay

## 2020-06-08 DIAGNOSIS — F901 Attention-deficit hyperactivity disorder, predominantly hyperactive type: Secondary | ICD-10-CM

## 2020-06-11 ENCOUNTER — Other Ambulatory Visit: Payer: Self-pay

## 2020-06-11 DIAGNOSIS — F901 Attention-deficit hyperactivity disorder, predominantly hyperactive type: Secondary | ICD-10-CM

## 2020-06-11 NOTE — Telephone Encounter (Signed)
Received request to refill pts. Adderall but looks like pt. Was dismissed back on 04/16/20.

## 2020-06-12 NOTE — Telephone Encounter (Signed)
Pt was dismissed 04/02/20 by our billing office.  Another note has been sent.

## 2020-06-12 NOTE — Telephone Encounter (Signed)
Please refuse due to pt being dimissed in feb of this year. [place]

## 2020-06-13 ENCOUNTER — Other Ambulatory Visit: Payer: Self-pay

## 2020-06-13 DIAGNOSIS — F901 Attention-deficit hyperactivity disorder, predominantly hyperactive type: Secondary | ICD-10-CM

## 2020-06-13 NOTE — Telephone Encounter (Signed)
Please refuse due to pt being dismissed in feb. Please advise when done [facility]

## 2020-06-21 ENCOUNTER — Other Ambulatory Visit: Payer: Self-pay

## 2020-06-21 DIAGNOSIS — F901 Attention-deficit hyperactivity disorder, predominantly hyperactive type: Secondary | ICD-10-CM

## 2021-01-17 IMAGING — MR MR ABDOMEN W/O CM
6 of 9 series · 29 of 48 positions shown · non-contrast
Comparison: CT 12/27/2018 (same day)

CLINICAL DATA: Abdominal pain, appendicitis suspected. RIGHT lower
quadrant pain. RIGHT lower quadrant tended to palpation. Elevated
white blood cell count. Is

EXAM:
MRI ABDOMEN AND PELVIS WITHOUT CONTRAST
TECHNIQUE: Multiplanar multisequence MR imaging of the abdomen and pelvis was
performed. No intravenous contrast was administered.

[Series 13: cor haste · coronal · 4.0mm · 1.25mm/px · 3 of 44 slices shown]
[im 1/44]
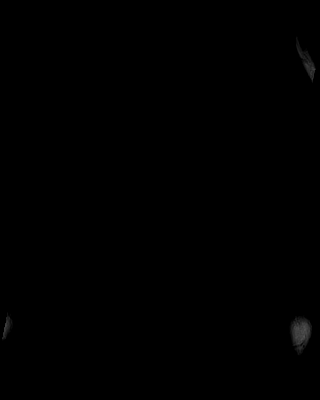
[im 22/44]
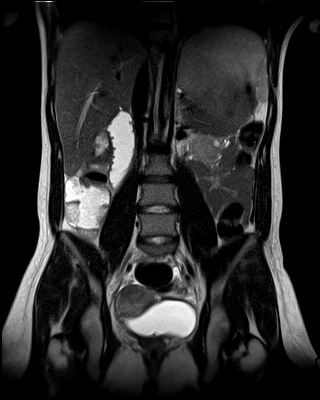
[im 44/44]
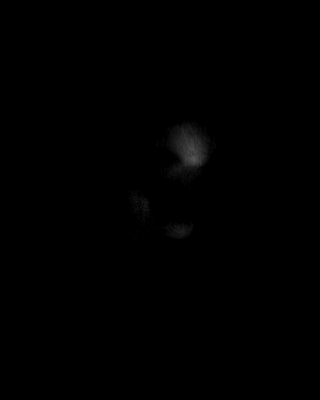

[Series 14: cor haste fs · coronal · 4.0mm · 1.25mm/px · 4 of 44 slices shown]
[im 1/44]
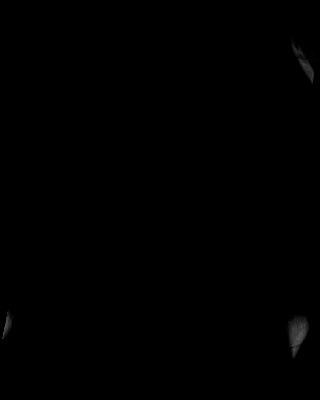
[im 15/44]
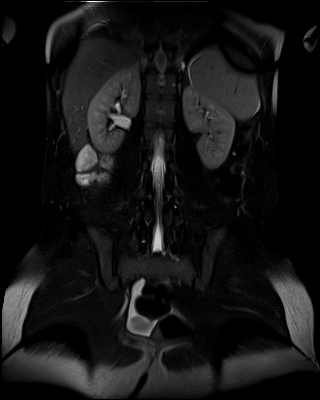
[im 29/44]
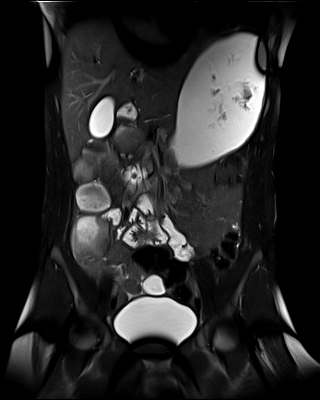
[im 44/44]
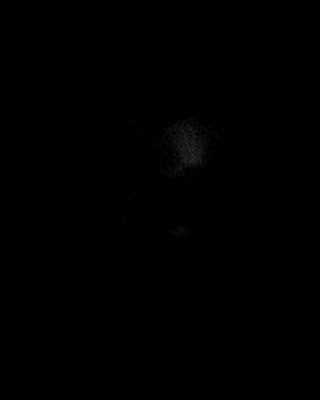

[Series 15: bSSFP · coronal · 4.0mm · 0.62mm/px · 4 of 44 slices shown (1 of 2)]
[im 1/44]
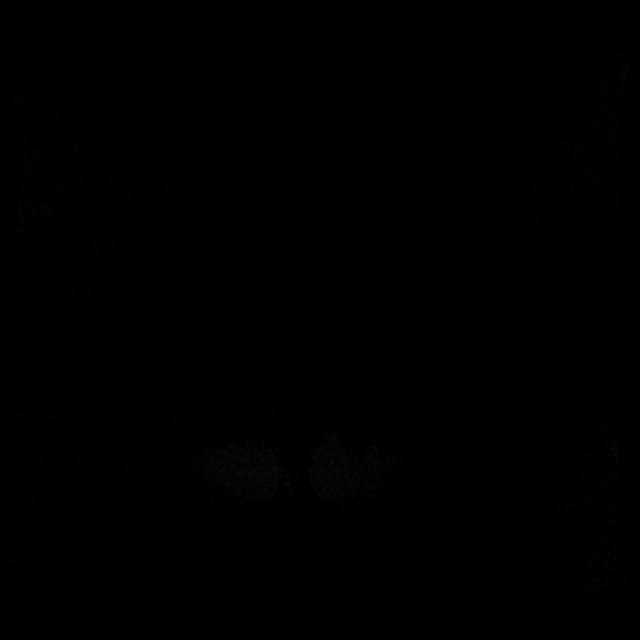
[im 15/44]
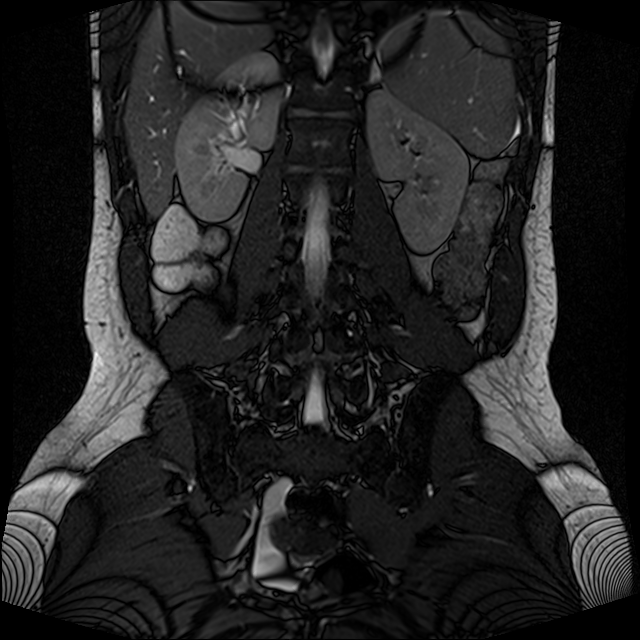
[im 29/44]
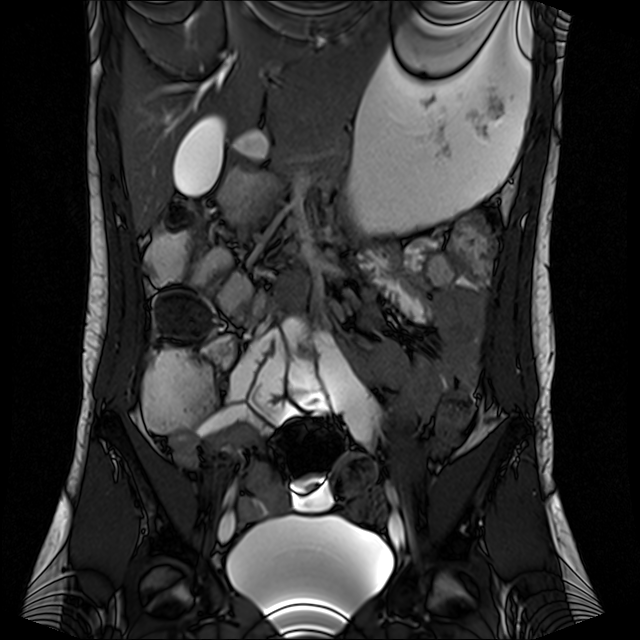
[im 44/44]
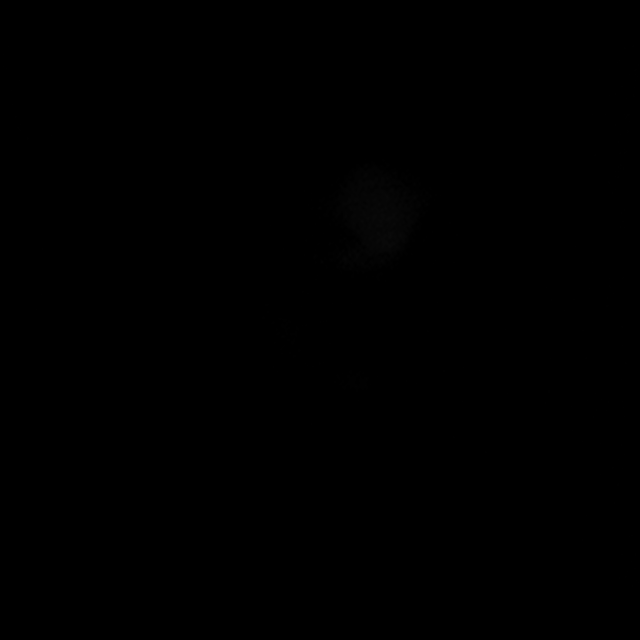

[Series 18: axial haste_comp · axial · 5.0mm · 1.19mm/px · z∈[-185,+325]mm · 7 of 86 slices shown]
[im 1/86]
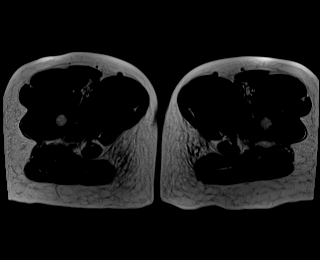
[im 15/86]
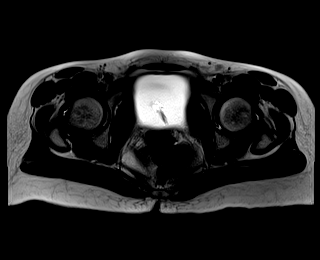
[im 29/86]
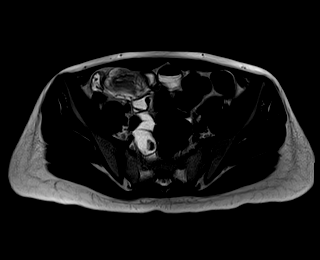
[im 43/86]
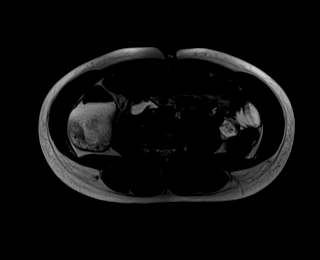
[im 57/86]
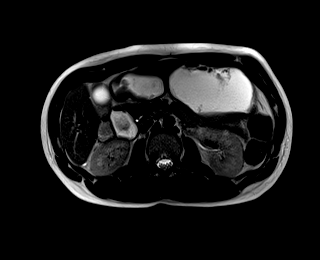
[im 71/86]
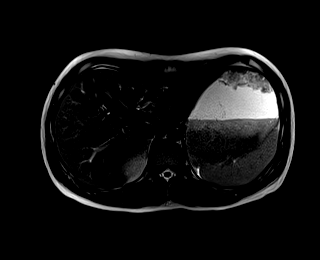
[im 86/86]
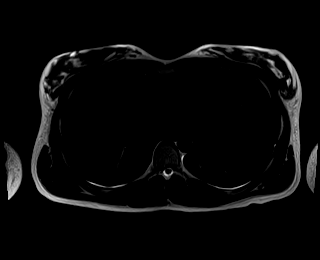

[Series 21: axial haste fs_comp · axial · 5.0mm · 1.19mm/px · z∈[-185,+325]mm · 7 of 86 slices shown]
[im 1/86]
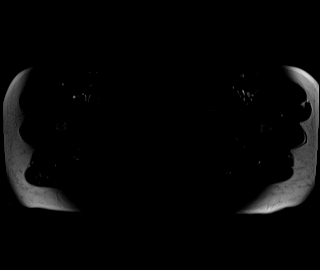
[im 15/86]
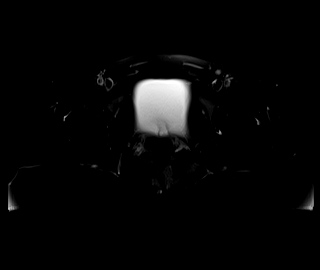
[im 29/86]
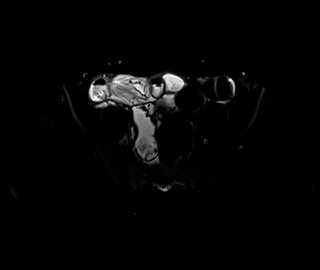
[im 43/86]
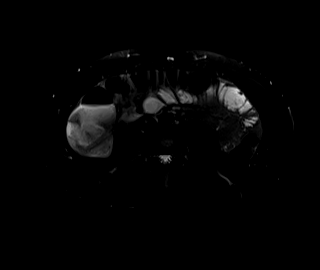
[im 57/86]
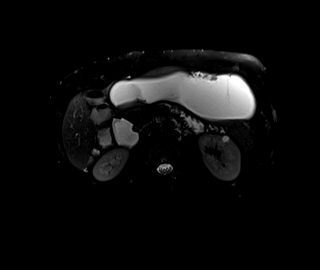
[im 71/86]
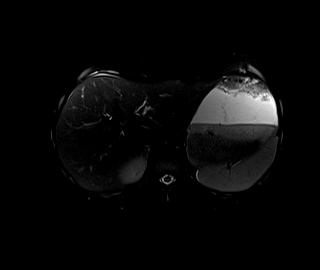
[im 86/86]
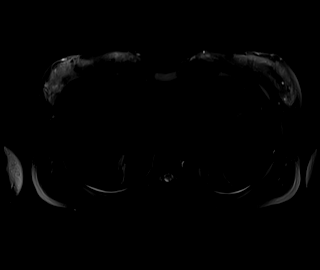

[Series 24: bSSFP · axial · 5.0mm · 0.68mm/px · z∈[-185,+67]mm · 4 of 86 slices shown (2 of 2)]
[im 1/86]
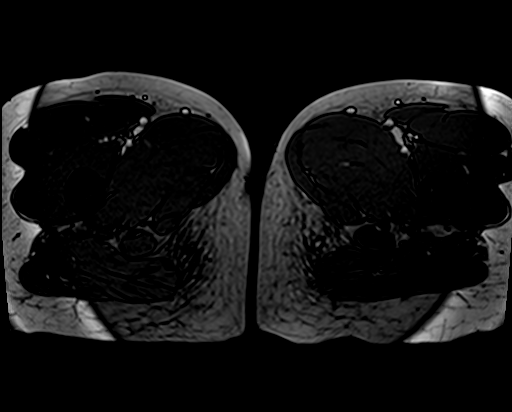
[im 15/86]
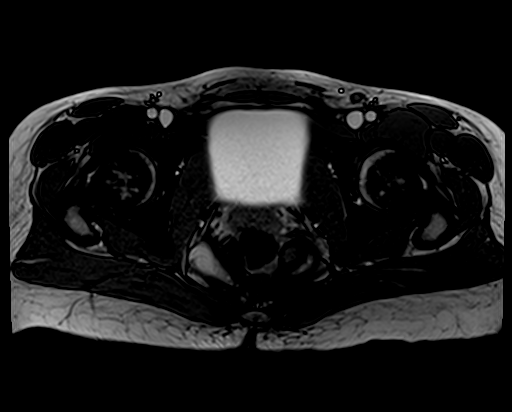
[im 29/86]
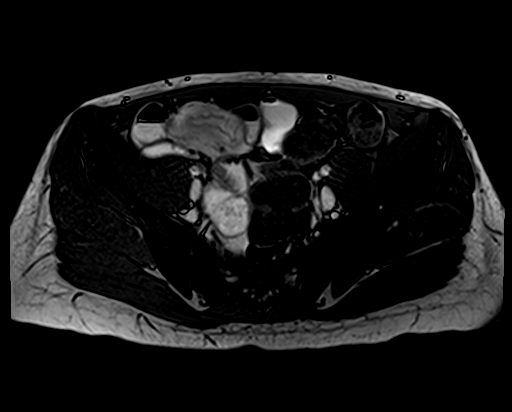
[im 43/86]
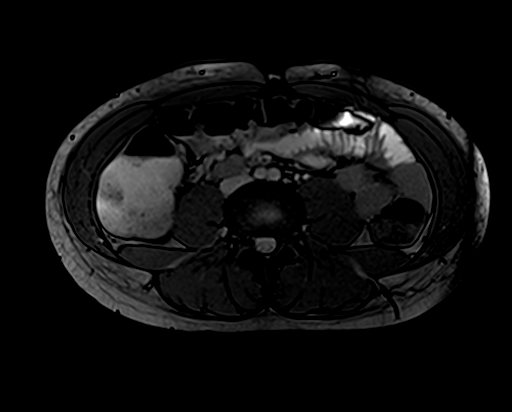

[29 of 48 positions shown; findings below may reference images not displayed]

FINDINGS: COMBINED FINDINGS FOR BOTH MR ABDOMEN AND PELVIS

Lower chest: Lung bases are clear.

Hepatobiliary: No focal hepatic lesion. No biliary duct dilatation.
Gallbladder is normal. Common bile duct is normal.

Pancreas: Pancreas is normal. No ductal dilatation. No pancreatic
inflammation.

Spleen: Normal spleen

Adrenals/urinary tract: Adrenal glands and kidneys are normal. The
ureters and bladder normal.

Stomach/Bowel: Stomach is distended by gastric contents. No
obstructing lesion identified. Proximal small bowel duodenum are
normal. The terminal ileum is normal.

The more distal ileum proximal terminal ileum is poorly defined
within the RIGHT lower quadrant with suggestive of
edema/inflammation (image 57/18).

There is a tubular structure in the RIGHT lower quadrant measuring
11 mm diameter (image series 18). Small amount fluid within
centrally within this structure. Difficult to ascertain if this is
loop of small bowel or the appendix.

In comparison to CT ,there is a a tubular structure in the same
vicinity best seen on coronal image 33/5. There is submucosal edema
within this tubular structure and enhancing mucosa. This is felt
separate from nearby ovary. Normal ovary is in the same region on
MRI coronal image 33 of/series 15 .

There is small amount of free fluid in the RIGHT pelvis.

The more distal colon is normal.  Rectosigmoid colon normal.

Vascular/Lymphatic: Abdominal aorta is normal caliber. No periportal
or retroperitoneal adenopathy. No pelvic adenopathy.

Reproductive: Uterus and ovaries appear normal. Small RIGHT ovary
measuring 1.7 x 1.0 cm present image 62/18. Normal LEFT ovary on
image 62/18

Other: Small amount free fluid in the RIGHT pelvis

Musculoskeletal: .
IMPRESSION: 1. In comparing present MRI and same day CT, there is a tubular
structure in the RIGHT lower quadrant with mucosal enhancement and
submucosal edema. Small amount free fluid the deep RIGHT pelvis.
Differential would include ACUTE APPENDICITIS, Meckel's
diverticulum, or inflamed loop of small bowel (enteritis). As this
is equivocal case, recommend surgical consultation for further
evaluation.
2. Mild inflammation within distal ileum.  No abscess formation.
3. Ovaries are normal.

Findings conveyed [place], Rber 12/28/2018  at[DATE].

## 2021-03-07 ENCOUNTER — Ambulatory Visit
Admission: RE | Admit: 2021-03-07 | Discharge: 2021-03-07 | Disposition: A | Discharge status: Home / Self Care | Payer: 59 | Source: Ambulatory Visit | Attending: Surgery | Admitting: Surgery

## 2021-03-07 ENCOUNTER — Other Ambulatory Visit: Payer: Self-pay | Admitting: Surgery

## 2021-03-07 DIAGNOSIS — K56609 Unspecified intestinal obstruction, unspecified as to partial versus complete obstruction: Secondary | ICD-10-CM

## 2021-03-07 MED ORDER — ONDANSETRON HCL 4 MG PO TABS: 4.0000 mg | ORAL_TABLET | Freq: Every day | ORAL | 1 refills | Status: AC | PRN

## 2021-03-07 MED ORDER — IOHEXOL 300 MG/ML  SOLN
100.0000 mL | Freq: Once | INTRAMUSCULAR | Status: AC | PRN
  Administered 2021-03-07: 100 mL via INTRAVENOUS

## 2021-03-07 MED ORDER — TRAMADOL HCL 50 MG PO TABS: 50.0000 mg | ORAL_TABLET | Freq: Four times a day (QID) | ORAL | 0 refills | Status: DC | PRN

## 2021-04-09 ENCOUNTER — Encounter: Payer: Self-pay | Admitting: *Deleted

## 2021-04-09 ENCOUNTER — Inpatient Hospital Stay
Admission: EM | Admit: 2021-04-09 | Discharge: 2021-04-12 | DRG: 389 | Disposition: A | Discharge status: Home / Self Care | Payer: 59 | Attending: Hospitalist | Admitting: Hospitalist

## 2021-04-09 ENCOUNTER — Emergency Department: Payer: 59

## 2021-04-09 DIAGNOSIS — R1110 Vomiting, unspecified: Secondary | ICD-10-CM | POA: Diagnosis present

## 2021-04-09 DIAGNOSIS — D72829 Elevated white blood cell count, unspecified: Secondary | ICD-10-CM | POA: Diagnosis present

## 2021-04-09 DIAGNOSIS — Z20822 Contact with and (suspected) exposure to covid-19: Secondary | ICD-10-CM | POA: Diagnosis present

## 2021-04-09 DIAGNOSIS — Z28310 Unvaccinated for covid-19: Secondary | ICD-10-CM

## 2021-04-09 DIAGNOSIS — Z79899 Other long term (current) drug therapy: Secondary | ICD-10-CM

## 2021-04-09 DIAGNOSIS — J45990 Exercise induced bronchospasm: Secondary | ICD-10-CM | POA: Diagnosis present

## 2021-04-09 DIAGNOSIS — E872 Acidosis, unspecified: Secondary | ICD-10-CM | POA: Diagnosis present

## 2021-04-09 DIAGNOSIS — K56609 Unspecified intestinal obstruction, unspecified as to partial versus complete obstruction: Secondary | ICD-10-CM

## 2021-04-09 DIAGNOSIS — Z87891 Personal history of nicotine dependence: Secondary | ICD-10-CM

## 2021-04-09 DIAGNOSIS — Q430 Meckel's diverticulum (displaced) (hypertrophic): Secondary | ICD-10-CM

## 2021-04-09 DIAGNOSIS — R109 Unspecified abdominal pain: Secondary | ICD-10-CM | POA: Diagnosis present

## 2021-04-09 DIAGNOSIS — J45909 Unspecified asthma, uncomplicated: Secondary | ICD-10-CM | POA: Diagnosis present

## 2021-04-09 DIAGNOSIS — N179 Acute kidney failure, unspecified: Secondary | ICD-10-CM | POA: Diagnosis present

## 2021-04-09 DIAGNOSIS — E860 Dehydration: Secondary | ICD-10-CM | POA: Diagnosis present

## 2021-04-09 DIAGNOSIS — E876 Hypokalemia: Secondary | ICD-10-CM | POA: Diagnosis present

## 2021-04-09 DIAGNOSIS — K565 Intestinal adhesions [bands], unspecified as to partial versus complete obstruction: Principal | ICD-10-CM | POA: Diagnosis present

## 2021-04-09 DIAGNOSIS — F909 Attention-deficit hyperactivity disorder, unspecified type: Secondary | ICD-10-CM | POA: Diagnosis present

## 2021-04-09 LAB — RESP PANEL BY RT-PCR (FLU A&B, COVID) ARPGX2
Influenza A by PCR: NEGATIVE
Influenza B by PCR: NEGATIVE
SARS Coronavirus 2 by RT PCR: NEGATIVE

## 2021-04-09 LAB — HCG, QUANTITATIVE, PREGNANCY: hCG, Beta Chain, Quant, S: <1 m[IU]/mL (ref <5)

## 2021-04-09 LAB — COMPREHENSIVE METABOLIC PANEL
ALT: 20 U/L (ref 0–44)
AST: 24 U/L (ref 15–41)
Albumin: 5.3 g/dL — ABNORMAL HIGH (ref 3.5–5.0)
Alkaline Phosphatase: 82 U/L (ref 38–126)
Anion gap: 19 — ABNORMAL HIGH (ref 5–15)
BUN: 18 mg/dL (ref 6–20)
CO2: 23 mmol/L (ref 22–32)
Calcium: 12.3 mg/dL — ABNORMAL HIGH (ref 8.9–10.3)
Chloride: 94 mmol/L — ABNORMAL LOW (ref 98–111)
Creatinine, Ser: 1.35 mg/dL — ABNORMAL HIGH (ref 0.44–1.00)
GFR, Estimated: 55 mL/min — ABNORMAL LOW (ref >60)
Glucose, Bld: 160 mg/dL — ABNORMAL HIGH (ref 70–99)
Potassium: 2.9 mmol/L — ABNORMAL LOW (ref 3.5–5.1)
Sodium: 136 mmol/L (ref 135–145)
Total Bilirubin: 0.5 mg/dL (ref 0.3–1.2)
Total Protein: 10 g/dL — ABNORMAL HIGH (ref 6.5–8.1)

## 2021-04-09 LAB — CBC
HCT: 50.4 % — ABNORMAL HIGH (ref 36.0–46.0)
Hemoglobin: 17.4 g/dL — ABNORMAL HIGH (ref 12.0–15.0)
MCH: 28.9 pg (ref 26.0–34.0)
MCHC: 34.5 g/dL (ref 30.0–36.0)
MCV: 83.6 fL (ref 80.0–100.0)
Platelets: 426 10*3/uL — ABNORMAL HIGH (ref 150–400)
RBC: 6.03 MIL/uL — ABNORMAL HIGH (ref 3.87–5.11)
RDW: 12.4 % (ref 11.5–15.5)
WBC: 20.3 10*3/uL — ABNORMAL HIGH (ref 4.0–10.5)
nRBC: 0 % (ref 0.0–0.2)

## 2021-04-09 LAB — LACTIC ACID, PLASMA: Lactic Acid, Venous: 2.6 mmol/L (ref 0.5–1.9)

## 2021-04-09 LAB — LIPASE, BLOOD: Lipase: 45 U/L (ref 11–51)

## 2021-04-09 MED ORDER — FENTANYL CITRATE PF 50 MCG/ML IJ SOSY: 12.5000 ug | PREFILLED_SYRINGE | INTRAMUSCULAR | Status: DC | PRN

## 2021-04-09 MED ORDER — ACETAMINOPHEN 500 MG PO TABS: 1000.0000 mg | ORAL_TABLET | Freq: Four times a day (QID) | ORAL | Status: DC | PRN

## 2021-04-09 MED ORDER — POTASSIUM CHLORIDE 10 MEQ/100ML IV SOLN
10.0000 meq | INTRAVENOUS | Status: AC
  Administered 2021-04-10: 10 meq via INTRAVENOUS
  Filled 2021-04-09: qty 100

## 2021-04-09 MED ORDER — SODIUM CHLORIDE 0.9 % IV SOLN
2.0000 g | Freq: Once | INTRAVENOUS | Status: AC
  Administered 2021-04-09: 2 g via INTRAVENOUS
  Filled 2021-04-09: qty 20

## 2021-04-09 MED ORDER — ACETAMINOPHEN 650 MG RE SUPP: 650.0000 mg | Freq: Four times a day (QID) | RECTAL | Status: DC | PRN

## 2021-04-09 MED ORDER — ONDANSETRON HCL 4 MG/2ML IJ SOLN
4.0000 mg | Freq: Four times a day (QID) | INTRAMUSCULAR | Status: DC | PRN
  Administered 2021-04-10 – 2021-04-11 (×2): 4 mg via INTRAVENOUS
  Filled 2021-04-09 (×2): qty 2

## 2021-04-09 MED ORDER — IOHEXOL 300 MG/ML  SOLN
100.0000 mL | Freq: Once | INTRAMUSCULAR | Status: AC | PRN
  Administered 2021-04-09: 100 mL via INTRAVENOUS

## 2021-04-09 MED ORDER — ONDANSETRON HCL 4 MG/2ML IJ SOLN
4.0000 mg | Freq: Once | INTRAMUSCULAR | Status: AC
  Administered 2021-04-09: 4 mg via INTRAVENOUS
  Filled 2021-04-09: qty 2

## 2021-04-09 MED ORDER — METRONIDAZOLE 500 MG/100ML IV SOLN
500.0000 mg | Freq: Once | INTRAVENOUS | Status: AC
  Administered 2021-04-09: 500 mg via INTRAVENOUS
  Filled 2021-04-09: qty 100

## 2021-04-09 MED ORDER — POTASSIUM CHLORIDE CRYS ER 20 MEQ PO TBCR
40.0000 meq | EXTENDED_RELEASE_TABLET | Freq: Once | ORAL | Status: AC
  Administered 2021-04-09: 40 meq via ORAL
  Filled 2021-04-09: qty 2

## 2021-04-09 MED ORDER — FENTANYL CITRATE PF 50 MCG/ML IJ SOSY
50.0000 ug | PREFILLED_SYRINGE | Freq: Once | INTRAMUSCULAR | Status: AC
  Administered 2021-04-09: 50 ug via INTRAVENOUS
  Filled 2021-04-09: qty 1

## 2021-04-09 MED ORDER — MORPHINE SULFATE (PF) 2 MG/ML IV SOLN
2.0000 mg | INTRAVENOUS | Status: DC | PRN
  Administered 2021-04-10: 2 mg via INTRAVENOUS
  Filled 2021-04-09: qty 1

## 2021-04-09 MED ORDER — HYDROMORPHONE HCL 1 MG/ML IJ SOLN
1.0000 mg | Freq: Once | INTRAMUSCULAR | Status: AC
  Administered 2021-04-09: 1 mg via INTRAMUSCULAR
  Filled 2021-04-09: qty 1

## 2021-04-09 MED ORDER — ONDANSETRON HCL 4 MG PO TABS: 4.0000 mg | ORAL_TABLET | Freq: Four times a day (QID) | ORAL | Status: DC | PRN

## 2021-04-09 MED ORDER — SODIUM CHLORIDE 0.9 % IV BOLUS
1000.0000 mL | Freq: Once | INTRAVENOUS | Status: AC
  Administered 2021-04-09: 1000 mL via INTRAVENOUS

## 2021-04-09 MED ORDER — SODIUM CHLORIDE 0.9 % IV SOLN: INTRAVENOUS | Status: AC

## 2021-04-09 NOTE — ED Provider Notes (Signed)
[facility] Provider Note    Event Date/Time   First MD Initiated Contact with Patient 04/09/21 1943     (approximate)   History   Abdominal Pain   HPI  Paula Cooley is a 31 y.o. female with history of bowel obstruction presents emergency department complaining of vomiting today.  No diarrhea.  Having severe abdominal pain.  States her legs are starting to cramp due to the amount of vomiting.  No fever/chills.  No chest pain.  No shortness of breath.      Physical Exam   Triage Vital Signs: ED Triage Vitals  Enc Vitals Group     BP 04/09/21 1847 (!) 168/100     Pulse Rate 04/09/21 1847 (!) 112     Resp 04/09/21 1847 20     Temp 04/09/21 1847 98 F (36.7 C)     Temp Source 04/09/21 1847 Oral     SpO2 04/09/21 1847 98 %     Weight 04/09/21 1843 175 lb (79.4 kg)     Height 04/09/21 1843 5' 6"  (1.676 m)     Head Circumference --      Peak Flow --      Pain Score 04/09/21 1843 9     Pain Loc --      Pain Edu? --      Excl. in [place]? --     Most recent vital signs: Vitals:   04/09/21 2200 04/09/21 2300  BP: 112/86 122/86  Pulse: 68 94  Resp: 11 13  Temp:    SpO2: 97% 100%     General: Awake, no distress.   CV:  Good peripheral perfusion. regular rate and  rhythm Resp:  Normal effort. Lungs CTA Abd:  No distention.  Tender in the right and left lower quadrants Other:      ED Results / Procedures / Treatments   Labs (all labs ordered are listed, but only abnormal results are displayed) Labs Reviewed  COMPREHENSIVE METABOLIC PANEL - Abnormal; Notable for the following components:      Result Value   Potassium 2.9 (*)    Chloride 94 (*)    Glucose, Bld 160 (*)    Creatinine, Ser 1.35 (*)    Calcium 12.3 (*)    Total Protein 10.0 (*)    Albumin 5.3 (*)    GFR, Estimated 55 (*)    Anion gap 19 (*)    All other components within normal limits  CBC - Abnormal; Notable for the following components:   WBC 20.3 (*)    RBC 6.03 (*)     Hemoglobin 17.4 (*)    HCT 50.4 (*)    Platelets 426 (*)    All other components within normal limits  LACTIC ACID, PLASMA - Abnormal; Notable for the following components:   Lactic Acid, Venous 2.6 (*)    All other components within normal limits  CULTURE, BLOOD (SINGLE)  RESP PANEL BY RT-PCR (FLU A&B, COVID) ARPGX2  LIPASE, BLOOD  HCG, QUANTITATIVE, PREGNANCY  URINALYSIS, ROUTINE W REFLEX MICROSCOPIC  LACTIC ACID, PLASMA  BASIC METABOLIC PANEL  CBC  MAGNESIUM  POC URINE PREG, ED     EKG     RADIOLOGY Abdomen 1 view    PROCEDURES:   Procedures   MEDICATIONS ORDERED IN ED: Medications  cefTRIAXone (ROCEPHIN) 2 g in sodium chloride 0.9 % 100 mL IVPB (2 g Intravenous New Bag/Given 04/09/21 2300)  metroNIDAZOLE (FLAGYL) IVPB 500 mg (500 mg Intravenous New  Bag/Given 04/09/21 2258)  0.9 %  sodium chloride infusion ( Intravenous New Bag/Given 04/09/21 2256)  acetaminophen (TYLENOL) tablet 1,000 mg (has no administration in time range)    Or  acetaminophen (TYLENOL) suppository 650 mg (has no administration in time range)  ondansetron (ZOFRAN) tablet 4 mg (has no administration in time range)    Or  ondansetron (ZOFRAN) injection 4 mg (has no administration in time range)  potassium chloride 10 mEq in 100 mL IVPB (has no administration in time range)  HYDROmorphone (DILAUDID) injection 1 mg (1 mg Intramuscular Given 04/09/21 2016)  sodium chloride 0.9 % bolus 1,000 mL (1,000 mLs Intravenous New Bag/Given 04/09/21 2101)  ondansetron (ZOFRAN) injection 4 mg (4 mg Intravenous Given 04/09/21 2059)  potassium chloride SA (KLOR-CON M) CR tablet 40 mEq (40 mEq Oral Given 04/09/21 2112)  fentaNYL (SUBLIMAZE) injection 50 mcg (50 mcg Intravenous Given 04/09/21 2310)  iohexol (OMNIPAQUE) 300 MG/ML solution 100 mL (100 mLs Intravenous Contrast Given 04/09/21 2237)     IMPRESSION / MDM / ASSESSMENT AND PLAN / ED COURSE  I reviewed the triage vital signs and the nursing notes.                               Differential diagnosis includes, but is not limited to, SBO, gastroenteritis, acute cholecystitis, acute appendicitis, acute diverticulitis,  Labs and imaging ordered.  We will do plain x-ray first and proceed to either ultrasound or CT if needed.  Patient's labs are concerning for infection, WBC of 20.3, H&H are little elevated, comprehensive metabolic panel is also concerning as her potassium is 2.9, glucose 160, GFR is decreased at 55 with creatinine 1.35 for anion gap is also elevated at 19  We will give the patient potassium and fluids.  She is more comfortable since giving the pain medication.  I ordered Dilaudid 1 mg IM.  Do feel that the low potassium is causing the severe muscle cramps in her legs.  Do feel that we need to determine the site of infection due to her past history of surgery for bowel obstructions.  Last surgery was in March 2020.  Last obstruction was last March.  ----------------------------------------- 10:22 PM on 04/09/2021 ----------------------------------------- Lactic acid has returned at 2.6.  Indicating sepsis.  Sepsis protocols were started.  Patient was given more pain medication, COVID test ordered for hospitalization.  Intra-abdominal antibiotics started.  Will await CT to decide if she is surgical versus hospitalist for admission.  I did independently review the CT of the abdomen.  I do feel there is a small bowel obstruction as there are very large amount of dilated bowel loops noted. Radiologist confirms SBO  Consult to surgery, consult hospitalist  Discussed with Dr. Archie Balboa, recommends go ahead and do NG tube and have hospitalist admit.  Patient is refusing NG tube.  Discussed with Dr. Tobie Poet for admission for SBO.  Sepsis is most likely reactive.  Told her could consult surgery to consult while in the floor.  Secure message was sent to Dr. Linus Salmons       FINAL CLINICAL IMPRESSION(S) / ED DIAGNOSES   Final diagnoses:   SBO (small bowel obstruction) ([place])     Rx / DC Orders   ED Discharge Orders     None        Note:  This document was prepared using Dragon voice recognition software and may include unintentional dictation errors.    Versie Starks,  PA-C 04/09/21 2328    Nance Pear, MD 04/09/21 [phone]

## 2021-04-09 NOTE — Hospital Course (Addendum)
Ms. Derrika Ruffalo is a 31 year old female with history of perforated Meckel's diverticulum, history of small bowel obstruction, ADHD, who presents emergency department for chief concerns of abdominal pain.  Vitals in the emergency department showed temperature 98, respiration rate 20, blood heart rate of 112, blood pressure 168/6100, SPO2 98% on room air.  Serum sodium 136, potassium 2.9, chloride 94, bicarb 23, BUN of 18, serum creatinine of 1.35, nonfasting blood glucose 160, GFR 55.  WBC 20.3, hemoglobin 17.4, platelets of 426.  Lactic acid was elevated 2.6.  Beta-hCG was negative.  ED treatment: Fentanyl 50 mcg, Dilaudid 1 mg, ondansetron 4 mg, potassium chloride 40 mill equivalent p.o., ceftriaxone 2 g IV, metronidazole 500 mg IV, sodium chloride 1 L bolus, patient was also started on sodium chloride 150 mL/h infusion.

## 2021-04-09 NOTE — ED Notes (Signed)
Notified Dr. Archie Balboa and Ashok Cordia (PA) of pt's lactic acid level of 2.6.

## 2021-04-09 NOTE — ED Notes (Signed)
Patient refuses to have NG tube placed in the ED; states "the last time she had it done she had a panic attack and needed to be held down." Pt requests to be "heavily sedated" for NG tube placement. Notified ED provider Ashok Cordia, PA.

## 2021-04-09 NOTE — ED Notes (Signed)
Patient transported to CT 

## 2021-04-09 NOTE — ED Triage Notes (Signed)
Pt to triage via wheelchair.  Pt reports vomiting today.  No diarrhea.  Pt has abd pain.  Hx SBO.  Pt took zofran with some relief.

## 2021-04-09 NOTE — Progress Notes (Signed)
CODE SEPSIS - PHARMACY COMMUNICATION  **Broad Spectrum Antibiotics should be administered within 1 hour of Sepsis diagnosis**  Time Code Sepsis Called/Page Received:  2/28 @ 2211  Antibiotics Ordered: Ceftriaxone, metronidazole  Time of 1st antibiotic administration: Metronidazole 500 mg IV x 1 on 2/28 @ 2258  Additional action taken by pharmacy:   If necessary, Name of Provider/Nurse Contacted:     Sidra Oldfield D ,PharmD Clinical Pharmacist  04/09/2021  11:35 PM

## 2021-04-09 NOTE — ED Notes (Signed)
Order for IV team consult placed; multiple attempts made to obtain IV access and labs.

## 2021-04-09 NOTE — H&P (Signed)
History and Physical   Paula Cooley:[id number] DOB: 11-29-1990 DOA: 04/09/2021  PCP: Merryl Hacker, No  Outpatient Specialists: Dr. Lysle Pearl  Patient coming from: home   I have personally briefly reviewed patient's old medical records in [place].  Chief Concern: abdominal pain   HPI: [name] is a 31 year old female with history of perforated Meckel's diverticulum, history of small bowel obstruction, ADHD, who presents emergency department for chief concerns of abdominal pain.  Vitals in the emergency department showed temperature 98, respiration rate 20, blood heart rate of 112, blood pressure 168/6100, SPO2 98% on room air.  Serum sodium 136, potassium 2.9, chloride 94, bicarb 23, BUN of 18, serum creatinine of 1.35, nonfasting blood glucose 160, GFR 55.  WBC 20.3, hemoglobin 17.4, platelets of 426.  Lactic acid was elevated 2.6.  Beta-hCG was negative.  ED treatment: Fentanyl 50 mcg, Dilaudid 1 mg, ondansetron 4 mg, potassium chloride 40 mill equivalent p.o., ceftriaxone 2 g IV, metronidazole 500 mg IV, sodium chloride 1 L bolus, patient was also started on sodium chloride 150 mL/h infusion.  4 pm, woke up with abdominal pain, sharp, stabbing below the belly button. She endorses subjective fever and chills. She states this felt similar to prior SBO episode.   She denies chest pain. She reports that when she hurts, it is difficult to breath.   Social history: She lives at home by herself. She denies tobacco, recreational drug use. She last had etoh about two weeks ago. She vomited too many times to count, and it was green vomitus. Her last bowel movement was 04/08/21 and it was hard balls.   Vaccination history: She is not vaccinated for covid 19 or influenza.   ROS: Constitutional: no weight change, no fever ENT/Mouth: no sore throat, no rhinorrhea Eyes: no eye pain, no vision changes Cardiovascular: no chest pain, + dyspnea,  no edema, no palpitations Respiratory: no  cough, no sputum, no wheezing Gastrointestinal: + nausea, + vomiting, no diarrhea, + constipation Genitourinary: no urinary incontinence, no dysuria, no hematuria Musculoskeletal: no arthralgias, no myalgias Skin: no skin lesions, no pruritus, Neuro: + weakness, no loss of consciousness, no syncope Psych: no anxiety, no depression, + decrease appetite Heme/Lymph: no bruising, no bleeding  ED Course: Discussed with emergency medicine provider, patient requiring hospitalization for chief concerns of SBO.  Assessment/Plan  Principal Problem:   Small bowel obstruction (HCC) Active Problems:   Chronic asthma   Exercise-induced asthma   Abdominal pain   Perforated Meckel's diverticulum   Intractable vomiting   Leukocytosis    Digestive Intractable vomiting Assessment & Plan - Ondansetron, p.o. and IV ordered - Phenergan 12.5 mg IV every 8 hours as needed for refractory nausea vomiting, there is a Producer, television/film/video of Phenergan, I have ordered to as needed dose  * Small bowel obstruction (HCC) Assessment & Plan - Symptomatic support - Patient appears stable and comfortable at this time - She denies any nausea - Continue supportive measures with as needed ondansetron - Morphine 2 mg IV every 4 hours as needed for moderate pain; Dilaudid 1 mg IV every 4 hours as needed for severe pain - I discussed extensively with patient that the goal is to reduce her pain by 50% and not to completely take the pain away - Increase use of opioid does increase his patient's risk of worsening obstruction - EDP has consulted general surgery, Dr. Peyton Najjar  Other Leukocytosis Assessment & Plan - With elevated lactic acid - Metronidazole and ceftriaxone IV ordered -  Single blood culture ordered by EDP, additional single blood culture ordered to total 2 - I suspect this is reactive as patient does not appear septic at this time  Abdominal pain Assessment & Plan - Presumed secondary to small bowel  obstruction  Chart reviewed.   DVT prophylaxis: TED hose Code Status: Full code Diet: N.p.o. Family Communication: Updated mother at bedside Disposition Plan: Pending clinical course Consults called: General surgery Admission status: Telemetry medical, observation  Past Medical History:  Diagnosis Date   Acetabular labrum tear    R shoulder    Acne    Asthma    exercise induced, no problems in quite a while, last use of inhaler- >one year   Hypoglycemia    uses glucose tablets PRN   Seasonal allergies    Past Surgical History:  Procedure Laterality Date   LAPAROSCOPY N/A 12/31/2018   Procedure: LAPAROSCOPY DIAGNOSTIC;  Surgeon: Benjamine Sprague, DO;  Location: [facility];  Service: General;  Laterality: N/A;   LAPAROTOMY N/A 12/31/2018   Procedure: EXPLORATORY LAPAROTOMY WITH REDUCTION OF HERNIA;  Surgeon: Benjamine Sprague, DO;  Location: [facility];  Service: General;  Laterality: N/A;   ROTATOR CUFF REPAIR Right 07/2011   as also bicep tendon surgery   SHOULDER ARTHROSCOPY WITH LABRAL REPAIR Right 01/01/2016   Procedure: SHOULDER ARTHROSCOPY WITH LABRAL (SLAP) REPAIR;  Surgeon: Meredith Pel, MD;  Location: [facility];  Service: Orthopedics;  Laterality: Right;   WISDOM TOOTH EXTRACTION     XI ROBOT ASSISTED DIAGNOSTIC LAPAROSCOPY N/A 12/28/2018   Procedure: XI ROBOT ASSISTED DIAGNOSTIC LAPAROSCOPY;  Surgeon: Benjamine Sprague, DO;  Location: [facility];  Service: General;  Laterality: N/A;   Social History:  reports that she quit smoking about 6 years ago. Her smoking use included cigarettes. She has never used smokeless tobacco. She reports current alcohol use. She reports that she does not use drugs.  Allergies  Allergen Reactions   No Known Allergies    Family History  Problem Relation Age of Onset   Hypertension Mother    Heart disease Father 87       MI; s/p CABG   Diabetes Father        resolved after weight loss   Cancer Paternal Grandmother        lung   Family history:  Family history reviewed and not pertinent  Prior to Admission medications   Medication Sig Start Date End Date Taking? Authorizing Provider  LINZESS 145 MCG CAPS capsule Take 145 mcg by mouth daily. 03/18/21  Yes [provider]  Multiple Vitamins-Minerals (MULTIVITAMIN WITH MINERALS) tablet Take 1 tablet by mouth daily.   Yes [provider]  ondansetron (ZOFRAN) 4 MG tablet Take 1 tablet (4 mg total) by mouth daily as needed for nausea or vomiting. 03/07/21 03/07/22 Yes Sakai, Isami, DO  amphetamine-dextroamphetamine (ADDERALL) 20 MG tablet Take 1 tablet (20 mg total) by mouth 2 (two) times daily. Patient not taking: Reported on 04/09/2021 04/16/20   Carlena Hurl, PA-C   Physical Exam: Vitals:   04/09/21 2100 04/09/21 2130 04/09/21 2200 04/09/21 2300  BP: 115/86 (!) 118/97 112/86 122/86  Pulse: 74 70 68 94  Resp: 11 12 11 13   Temp:      TempSrc:      SpO2: 97% 99% 97% 100%  Weight:      Height:       Constitutional: appears age-appropriate, NAD, calm, comfortable Eyes: PERRL, lids and conjunctivae normal ENMT: Mucous membranes are moist. Posterior pharynx clear  of any exudate or lesions. Age-appropriate dentition. Hearing appropriate Neck: normal, supple, no masses, no thyromegaly Respiratory: clear to auscultation bilaterally, no wheezing, no crackles. Normal respiratory effort. No accessory muscle use.  Cardiovascular: Regular rate and rhythm, no murmurs / rubs / gallops. No extremity edema. 2+ pedal pulses. No carotid bruits.  Abdomen: no tenderness, no masses palpated, no hepatosplenomegaly. Bowel sounds positive.  Musculoskeletal: no clubbing / cyanosis. No joint deformity upper and lower extremities. Good ROM, no contractures, no atrophy. Normal muscle tone.  Skin: no rashes, lesions, ulcers. No induration Neurologic: Sensation intact. Strength 5/5 in all 4.  Psychiatric: Normal judgment and insight. Alert and oriented x 3. Normal mood.   EKG: Not indicated  at this time  x-ray on Admission: I personally reviewed and I agree with radiologist reading as below.  DG Abdomen 1 View  Result Date: 04/09/2021 CLINICAL DATA:  Abdominal pain. EXAM: ABDOMEN - 1 VIEW COMPARISON:  Radiograph dated 05/02/2020. CT abdomen pelvis dated 08/05/2021. FINDINGS: No bowel dilatation or evidence of obstruction. No free air or radiopaque calculi. The osseous structures are intact. The soft tissues are unremarkable. IMPRESSION: Negative. Electronically Signed   By: Anner Crete M.D.   On: 04/09/2021 21:02   CT ABDOMEN PELVIS W CONTRAST  Result Date: 04/09/2021 CLINICAL DATA:  Vomiting, abdominal pain EXAM: CT ABDOMEN AND PELVIS WITH CONTRAST TECHNIQUE: Multidetector CT imaging of the abdomen and pelvis was performed using the standard protocol following bolus administration of intravenous contrast. RADIATION DOSE REDUCTION: This exam was performed according to the departmental dose-optimization program which includes automated exposure control, adjustment of the mA and/or kV according to patient size and/or use of iterative reconstruction technique. CONTRAST:  15m OMNIPAQUE IOHEXOL 300 MG/ML  SOLN COMPARISON:  04/09/2021, 03/07/2021 FINDINGS: Lower chest: No acute pleural or parenchymal lung disease. Hepatobiliary: No focal liver abnormality is seen. No gallstones, gallbladder wall thickening, or biliary dilatation. Pancreas: Unremarkable. No pancreatic ductal dilatation or surrounding inflammatory changes. Spleen: Normal in size without focal abnormality. Adrenals/Urinary Tract: Adrenal glands are unremarkable. Kidneys are normal, without renal calculi, focal lesion, or hydronephrosis. Bladder is decompressed, limiting its evaluation. Stomach/Bowel: Dilated fluid-filled small bowel loops are seen throughout the abdomen and pelvis, measuring up to 3.3 cm in diameter. Transition within the central mid abdomen near previous enterotomy staple lines, compatible with adhesions. Mild  fecal retention throughout the colon. No bowel wall thickening or inflammatory change. Vascular/Lymphatic: No significant vascular findings are present. No enlarged abdominal or pelvic lymph nodes. Reproductive: Uterus and bilateral adnexa are unremarkable. Other: Trace free fluid within the pelvis and the right upper quadrant. No free intraperitoneal gas. No abdominal wall hernia. Musculoskeletal: No acute or destructive bony lesions. Reconstructed images demonstrate no additional findings. IMPRESSION: 1. Small-bowel obstruction, with transition in the central mid abdomen near prior enterotomy staple lines consistent with adhesions. 2. Trace free fluid within the pelvis and right upper quadrant, likely reactive. Electronically Signed   By: MRanda NgoM.D.   On: 04/09/2021 22:52    Labs on Admission: I have personally reviewed following labs CBC: Recent Labs  Lab 04/09/21 2025  WBC 20.3*  HGB 17.4*  HCT 50.4*  MCV 83.6  PLT 4563   Basic Metabolic Panel: Recent Labs  Lab 04/09/21 2022 04/09/21 2025  NA  --  136  K  --  2.9*  CL  --  94*  CO2  --  23  GLUCOSE  --  160*  BUN  --  18  CREATININE  --  1.35*  CALCIUM  --  12.3*  MG 2.4  --    GFR: Estimated Creatinine Clearance: 65.3 mL/min (A) (by C-G formula based on SCr of 1.35 mg/dL (H)).  Liver Function Tests: Recent Labs  Lab 04/09/21 2025  AST 24  ALT 20  ALKPHOS 82  BILITOT 0.5  PROT 10.0*  ALBUMIN 5.3*   Recent Labs  Lab 04/09/21 2025  LIPASE 45   Urine analysis:    Component Value Date/Time   COLORURINE YELLOW (A) 04/28/2020 0836   APPEARANCEUR CLEAR (A) 04/28/2020 0836   LABSPEC 1.027 04/28/2020 0836   PHURINE 6.0 04/28/2020 0836   GLUCOSEU NEGATIVE 04/28/2020 0836   HGBUR NEGATIVE 04/28/2020 0836   BILIRUBINUR NEGATIVE 04/28/2020 0836   BILIRUBINUR neg 09/06/2012 1355   KETONESUR 5 (A) 04/28/2020 0836   PROTEINUR NEGATIVE 04/28/2020 0836   UROBILINOGEN negative 09/06/2012 1355   NITRITE NEGATIVE  04/28/2020 0836   LEUKOCYTESUR NEGATIVE 04/28/2020 0836   Dr. Tobie Poet [facility]  If 7PM-7AM, please contact overnight-coverage provider If 7AM-7PM, please contact day coverage provider [email or web address]  04/10/2021, 12:45 AM

## 2021-04-10 ENCOUNTER — Inpatient Hospital Stay: Payer: 59

## 2021-04-10 ENCOUNTER — Other Ambulatory Visit: Payer: Self-pay

## 2021-04-10 DIAGNOSIS — N179 Acute kidney failure, unspecified: Secondary | ICD-10-CM | POA: Diagnosis present

## 2021-04-10 DIAGNOSIS — Z28310 Unvaccinated for covid-19: Secondary | ICD-10-CM | POA: Diagnosis not present

## 2021-04-10 DIAGNOSIS — K565 Intestinal adhesions [bands], unspecified as to partial versus complete obstruction: Secondary | ICD-10-CM | POA: Diagnosis present

## 2021-04-10 DIAGNOSIS — Z20822 Contact with and (suspected) exposure to covid-19: Secondary | ICD-10-CM | POA: Diagnosis present

## 2021-04-10 DIAGNOSIS — D72829 Elevated white blood cell count, unspecified: Secondary | ICD-10-CM | POA: Diagnosis present

## 2021-04-10 DIAGNOSIS — Z79899 Other long term (current) drug therapy: Secondary | ICD-10-CM | POA: Diagnosis not present

## 2021-04-10 DIAGNOSIS — F909 Attention-deficit hyperactivity disorder, unspecified type: Secondary | ICD-10-CM | POA: Diagnosis present

## 2021-04-10 DIAGNOSIS — E876 Hypokalemia: Secondary | ICD-10-CM | POA: Diagnosis present

## 2021-04-10 DIAGNOSIS — K56609 Unspecified intestinal obstruction, unspecified as to partial versus complete obstruction: Secondary | ICD-10-CM | POA: Diagnosis present

## 2021-04-10 DIAGNOSIS — E860 Dehydration: Secondary | ICD-10-CM | POA: Diagnosis present

## 2021-04-10 DIAGNOSIS — Z87891 Personal history of nicotine dependence: Secondary | ICD-10-CM | POA: Diagnosis not present

## 2021-04-10 DIAGNOSIS — E872 Acidosis, unspecified: Secondary | ICD-10-CM | POA: Diagnosis present

## 2021-04-10 DIAGNOSIS — J45990 Exercise induced bronchospasm: Secondary | ICD-10-CM | POA: Diagnosis present

## 2021-04-10 LAB — CBC
HCT: 36 % (ref 36.0–46.0)
Hemoglobin: 12.7 g/dL (ref 12.0–15.0)
MCH: 29.4 pg (ref 26.0–34.0)
MCHC: 35.3 g/dL (ref 30.0–36.0)
MCV: 83.3 fL (ref 80.0–100.0)
Platelets: 280 10*3/uL (ref 150–400)
RBC: 4.32 MIL/uL (ref 3.87–5.11)
RDW: 12.6 % (ref 11.5–15.5)
WBC: 8.7 10*3/uL (ref 4.0–10.5)
nRBC: 0 % (ref 0.0–0.2)

## 2021-04-10 LAB — URINALYSIS, ROUTINE W REFLEX MICROSCOPIC
Bilirubin Urine: NEGATIVE
Glucose, UA: NEGATIVE mg/dL
Ketones, ur: 20 mg/dL — AB
Leukocytes,Ua: NEGATIVE
Nitrite: NEGATIVE
Protein, ur: NEGATIVE mg/dL
RBC / HPF: >50 RBC/hpf — ABNORMAL HIGH (ref 0–5)
Specific Gravity, Urine: >1.046 — ABNORMAL HIGH (ref 1.005–1.030)
pH: 5 (ref 5.0–8.0)

## 2021-04-10 LAB — BASIC METABOLIC PANEL
Anion gap: 7 (ref 5–15)
BUN: 15 mg/dL (ref 6–20)
CO2: 23 mmol/L (ref 22–32)
Calcium: 8.8 mg/dL — ABNORMAL LOW (ref 8.9–10.3)
Chloride: 107 mmol/L (ref 98–111)
Creatinine, Ser: 0.8 mg/dL (ref 0.44–1.00)
GFR, Estimated: >60 mL/min (ref >60)
Glucose, Bld: 84 mg/dL (ref 70–99)
Potassium: 4.4 mmol/L (ref 3.5–5.1)
Sodium: 137 mmol/L (ref 135–145)

## 2021-04-10 LAB — LACTIC ACID, PLASMA: Lactic Acid, Venous: 1.1 mmol/L (ref 0.5–1.9)

## 2021-04-10 LAB — MAGNESIUM: Magnesium: 2.4 mg/dL (ref 1.7–2.4)

## 2021-04-10 LAB — PREGNANCY, URINE: Preg Test, Ur: NEGATIVE

## 2021-04-10 MED ORDER — SODIUM CHLORIDE 0.9 % IV SOLN: INTRAVENOUS | Status: DC

## 2021-04-10 MED ORDER — POTASSIUM CHLORIDE 10 MEQ/100ML IV SOLN
10.0000 meq | INTRAVENOUS | Status: AC
  Administered 2021-04-10 (×2): 10 meq via INTRAVENOUS
  Filled 2021-04-10 (×2): qty 100

## 2021-04-10 MED ORDER — LORAZEPAM 2 MG/ML IJ SOLN: 2.0000 mg | INTRAMUSCULAR | Status: DC | PRN

## 2021-04-10 MED ORDER — HYDROCODONE-ACETAMINOPHEN 5-325 MG PO TABS
1.0000 | ORAL_TABLET | ORAL | Status: DC | PRN
  Administered 2021-04-10: 1 via ORAL
  Administered 2021-04-10 – 2021-04-12 (×7): 2 via ORAL
  Administered 2021-04-12: 1 via ORAL
  Filled 2021-04-10: qty 2
  Filled 2021-04-10 (×2): qty 1
  Filled 2021-04-10 (×6): qty 2

## 2021-04-10 MED ORDER — SODIUM CHLORIDE 0.9 % IV SOLN
12.5000 mg | Freq: Three times a day (TID) | INTRAVENOUS | Status: DC | PRN
  Filled 2021-04-10 (×2): qty 0.5

## 2021-04-10 MED ORDER — MORPHINE SULFATE (PF) 2 MG/ML IV SOLN
2.0000 mg | INTRAVENOUS | Status: DC | PRN
  Administered 2021-04-10 – 2021-04-12 (×8): 2 mg via INTRAVENOUS
  Filled 2021-04-10 (×8): qty 1

## 2021-04-10 MED ORDER — METRONIDAZOLE 500 MG/100ML IV SOLN
500.0000 mg | Freq: Two times a day (BID) | INTRAVENOUS | Status: DC
  Administered 2021-04-10: 500 mg via INTRAVENOUS
  Filled 2021-04-10 (×2): qty 100

## 2021-04-10 MED ORDER — HYDROMORPHONE HCL 1 MG/ML IJ SOLN
1.0000 mg | INTRAMUSCULAR | Status: DC | PRN
  Administered 2021-04-10 (×2): 1 mg via INTRAVENOUS
  Filled 2021-04-10 (×2): qty 1

## 2021-04-10 MED ORDER — ALBUTEROL SULFATE (2.5 MG/3ML) 0.083% IN NEBU: 2.5000 mg | INHALATION_SOLUTION | RESPIRATORY_TRACT | Status: DC | PRN

## 2021-04-10 MED ORDER — SODIUM CHLORIDE 0.9 % IV SOLN
2.0000 g | INTRAVENOUS | Status: DC
  Filled 2021-04-10: qty 20

## 2021-04-10 MED ORDER — DIATRIZOATE MEGLUMINE & SODIUM 66-10 % PO SOLN
90.0000 mL | Freq: Once | ORAL | Status: AC
  Administered 2021-04-10: 90 mL via ORAL

## 2021-04-10 NOTE — Progress Notes (Signed)
PROGRESS NOTE    Paula Cooley  JGO:[id number] DOB: 01/24/1991 DOA: 04/09/2021 PCP: Pcp, No  219A/219A-AA   Assessment & Plan:   Principal Problem:   Small bowel obstruction ([place]) Active Problems:   Chronic asthma   Exercise-induced asthma   Abdominal pain   Perforated Meckel's diverticulum   Intractable vomiting   SBO (small bowel obstruction) (HCC)   Leukocytosis   Paula Cooley is a 31 year old female with history of perforated Meckel's diverticulum, history of small bowel obstruction, ADHD, who presents emergency department for chief concerns of abdominal pain.   N/V and abdominal pain 2/2 Small bowel obstruction (HCC) --symptoms improved since presentation.  Currently no absolute need for NG tube, and pt prefers not to have it. --GenSurg consulted, with Dr. Lysle Pearl Plan: --SBFT today, per surgery --NPO for now --cont MIVF@50  while NPO   Lactic acidosis --resolved with IVF  Leukocytosis --started on Metronidazole and ceftriaxone on presentation. --likely reactive, as pt does not appear septic Plan: --d/c abx  AKI, POA --Cr 1.35 on presentation.  Likely due to N/V and dehydration.  Improved next day to 0.8 with IVF Plan: --cont MIVF@50  while NPO  Hypokalemia, POA --likely due to N/V --monitor and replete PRN    DVT prophylaxis: SCD/Compression stockings Code Status: Full code  Family Communication: mother updated at bedside today  Level of care: Med-Surg Dispo:   The patient is from: home Anticipated d/c is to: home Anticipated d/c date is: undetermined Patient currently is not medically ready to d/c due to: SBO, NPO   Subjective and Interval History:  N/V improved, though pt had been kept NPO.  Not passing gas.     Objective: Vitals:   04/10/21 0110 04/10/21 0608 04/10/21 0738 04/10/21 1527  BP:  115/75 118/79 111/60  Pulse:  82 88 68  Resp:  16 16 16   Temp:  98.2 F (36.8 C) 98.5 F (36.9 C) 98.3 F (36.8 C)  TempSrc:      SpO2:   100% 99% 100%  Weight: 80.2 kg     Height: 5' 6"  (1.676 m)       Intake/Output Summary (Last 24 hours) at 04/10/2021 1855 Last data filed at 04/10/2021 1600 Gross per 24 hour  Intake 3601.86 ml  Output --  Net 3601.86 ml   Filed Weights   04/09/21 1843 04/10/21 0110  Weight: 79.4 kg 80.2 kg    Examination:   Constitutional: NAD, AAOx3 HEENT: conjunctivae and lids normal, EOMI CV: No cyanosis.   RESP: normal respiratory effort, on RA Neuro: II - XII grossly intact.   Psych: Normal mood and affect.  Appropriate judgement and reason   Data Reviewed: I have personally reviewed following labs and imaging studies  CBC: Recent Labs  Lab 04/09/21 2025 04/10/21 0501  WBC 20.3* 8.7  HGB 17.4* 12.7  HCT 50.4* 36.0  MCV 83.6 83.3  PLT 426* 559   Basic Metabolic Panel: Recent Labs  Lab 04/09/21 2022 04/09/21 2025 04/10/21 0501  NA  --  136 137  K  --  2.9* 4.4  CL  --  94* 107  CO2  --  23 23  GLUCOSE  --  160* 84  BUN  --  18 15  CREATININE  --  1.35* 0.80  CALCIUM  --  12.3* 8.8*  MG 2.4  --   --    GFR: Estimated Creatinine Clearance: 110.9 mL/min (by C-G formula based on SCr of 0.8 mg/dL). Liver Function Tests: Recent Labs  Lab  04/09/21 2025  AST 24  ALT 20  ALKPHOS 82  BILITOT 0.5  PROT 10.0*  ALBUMIN 5.3*   Recent Labs  Lab 04/09/21 2025  LIPASE 45   No results for input(s): AMMONIA in the last 168 hours. Coagulation Profile: No results for input(s): INR, PROTIME in the last 168 hours. Cardiac Enzymes: No results for input(s): CKTOTAL, CKMB, CKMBINDEX, TROPONINI in the last 168 hours. BNP (last 3 results) No results for input(s): PROBNP in the last 8760 hours. HbA1C: No results for input(s): HGBA1C in the last 72 hours. CBG: No results for input(s): GLUCAP in the last 168 hours. Lipid Profile: No results for input(s): CHOL, HDL, LDLCALC, TRIG, CHOLHDL, LDLDIRECT in the last 72 hours. Thyroid Function Tests: No results for input(s): TSH,  T4TOTAL, FREET4, T3FREE, THYROIDAB in the last 72 hours. Anemia Panel: No results for input(s): VITAMINB12, FOLATE, FERRITIN, TIBC, IRON, RETICCTPCT in the last 72 hours. Sepsis Labs: Recent Labs  Lab 04/09/21 2114 04/10/21 0058  LATICACIDVEN 2.6* 1.1    Recent Results (from the past 240 hour(s))  Culture, blood (single)     Status: None (Preliminary result)   Collection Time: 04/09/21 10:46 PM   Specimen: BLOOD  Result Value Ref Range Status   Specimen Description BLOOD LEFT FOREARM  Final   Special Requests   Final    BOTTLES DRAWN AEROBIC AND ANAEROBIC Blood Culture results may not be optimal due to an excessive volume of blood received in culture bottles   Culture   Final    NO GROWTH < 12 HOURS Performed at [facility], [address], [place], [place] [address]    Report Status PENDING  Incomplete  Resp Panel by RT-PCR (Flu A&B, Covid) Nasopharyngeal Swab     Status: None   Collection Time: 04/09/21 10:46 PM   Specimen: Nasopharyngeal Swab; Nasopharyngeal(NP) swabs in vial transport medium  Result Value Ref Range Status   SARS Coronavirus 2 by RT PCR NEGATIVE NEGATIVE Final    Comment: (NOTE) SARS-CoV-2 target nucleic acids are NOT DETECTED.  The SARS-CoV-2 RNA is generally detectable in upper respiratory specimens during the acute phase of infection. The lowest concentration of SARS-CoV-2 viral copies this assay can detect is 138 copies/mL. A negative result does not preclude SARS-Cov-2 infection and should not be used as the sole basis for treatment or other patient management decisions. A negative result may occur with  improper specimen collection/handling, submission of specimen other than nasopharyngeal swab, presence of viral mutation(s) within the areas targeted by this assay, and inadequate number of viral copies(<138 copies/mL). A negative result must be combined with clinical observations, patient history, and epidemiological information. The  expected result is Negative.  Fact Sheet for Patients:  EntrepreneurPulse.com.au  Fact Sheet for Healthcare Providers:  IncredibleEmployment.be  This test is no t yet approved or cleared by the [place] FDA and  has been authorized for detection and/or diagnosis of SARS-CoV-2 by FDA under an Emergency Use Authorization (EUA). This EUA will remain  in effect (meaning this test can be used) for the duration of the COVID-19 declaration under Section 564(b)(1) of the Act, 21 U.S.C.section 360bbb-3(b)(1), unless the authorization is terminated  or revoked sooner.       Influenza A by PCR NEGATIVE NEGATIVE Final   Influenza B by PCR NEGATIVE NEGATIVE Final    Comment: (NOTE) The Xpert Xpress SARS-CoV-2/FLU/RSV plus assay is intended as an aid in the diagnosis of influenza from Nasopharyngeal swab specimens and should not be used  as a sole basis for treatment. Nasal washings and aspirates are unacceptable for Xpert Xpress SARS-CoV-2/FLU/RSV testing.  Fact Sheet for Patients: EntrepreneurPulse.com.au  Fact Sheet for Healthcare Providers: IncredibleEmployment.be  This test is not yet approved or cleared by the [place] FDA and has been authorized for detection and/or diagnosis of SARS-CoV-2 by FDA under an Emergency Use Authorization (EUA). This EUA will remain in effect (meaning this test can be used) for the duration of the COVID-19 declaration under Section 564(b)(1) of the Act, 21 U.S.C. section 360bbb-3(b)(1), unless the authorization is terminated or revoked.  Performed at [facility], [address] [place], [place] [address]   Culture, blood (single) w Reflex to ID Panel     Status: None (Preliminary result)   Collection Time: 04/10/21 12:58 AM   Specimen: BLOOD  Result Value Ref Range Status   Specimen Description BLOOD RIGHT HAND  Final   Special Requests   Final    BOTTLES  DRAWN AEROBIC AND ANAEROBIC Blood Culture adequate volume   Culture   Final    NO GROWTH < 12 HOURS Performed at [facility], [address] [place], [place] [address]    Report Status PENDING  Incomplete      Radiology Studies: DG Abdomen 1 View  Result Date: 04/09/2021 CLINICAL DATA:  Abdominal pain. EXAM: ABDOMEN - 1 VIEW COMPARISON:  Radiograph dated 05/02/2020. CT abdomen pelvis dated 08/05/2021. FINDINGS: No bowel dilatation or evidence of obstruction. No free air or radiopaque calculi. The osseous structures are intact. The soft tissues are unremarkable. IMPRESSION: Negative. Electronically Signed   By: Anner Crete M.D.   On: 04/09/2021 21:02   CT ABDOMEN PELVIS W CONTRAST  Result Date: 04/09/2021 CLINICAL DATA:  Vomiting, abdominal pain EXAM: CT ABDOMEN AND PELVIS WITH CONTRAST TECHNIQUE: Multidetector CT imaging of the abdomen and pelvis was performed using the standard protocol following bolus administration of intravenous contrast. RADIATION DOSE REDUCTION: This exam was performed according to the departmental dose-optimization program which includes automated exposure control, adjustment of the mA and/or kV according to patient size and/or use of iterative reconstruction technique. CONTRAST:  165m OMNIPAQUE IOHEXOL 300 MG/ML  SOLN COMPARISON:  04/09/2021, 03/07/2021 FINDINGS: Lower chest: No acute pleural or parenchymal lung disease. Hepatobiliary: No focal liver abnormality is seen. No gallstones, gallbladder wall thickening, or biliary dilatation. Pancreas: Unremarkable. No pancreatic ductal dilatation or surrounding inflammatory changes. Spleen: Normal in size without focal abnormality. Adrenals/Urinary Tract: Adrenal glands are unremarkable. Kidneys are normal, without renal calculi, focal lesion, or hydronephrosis. Bladder is decompressed, limiting its evaluation. Stomach/Bowel: Dilated fluid-filled small bowel loops are seen throughout the abdomen and pelvis,  measuring up to 3.3 cm in diameter. Transition within the central mid abdomen near previous enterotomy staple lines, compatible with adhesions. Mild fecal retention throughout the colon. No bowel wall thickening or inflammatory change. Vascular/Lymphatic: No significant vascular findings are present. No enlarged abdominal or pelvic lymph nodes. Reproductive: Uterus and bilateral adnexa are unremarkable. Other: Trace free fluid within the pelvis and the right upper quadrant. No free intraperitoneal gas. No abdominal wall hernia. Musculoskeletal: No acute or destructive bony lesions. Reconstructed images demonstrate no additional findings. IMPRESSION: 1. Small-bowel obstruction, with transition in the central mid abdomen near prior enterotomy staple lines consistent with adhesions. 2. Trace free fluid within the pelvis and right upper quadrant, likely reactive. Electronically Signed   By: MRanda NgoM.D.   On: 04/09/2021 22:52     Scheduled Meds: Continuous Infusions:  cefTRIAXone (ROCEPHIN)  IV     metronidazole Stopped (04/10/21 1135)   promethazine (PHENERGAN) injection (IM or IVPB)       LOS: 0 days     Enzo Bi, MD [facility] If 7PM-7AM, please contact night-coverage 04/10/2021, 6:55 PM

## 2021-04-10 NOTE — Assessment & Plan Note (Addendum)
-   Symptomatic support ?- Patient appears stable and comfortable at this time ?- She denies any nausea ?- Continue supportive measures with as needed ondansetron ?- Morphine 2 mg IV every 4 hours as needed for moderate pain; Dilaudid 1 mg IV every 4 hours as needed for severe pain ?- I discussed extensively with patient that the goal is to reduce her pain by 50% and not to completely take the pain away ?- Increase use of opioid does increase his patient's risk of worsening obstruction ?- EDP has consulted general surgery, Dr. Peyton Najjar ?

## 2021-04-10 NOTE — TOC Initial Note (Signed)
Transition of Care (TOC) - Initial/Assessment Note  ? ? ?Patient Details  ?Name: Paula Cooley ?MRN: [id number] ?Date of Birth: 04/28/1990 ? ?Transition of Care (TOC) CM/SW Contact:    ?Beverly Sessions, RN ?Phone Number: ?04/10/2021, 1:45 PM ? ?Clinical Narrative:                 ? ?Transition of Care (TOC) Screening Note ? ? ?Patient Details  ?Name: Paula Cooley ?Date of Birth: [birth year] ? ? ?Transition of Care (TOC) CM/SW Contact:    ?Beverly Sessions, RN ?Phone Number: ?04/10/2021, 1:46 PM ? ? ? ?Transition of Care Department [facility]) has reviewed patient and no TOC needs have been identified at this time. We will continue to monitor patient advancement through interdisciplinary progression rounds. If new patient transition needs arise, please place a TOC consult. ? ? ? ?  ?  ? ? ?Patient Goals and CMS Choice ?  ?  ?  ? ?Expected Discharge Plan and Services ?  ?  ?  ?  ?  ?                ?  ?  ?  ?  ?  ?  ?  ?  ?  ?  ? ?Prior Living Arrangements/Services ?  ?  ?  ?       ?  ?  ?  ?  ? ?Activities of Daily Living ?Home Assistive Devices/Equipment: None ?ADL Screening (condition at time of admission) ?Patient's cognitive ability adequate to safely complete daily activities?: Yes ?Is the patient deaf or have difficulty hearing?: No ?Does the patient have difficulty seeing, even when wearing glasses/contacts?: No ?Does the patient have difficulty concentrating, remembering, or making decisions?: No ?Patient able to express need for assistance with ADLs?: Yes ?Does the patient have difficulty dressing or bathing?: No ?Independently performs ADLs?: Yes (appropriate for developmental age) ?Does the patient have difficulty walking or climbing stairs?: No ?Weakness of Legs: Both ?Weakness of Arms/Hands: None ? ?Permission Sought/Granted ?  ?  ?   ?   ?   ?   ? ?Emotional Assessment ?  ?  ?  ?  ?  ?  ? ?Admission diagnosis:  Small bowel obstruction ([place]) [K56.609] ?SBO (small bowel obstruction) ([place]) [K56.609] ?Patient Active  Problem List  ? Diagnosis Date Noted  ? Leukocytosis 04/10/2021  ? Small bowel obstruction ([place]) 04/09/2021  ? SBO (small bowel obstruction) ([place]) 04/28/2020  ? Perforated Meckel's diverticulum   ? Intractable pain   ? Intractable vomiting   ? Abdominal pain 12/28/2018  ? Labral tear of shoulder, degenerative, right 04/02/2016  ? Chronic asthma 07/13/2013  ? Exercise-induced asthma 07/13/2013  ? Allergic rhinitis, cause unspecified 06/30/2011  ? ?PCP:  Pcp, No ?Pharmacy:   ?[id number] [id number]?Phone: [phone]: [phone]? ?[id number] [id number]: [phone]: [phone] ? ? ? ?Social Determinants of Health (SDOH) Interventions ?  ? ?Readmission Risk Interventions ?No flowsheet data found. ? ? ?

## 2021-04-10 NOTE — Assessment & Plan Note (Signed)
-   Presumed secondary to small bowel obstruction ?

## 2021-04-10 NOTE — Progress Notes (Signed)
Sepsis tracking by [facility]

## 2021-04-10 NOTE — Assessment & Plan Note (Addendum)
-   Ondansetron, p.o. and IV ordered ?- Phenergan 12.5 mg IV every 8 hours as needed for refractory nausea vomiting, there is a Producer, television/film/video of Phenergan, I have ordered to as needed dose ?

## 2021-04-10 NOTE — Consult Note (Signed)
Subjective:  ? ?CC: SBO ? ?HPI: ? Paula Cooley is a 31 y.o. female who was consulted by [facility] for issue above.  Symptoms were first noted 2 days ago. Pain is sharp, confined to the periumbilical area, without radiation.  Associated with N/V, exacerbated by nothing specific. ? ?Hx of similar episodes in the past, with one episode showing SBO likely from adhesions, other showing possible enteritis.   ?  ?Past Medical History:  has a past medical history of Acetabular labrum tear, Acne, Asthma, Hypoglycemia, and Seasonal allergies. ? ?Past Surgical History:  ?Past Surgical History:  ?Procedure Laterality Date  ? LAPAROSCOPY N/A 12/31/2018  ? Procedure: LAPAROSCOPY DIAGNOSTIC;  Surgeon: Benjamine Sprague, DO;  Location: [facility];  Service: General;  Laterality: N/A;  ? LAPAROTOMY N/A 12/31/2018  ? Procedure: EXPLORATORY LAPAROTOMY WITH REDUCTION OF HERNIA;  Surgeon: Benjamine Sprague, DO;  Location: [facility];  Service: General;  Laterality: N/A;  ? ROTATOR CUFF REPAIR Right 07/2011  ? as also bicep tendon surgery  ? SHOULDER ARTHROSCOPY WITH LABRAL REPAIR Right 01/01/2016  ? Procedure: SHOULDER ARTHROSCOPY WITH LABRAL (SLAP) REPAIR;  Surgeon: Meredith Pel, MD;  Location: [place];  Service: Orthopedics;  Laterality: Right;  ? WISDOM TOOTH EXTRACTION    ? XI ROBOT ASSISTED DIAGNOSTIC LAPAROSCOPY N/A 12/28/2018  ? Procedure: XI ROBOT ASSISTED DIAGNOSTIC LAPAROSCOPY;  Surgeon: Benjamine Sprague, DO;  Location: [facility];  Service: General;  Laterality: N/A;  ? ? ?Family History: family history includes Cancer in her paternal grandmother; Diabetes in her father; Heart disease (age of onset: 31) in her father; Hypertension in her mother. ? ?Social History:  reports that she quit smoking about 6 years ago. Her smoking use included cigarettes. She has never used smokeless tobacco. She reports current alcohol use. She reports that she does not use drugs. ? ?Current Medications:  ?Prior to Admission medications   ?Medication Sig Start Date End  Date Taking? Authorizing Provider  ?LINZESS 145 MCG CAPS capsule Take 145 mcg by mouth daily. 03/18/21  Yes [provider]  ?Multiple Vitamins-Minerals (MULTIVITAMIN WITH MINERALS) tablet Take 1 tablet by mouth daily.   Yes [provider]  ?ondansetron (ZOFRAN) 4 MG tablet Take 1 tablet (4 mg total) by mouth daily as needed for nausea or vomiting. 03/07/21 03/07/22 Yes Ayauna Mcnay, DO  ?amphetamine-dextroamphetamine (ADDERALL) 20 MG tablet Take 1 tablet (20 mg total) by mouth 2 (two) times daily. ?Patient not taking: Reported on 04/09/2021 04/16/20   Carlena Hurl, PA-C  ? ? ?Allergies:  ?Allergies as of 04/09/2021 - Review Complete 04/09/2021  ?Allergen Reaction Noted  ? No known allergies  12/31/2015  ? ? ?ROS:  ?General: Denies weight loss, weight gain, fatigue, fevers, chills, and night sweats. ?Eyes: Denies blurry vision, double vision, eye pain, itchy eyes, and tearing. ?Ears: Denies hearing loss, earache, and ringing in ears. ?Nose: Denies sinus pain, congestion, infections, runny nose, and nosebleeds. ?Mouth/throat: Denies hoarseness, sore throat, bleeding gums, and difficulty swallowing. ?Heart: Denies chest pain, palpitations, racing heart, irregular heartbeat, leg pain or swelling, and decreased activity tolerance. ?Respiratory: Denies breathing difficulty, shortness of breath, wheezing, cough, and sputum. ?GI: Denies change in appetite, heartburn,  and blood in stool. ?GU: Denies difficulty urinating, pain with urinating, urgency, frequency, blood in urine. ?Musculoskeletal: Denies joint stiffness, pain, swelling, muscle weakness. ?Skin: Denies rash, itching, mass, tumors, sores, and boils ?Neurologic: Denies headache, fainting, dizziness, seizures, numbness, and tingling. ?Psychiatric: Denies depression, anxiety, difficulty sleeping, and memory loss. ?Endocrine: Denies heat or  cold intolerance, and increased thirst or urination. ?Blood/lymph: Denies easy bruising, easy bruising, and  swollen glands ? ? ?  ?Objective:  ?  ? ?BP 118/79 (BP Location: Right Arm)   Pulse 88   Temp 98.5 ?F (36.9 ?C)   Resp 16   Ht 5' 6"  (1.676 m)   Wt 80.2 kg   LMP 04/08/2021   SpO2 99%   BMI 28.54 kg/m?  ? ?Constitutional :  alert, cooperative, appears stated age, and no distress  ?Lymphatics/Throat:  no asymmetry, masses, or scars  ?Respiratory:  clear to auscultation bilaterally  ?Cardiovascular:  regular rate and rhythm  ?Gastrointestinal: Soft, focal TTP RLQ and periumbilical region but no distention, no guarding .   ?Musculoskeletal: Steady movement  ?Skin: Cool and moist, midline and laparoscopic surgical scars   ?Psychiatric: Normal affect, non-agitated, not confused  ?   ?  ?LABS:  ?CMP Latest Ref Rng & Units 04/10/2021 04/09/2021 05/01/2020  ?Glucose 70 - 99 mg/dL 84 160(H) 86  ?BUN 6 - 20 mg/dL 15 18 13   ?Creatinine 0.44 - 1.00 mg/dL 0.80 1.35(H) 0.79  ?Sodium 135 - 145 mmol/L 137 136 141  ?Potassium 3.5 - 5.1 mmol/L 4.4 2.9(L) 3.7  ?Chloride 98 - 111 mmol/L 107 94(L) 108  ?CO2 22 - 32 mmol/L 23 23 25   ?Calcium 8.9 - 10.3 mg/dL 8.8(L) 12.3(H) 8.7(L)  ?Total Protein 6.5 - 8.1 g/dL - 10.0(H) -  ?Total Bilirubin 0.3 - 1.2 mg/dL - 0.5 -  ?Alkaline Phos 38 - 126 U/L - 82 -  ?AST 15 - 41 U/L - 24 -  ?ALT 0 - 44 U/L - 20 -  ? ?CBC Latest Ref Rng & Units 04/10/2021 04/09/2021 05/01/2020  ?WBC 4.0 - 10.5 K/uL 8.7 20.3(H) 6.1  ?Hemoglobin 12.0 - 15.0 g/dL 12.7 17.4(H) 12.4  ?Hematocrit 36.0 - 46.0 % 36.0 50.4(H) 36.7  ?Platelets 150 - 400 K/uL 280 426(H) 202  ? ? ?RADS: ?CLINICAL DATA:  Vomiting, abdominal pain ?  ?EXAM: ?CT ABDOMEN AND PELVIS WITH CONTRAST ?  ?TECHNIQUE: ?Multidetector CT imaging of the abdomen and pelvis was performed ?using the standard protocol following bolus administration of ?intravenous contrast. ?  ?RADIATION DOSE REDUCTION: This exam was performed according to the ?departmental dose-optimization program which includes automated ?exposure control, adjustment of the mA and/or kV according  to ?patient size and/or use of iterative reconstruction technique. ?  ?CONTRAST:  117m OMNIPAQUE IOHEXOL 300 MG/ML  SOLN ?  ?COMPARISON:  04/09/2021, 03/07/2021 ?  ?FINDINGS: ?Lower chest: No acute pleural or parenchymal lung disease. ?  ?Hepatobiliary: No focal liver abnormality is seen. No gallstones, ?gallbladder wall thickening, or biliary dilatation. ?  ?Pancreas: Unremarkable. No pancreatic ductal dilatation or ?surrounding inflammatory changes. ?  ?Spleen: Normal in size without focal abnormality. ?  ?Adrenals/Urinary Tract: Adrenal glands are unremarkable. Kidneys are ?normal, without renal calculi, focal lesion, or hydronephrosis. ?Bladder is decompressed, limiting its evaluation. ?  ?Stomach/Bowel: Dilated fluid-filled small bowel loops are seen ?throughout the abdomen and pelvis, measuring up to 3.3 cm in ?diameter. Transition within the central mid abdomen near previous ?enterotomy staple lines, compatible with adhesions. Mild fecal ?retention throughout the colon. No bowel wall thickening or ?inflammatory change. ?  ?Vascular/Lymphatic: No significant vascular findings are present. No ?enlarged abdominal or pelvic lymph nodes. ?  ?Reproductive: Uterus and bilateral adnexa are unremarkable. ?  ?Other: Trace free fluid within the pelvis and the right upper ?quadrant. No free intraperitoneal gas. No abdominal wall hernia. ?  ?Musculoskeletal: No acute  or destructive bony lesions. Reconstructed ?images demonstrate no additional findings. ?  ?IMPRESSION: ?1. Small-bowel obstruction, with transition in the central mid ?abdomen near prior enterotomy staple lines consistent with ?adhesions. ?2. Trace free fluid within the pelvis and right upper quadrant, ?likely reactive. ?  ?  ?Electronically Signed ?  By: Randa Ngo M.D. ?  On: 04/09/2021 22:52 ?  ?Assessment:  ? ?SBO, based on CT read ? ?Plan:  ? ?Continue IVF, pain and nausea management.  Will order SBFT to see if persistent obstruction.  May need to  consider surgical exploration since these kind of episodes are becoming more frequent ? ? ? ?

## 2021-04-10 NOTE — Progress Notes (Signed)
Notified bedside nurse of need to draw repeat lactic acid. 

## 2021-04-10 NOTE — Assessment & Plan Note (Addendum)
-   With elevated lactic acid ?- Metronidazole and ceftriaxone IV ordered ?- Single blood culture ordered by EDP, additional single blood culture ordered to total 2 ?- I suspect this is reactive as patient does not appear septic at this time ?

## 2021-04-10 NOTE — Assessment & Plan Note (Signed)
-   Albuterol nebulizer every 4 hours as needed for shortness of breath and wheezing ?

## 2021-04-11 LAB — BASIC METABOLIC PANEL
Anion gap: 13 (ref 5–15)
BUN: 10 mg/dL (ref 6–20)
CO2: 22 mmol/L (ref 22–32)
Calcium: 9.1 mg/dL (ref 8.9–10.3)
Chloride: 104 mmol/L (ref 98–111)
Creatinine, Ser: 0.77 mg/dL (ref 0.44–1.00)
GFR, Estimated: >60 mL/min (ref >60)
Glucose, Bld: 62 mg/dL — ABNORMAL LOW (ref 70–99)
Potassium: 3.9 mmol/L (ref 3.5–5.1)
Sodium: 139 mmol/L (ref 135–145)

## 2021-04-11 LAB — CBC
HCT: 40.7 % (ref 36.0–46.0)
Hemoglobin: 13.4 g/dL (ref 12.0–15.0)
MCH: 29.1 pg (ref 26.0–34.0)
MCHC: 32.9 g/dL (ref 30.0–36.0)
MCV: 88.3 fL (ref 80.0–100.0)
Platelets: 257 10*3/uL (ref 150–400)
RBC: 4.61 MIL/uL (ref 3.87–5.11)
RDW: 12.5 % (ref 11.5–15.5)
WBC: 7.2 10*3/uL (ref 4.0–10.5)
nRBC: 0 % (ref 0.0–0.2)

## 2021-04-11 LAB — MAGNESIUM: Magnesium: 2 mg/dL (ref 1.7–2.4)

## 2021-04-11 MED ORDER — DEXTROSE-NACL 5-0.9 % IV SOLN: INTRAVENOUS | Status: DC

## 2021-04-11 NOTE — Progress Notes (Signed)
Subjective:  ?CC: [name] is a 31 y.o. female  Hospital stay day 1,   SBO ? ?HPI: ?BM overnight.  Pain improved ? ?ROS:  ?General: Denies weight loss, weight gain, fatigue, fevers, chills, and night sweats. ?Heart: Denies chest pain, palpitations, racing heart, irregular heartbeat, leg pain or swelling, and decreased activity tolerance. ?Respiratory: Denies breathing difficulty, shortness of breath, wheezing, cough, and sputum. ?GI: Denies change in appetite, heartburn, nausea, vomiting, constipation, diarrhea, and blood in stool. ?GU: Denies difficulty urinating, pain with urinating, urgency, frequency, blood in urine. ? ? ?Objective:  ? ?Temp:  [98.4 ?F (36.9 ?C)-98.8 ?F (37.1 ?C)] 98.5 ?F (36.9 ?C) (03/02 0736) ?Pulse Rate:  [76-84] 84 (03/02 0736) ?Resp:  [16-18] 18 (03/02 0736) ?BP: (93-104)/(58-73) 93/58 (03/02 0736) ?SpO2:  [98 %-100 %] 98 % (03/02 0736)     Height: 5' 6"  (167.6 cm) Weight: 80.2 kg BMI (Calculated): 28.55  ? ?Intake/Output this shift:  ? ?Intake/Output Summary (Last 24 hours) at 04/11/2021 1908 ?Last data filed at 04/11/2021 1735 ?Gross per 24 hour  ?Intake 1195.14 ml  ?Output --  ?Net 1195.14 ml  ? ? ?Constitutional :  alert, cooperative, appears stated age, and no distress  ?Respiratory:  clear to auscultation bilaterally  ?Cardiovascular:  regular rate and rhythm  ?Gastrointestinal: Soft, no guarding, focal TTP periumbilical still present .   ?Skin: Cool and moist.   ?Psychiatric: Normal affect, non-agitated, not confused  ?   ?  ?LABS:  ?CMP Latest Ref Rng & Units 04/11/2021 04/10/2021 04/09/2021  ?Glucose 70 - 99 mg/dL 62(L) 84 160(H)  ?BUN 6 - 20 mg/dL 10 15 18   ?Creatinine 0.44 - 1.00 mg/dL 0.77 0.80 1.35(H)  ?Sodium 135 - 145 mmol/L 139 137 136  ?Potassium 3.5 - 5.1 mmol/L 3.9 4.4 2.9(L)  ?Chloride 98 - 111 mmol/L 104 107 94(L)  ?CO2 22 - 32 mmol/L 22 23 23   ?Calcium 8.9 - 10.3 mg/dL 9.1 8.8(L) 12.3(H)  ?Total Protein 6.5 - 8.1 g/dL - - 10.0(H)  ?Total Bilirubin 0.3 - 1.2 mg/dL - - 0.5   ?Alkaline Phos 38 - 126 U/L - - 82  ?AST 15 - 41 U/L - - 24  ?ALT 0 - 44 U/L - - 20  ? ?CBC Latest Ref Rng & Units 04/11/2021 04/10/2021 04/09/2021  ?WBC 4.0 - 10.5 K/uL 7.2 8.7 20.3(H)  ?Hemoglobin 12.0 - 15.0 g/dL 13.4 12.7 17.4(H)  ?Hematocrit 36.0 - 46.0 % 40.7 36.0 50.4(H)  ?Platelets 150 - 400 K/uL 257 280 426(H)  ? ? ?RADS: ?SBFT- contrast noted on colon ?Assessment:  ? ?SBO- resolving.  Advance diet as tolerated ? ?labs/images/medications/previous chart entries reviewed personally and relevant changes/updates noted above. ? ? ?

## 2021-04-11 NOTE — Progress Notes (Addendum)
PROGRESS NOTE    Paula Cooley  ZOX:[id number] DOB: 04-03-1990 DOA: 04/09/2021 PCP: Pcp, No  [id number]/[id number]-AA   Assessment & Plan:   Principal Problem:   Small bowel obstruction ([place]) Active Problems:   Chronic asthma   Exercise-induced asthma   Abdominal pain   Perforated Meckel's diverticulum   Intractable vomiting   SBO (small bowel obstruction) (HCC)   Leukocytosis   [name] is a 31 year old female with history of perforated Meckel's diverticulum, history of small bowel obstruction, ADHD, who presents emergency department for chief concerns of abdominal pain.   N/V and abdominal pain 2/2 Small bowel obstruction (HCC) --symptoms improved since presentation.  Currently no absolute need for NG tube, and pt prefers not to have it. --GenSurg consulted, with Dr. Lysle Pearl --SBFT neg for obstruction on 3/1 Plan: --start clear liquid diet, advance as tolerated   Lactic acidosis --resolved with IVF  Leukocytosis --started on Metronidazole and ceftriaxone on presentation. --likely reactive, as pt does not appear septic.  Abx d/c'ed. --hold abx  AKI, POA, resolved --Cr 1.35 on presentation.  Likely due to N/V and dehydration.  Improved next day to 0.8 with IVF Plan: --oral hydration now  Hypokalemia, POA --likely due to N/V --monitor and replete PRN    DVT prophylaxis: SCD/Compression stockings Code Status: Full code  Family Communication: mother updated at bedside today  Level of care: Med-Surg Dispo:   The patient is from: home Anticipated d/c is to: home Anticipated d/c date is: 1-2 days Patient currently is not medically ready to d/c due to: need to advance diet   Subjective and Interval History:  SBFT was neg for obstruction yesterday.  Pt started on clear liquid diet for lunch, reported some crampy pain with liquid food going right through her.  Starting having BM's.     Objective: Vitals:   04/10/21 2036 04/11/21 0349 04/11/21 0736 04/11/21 2018   BP: 102/73 104/68 (!) 93/58 106/74  Pulse: 76 84 84 75  Resp: 16 16 18 17   Temp: 98.4 F (36.9 C) 98.8 F (37.1 C) 98.5 F (36.9 C) 98.8 F (37.1 C)  TempSrc: Oral Oral Oral   SpO2: 99% 100% 98% 99%  Weight:      Height:        Intake/Output Summary (Last 24 hours) at 04/11/2021 2107 Last data filed at 04/11/2021 1735 Gross per 24 hour  Intake 1195.14 ml  Output --  Net 1195.14 ml   Filed Weights   04/09/21 1843 04/10/21 0110  Weight: 79.4 kg 80.2 kg    Examination:   Constitutional: NAD, AAOx3 HEENT: conjunctivae and lids normal, EOMI CV: No cyanosis.   RESP: normal respiratory effort, on RA Neuro: II - XII grossly intact.   Psych: Normal mood and affect.  Appropriate judgement and reason   Data Reviewed: I have personally reviewed following labs and imaging studies  CBC: Recent Labs  Lab 04/09/21 2025 04/10/21 0501 04/11/21 0426  WBC 20.3* 8.7 7.2  HGB 17.4* 12.7 13.4  HCT 50.4* 36.0 40.7  MCV 83.6 83.3 88.3  PLT 426* 280 811   Basic Metabolic Panel: Recent Labs  Lab 04/09/21 2022 04/09/21 2025 04/10/21 0501 04/11/21 0426  NA  --  136 137 139  K  --  2.9* 4.4 3.9  CL  --  94* 107 104  CO2  --  23 23 22   GLUCOSE  --  160* 84 62*  BUN  --  18 15 10   CREATININE  --  1.35* 0.80  0.77  CALCIUM  --  12.3* 8.8* 9.1  MG 2.4  --   --  2.0   GFR: Estimated Creatinine Clearance: 110.9 mL/min (by C-G formula based on SCr of 0.77 mg/dL). Liver Function Tests: Recent Labs  Lab 04/09/21 2025  AST 24  ALT 20  ALKPHOS 82  BILITOT 0.5  PROT 10.0*  ALBUMIN 5.3*   Recent Labs  Lab 04/09/21 2025  LIPASE 45   No results for input(s): AMMONIA in the last 168 hours. Coagulation Profile: No results for input(s): INR, PROTIME in the last 168 hours. Cardiac Enzymes: No results for input(s): CKTOTAL, CKMB, CKMBINDEX, TROPONINI in the last 168 hours. BNP (last 3 results) No results for input(s): PROBNP in the last 8760 hours. HbA1C: No results for  input(s): HGBA1C in the last 72 hours. CBG: No results for input(s): GLUCAP in the last 168 hours. Lipid Profile: No results for input(s): CHOL, HDL, LDLCALC, TRIG, CHOLHDL, LDLDIRECT in the last 72 hours. Thyroid Function Tests: No results for input(s): TSH, T4TOTAL, FREET4, T3FREE, THYROIDAB in the last 72 hours. Anemia Panel: No results for input(s): VITAMINB12, FOLATE, FERRITIN, TIBC, IRON, RETICCTPCT in the last 72 hours. Sepsis Labs: Recent Labs  Lab 04/09/21 2114 04/10/21 0058  LATICACIDVEN 2.6* 1.1    Recent Results (from the past 240 hour(s))  Culture, blood (single)     Status: None (Preliminary result)   Collection Time: 04/09/21 10:46 PM   Specimen: BLOOD  Result Value Ref Range Status   Specimen Description BLOOD LEFT FOREARM  Final   Special Requests   Final    BOTTLES DRAWN AEROBIC AND ANAEROBIC Blood Culture results may not be optimal due to an excessive volume of blood received in culture bottles   Culture   Final    NO GROWTH 2 DAYS Performed at [facility], [address], [place], [place] [address]    Report Status PENDING  Incomplete  Resp Panel by RT-PCR (Flu A&B, Covid) Nasopharyngeal Swab     Status: None   Collection Time: 04/09/21 10:46 PM   Specimen: Nasopharyngeal Swab; Nasopharyngeal(NP) swabs in vial transport medium  Result Value Ref Range Status   SARS Coronavirus 2 by RT PCR NEGATIVE NEGATIVE Final    Comment: (NOTE) SARS-CoV-2 target nucleic acids are NOT DETECTED.  The SARS-CoV-2 RNA is generally detectable in upper respiratory specimens during the acute phase of infection. The lowest concentration of SARS-CoV-2 viral copies this assay can detect is 138 copies/mL. A negative result does not preclude SARS-Cov-2 infection and should not be used as the sole basis for treatment or other patient management decisions. A negative result may occur with  improper specimen collection/handling, submission of specimen other than  nasopharyngeal swab, presence of viral mutation(s) within the areas targeted by this assay, and inadequate number of viral copies(<138 copies/mL). A negative result must be combined with clinical observations, patient history, and epidemiological information. The expected result is Negative.  Fact Sheet for Patients:  EntrepreneurPulse.com.au  Fact Sheet for Healthcare Providers:  IncredibleEmployment.be  This test is no t yet approved or cleared by the [place] FDA and  has been authorized for detection and/or diagnosis of SARS-CoV-2 by FDA under an Emergency Use Authorization (EUA). This EUA will remain  in effect (meaning this test can be used) for the duration of the COVID-19 declaration under Section 564(b)(1) of the Act, 21 U.S.C.section 360bbb-3(b)(1), unless the authorization is terminated  or revoked sooner.       Influenza A by PCR  NEGATIVE NEGATIVE Final   Influenza B by PCR NEGATIVE NEGATIVE Final    Comment: (NOTE) The Xpert Xpress SARS-CoV-2/FLU/RSV plus assay is intended as an aid in the diagnosis of influenza from Nasopharyngeal swab specimens and should not be used as a sole basis for treatment. Nasal washings and aspirates are unacceptable for Xpert Xpress SARS-CoV-2/FLU/RSV testing.  Fact Sheet for Patients: EntrepreneurPulse.com.au  Fact Sheet for Healthcare Providers: IncredibleEmployment.be  This test is not yet approved or cleared by the [place] FDA and has been authorized for detection and/or diagnosis of SARS-CoV-2 by FDA under an Emergency Use Authorization (EUA). This EUA will remain in effect (meaning this test can be used) for the duration of the COVID-19 declaration under Section 564(b)(1) of the Act, 21 U.S.C. section 360bbb-3(b)(1), unless the authorization is terminated or revoked.  Performed at [facility], [address] [place], [place]  [address]   Culture, blood (single) w Reflex to ID Panel     Status: None (Preliminary result)   Collection Time: 04/10/21 12:58 AM   Specimen: BLOOD  Result Value Ref Range Status   Specimen Description BLOOD RIGHT HAND  Final   Special Requests   Final    BOTTLES DRAWN AEROBIC AND ANAEROBIC Blood Culture adequate volume   Culture   Final    NO GROWTH 1 DAY Performed at [facility], [address] [place],  [address]    Report Status PENDING  Incomplete      Radiology Studies: CT ABDOMEN PELVIS W CONTRAST  Result Date: 04/09/2021 CLINICAL DATA:  Vomiting, abdominal pain EXAM: CT ABDOMEN AND PELVIS WITH CONTRAST TECHNIQUE: Multidetector CT imaging of the abdomen and pelvis was performed using the standard protocol following bolus administration of intravenous contrast. RADIATION DOSE REDUCTION: This exam was performed according to the departmental dose-optimization program which includes automated exposure control, adjustment of the mA and/or kV according to patient size and/or use of iterative reconstruction technique. CONTRAST:  113m OMNIPAQUE IOHEXOL 300 MG/ML  SOLN COMPARISON:  04/09/2021, 03/07/2021 FINDINGS: Lower chest: No acute pleural or parenchymal lung disease. Hepatobiliary: No focal liver abnormality is seen. No gallstones, gallbladder wall thickening, or biliary dilatation. Pancreas: Unremarkable. No pancreatic ductal dilatation or surrounding inflammatory changes. Spleen: Normal in size without focal abnormality. Adrenals/Urinary Tract: Adrenal glands are unremarkable. Kidneys are normal, without renal calculi, focal lesion, or hydronephrosis. Bladder is decompressed, limiting its evaluation. Stomach/Bowel: Dilated fluid-filled small bowel loops are seen throughout the abdomen and pelvis, measuring up to 3.3 cm in diameter. Transition within the central mid abdomen near previous enterotomy staple lines, compatible with adhesions. Mild fecal retention throughout the  colon. No bowel wall thickening or inflammatory change. Vascular/Lymphatic: No significant vascular findings are present. No enlarged abdominal or pelvic lymph nodes. Reproductive: Uterus and bilateral adnexa are unremarkable. Other: Trace free fluid within the pelvis and the right upper quadrant. No free intraperitoneal gas. No abdominal wall hernia. Musculoskeletal: No acute or destructive bony lesions. Reconstructed images demonstrate no additional findings. IMPRESSION: 1. Small-bowel obstruction, with transition in the central mid abdomen near prior enterotomy staple lines consistent with adhesions. 2. Trace free fluid within the pelvis and right upper quadrant, likely reactive. Electronically Signed   By: MRanda NgoM.D.   On: 04/09/2021 22:52   DG Abd Portable 1V-Small Bowel Obstruction Protocol-initial, 8 hr delay  Result Date: 04/10/2021 CLINICAL DATA:  Small-bowel obstruction EXAM: PORTABLE ABDOMEN - 1 VIEW COMPARISON:  04/09/2021 FINDINGS: Administered oral contrast is seen throughout the colon which is  of normal caliber. Normal abdominal gas pattern. No gross free intraperitoneal gas. IMPRESSION: Negative. Electronically Signed   By: Fidela Salisbury M.D.   On: 04/10/2021 23:48     Scheduled Meds: Continuous Infusions:  promethazine (PHENERGAN) injection (IM or IVPB)       LOS: 1 day     Enzo Bi, MD [facility] If 7PM-7AM, please contact night-coverage 04/11/2021, 9:07 PM

## 2021-04-11 NOTE — Progress Notes (Signed)
Upon AM assessment, patient reported to me that she felt dizzy/lightheaded.  ?Patient has access to myChart and noticed her AM glucose levels on labs was 62.  ?Dr. Billie Ruddy notified that patient glucose was 62 and patient concerns of lightheadedness/dizzy.  ?Awaiting new orders.  ? ?

## 2021-04-11 NOTE — Plan of Care (Signed)

## 2021-04-12 LAB — CBC
HCT: 38.6 % (ref 36.0–46.0)
Hemoglobin: 13 g/dL (ref 12.0–15.0)
MCH: 29.2 pg (ref 26.0–34.0)
MCHC: 33.7 g/dL (ref 30.0–36.0)
MCV: 86.7 fL (ref 80.0–100.0)
Platelets: 258 10*3/uL (ref 150–400)
RBC: 4.45 MIL/uL (ref 3.87–5.11)
RDW: 12.2 % (ref 11.5–15.5)
WBC: 4.6 10*3/uL (ref 4.0–10.5)
nRBC: 0 % (ref 0.0–0.2)

## 2021-04-12 LAB — MAGNESIUM: Magnesium: 2 mg/dL (ref 1.7–2.4)

## 2021-04-12 LAB — BASIC METABOLIC PANEL
Anion gap: 7 (ref 5–15)
BUN: 5 mg/dL — ABNORMAL LOW (ref 6–20)
CO2: 25 mmol/L (ref 22–32)
Calcium: 9.1 mg/dL (ref 8.9–10.3)
Chloride: 107 mmol/L (ref 98–111)
Creatinine, Ser: 0.61 mg/dL (ref 0.44–1.00)
GFR, Estimated: >60 mL/min (ref >60)
Glucose, Bld: 86 mg/dL (ref 70–99)
Potassium: 4 mmol/L (ref 3.5–5.1)
Sodium: 139 mmol/L (ref 135–145)

## 2021-04-12 MED ORDER — TRAMADOL HCL 50 MG PO TABS: 50.0000 mg | ORAL_TABLET | Freq: Four times a day (QID) | ORAL | 0 refills | Status: DC | PRN

## 2021-04-12 NOTE — Discharge Summary (Signed)
Physician Discharge Summary   Paula Cooley  female DOB: 21-Nov-1990  ZGY:[id number]  PCP: Pcp, No  Admit date: 04/09/2021 Discharge date: 04/12/2021  Admitted From: home Disposition:  home CODE STATUS: Full code   Hospital Course:  For full details, please see H&P, progress notes, consult notes and ancillary notes.  Briefly,  [name] is a 31 year old female with history of perforated Meckel's diverticulum, history of small bowel obstruction, ADHD, who presented to emergency department for chief concerns of abdominal pain.   N/V and abdominal pain 2/2 Small bowel obstruction (HCC) --symptoms improved since presentation. Did not need NG tube. --GenSurg consulted, with Dr. Lysle Pearl --SBFT neg for obstruction on 3/1, and pt was started on clear liquid diet and advanced as tolerated.   --Per GenSurg, ok to discharge once tolerating regular diet.  Pt had a full regular meal for lunch on 3/3, but still complained of abdominal pain and requested quite a bit of PRN Norco and IV morphine, however pt requested to be discharged around evening time on 3/3.   --pt will follow up with Dr. Lysle Pearl as outpatient.   Lactic acidosis --resolved with IVF   Leukocytosis --started on Metronidazole and ceftriaxone on presentation. --likely reactive, as pt does not appear septic.  Abx d/c'ed.   AKI, POA, resolved --Cr 1.35 on presentation.  Likely due to N/V and dehydration.  Improved next day to 0.8 with IVF   Hypokalemia, POA --likely due to N/V --monitored and repleted PRN   Discharge Diagnoses:  Principal Problem:   Small bowel obstruction (Paula Cooley) Active Problems:   Chronic asthma   Exercise-induced asthma   Abdominal pain   Perforated Meckel's diverticulum   Intractable vomiting   SBO (small bowel obstruction) (HCC)   Leukocytosis   30 Day Unplanned Readmission Risk Score    Flowsheet Row ED to Hosp-Admission (Current) from 04/09/2021 in [place]  30 Day Unplanned Readmission Risk Score (%) 6.73 Filed at 04/12/2021 1600       This score is the patient's risk of an unplanned readmission within 30 days of being discharged (0 -100%). The score is based on dignosis, age, lab data, medications, orders, and past utilization.   Low:  0-14.9   Medium: 15-21.9   High: 22-29.9   Extreme: 30 and above         Discharge Instructions:  Allergies as of 04/12/2021       Reactions   No Known Allergies         Medication List     TAKE these medications    amphetamine-dextroamphetamine 20 MG tablet Commonly known as: Adderall Take 1 tablet (20 mg total) by mouth 2 (two) times daily.   Linzess 145 MCG Caps capsule Generic drug: linaclotide Take 145 mcg by mouth daily.   multivitamin with minerals tablet Take 1 tablet by mouth daily.   ondansetron 4 MG tablet Commonly known as: Zofran Take 1 tablet (4 mg total) by mouth daily as needed for nausea or vomiting.         Follow-up Information     Crosby, Isami, DO Follow up.   Specialty: Surgery Contact information: [place] [address] [phone]                 Allergies  Allergen Reactions   No Known Allergies      The results of significant diagnostics from this hospitalization (including imaging, microbiology, ancillary and laboratory) are listed below for reference.  Consultations:   Procedures/Studies: DG Abdomen 1 View  Result Date: 04/09/2021 CLINICAL DATA:  Abdominal pain. EXAM: ABDOMEN - 1 VIEW COMPARISON:  Radiograph dated 05/02/2020. CT abdomen pelvis dated 08/05/2021. FINDINGS: No bowel dilatation or evidence of obstruction. No free air or radiopaque calculi. The osseous structures are intact. The soft tissues are unremarkable. IMPRESSION: Negative. Electronically Signed   By: Anner Crete M.D.   On: 04/09/2021 21:02   CT ABDOMEN PELVIS W CONTRAST  Result Date: 04/09/2021 CLINICAL DATA:  Vomiting, abdominal  pain EXAM: CT ABDOMEN AND PELVIS WITH CONTRAST TECHNIQUE: Multidetector CT imaging of the abdomen and pelvis was performed using the standard protocol following bolus administration of intravenous contrast. RADIATION DOSE REDUCTION: This exam was performed according to the departmental dose-optimization program which includes automated exposure control, adjustment of the mA and/or kV according to patient size and/or use of iterative reconstruction technique. CONTRAST:  130m OMNIPAQUE IOHEXOL 300 MG/ML  SOLN COMPARISON:  04/09/2021, 03/07/2021 FINDINGS: Lower chest: No acute pleural or parenchymal lung disease. Hepatobiliary: No focal liver abnormality is seen. No gallstones, gallbladder wall thickening, or biliary dilatation. Pancreas: Unremarkable. No pancreatic ductal dilatation or surrounding inflammatory changes. Spleen: Normal in size without focal abnormality. Adrenals/Urinary Tract: Adrenal glands are unremarkable. Kidneys are normal, without renal calculi, focal lesion, or hydronephrosis. Bladder is decompressed, limiting its evaluation. Stomach/Bowel: Dilated fluid-filled small bowel loops are seen throughout the abdomen and pelvis, measuring up to 3.3 cm in diameter. Transition within the central mid abdomen near previous enterotomy staple lines, compatible with adhesions. Mild fecal retention throughout the colon. No bowel wall thickening or inflammatory change. Vascular/Lymphatic: No significant vascular findings are present. No enlarged abdominal or pelvic lymph nodes. Reproductive: Uterus and bilateral adnexa are unremarkable. Other: Trace free fluid within the pelvis and the right upper quadrant. No free intraperitoneal gas. No abdominal wall hernia. Musculoskeletal: No acute or destructive bony lesions. Reconstructed images demonstrate no additional findings. IMPRESSION: 1. Small-bowel obstruction, with transition in the central mid abdomen near prior enterotomy staple lines consistent with  adhesions. 2. Trace free fluid within the pelvis and right upper quadrant, likely reactive. Electronically Signed   By: MRanda NgoM.D.   On: 04/09/2021 22:52   DG Abd Portable 1V-Small Bowel Obstruction Protocol-initial, 8 hr delay  Result Date: 04/10/2021 CLINICAL DATA:  Small-bowel obstruction EXAM: PORTABLE ABDOMEN - 1 VIEW COMPARISON:  04/09/2021 FINDINGS: Administered oral contrast is seen throughout the colon which is of normal caliber. Normal abdominal gas pattern. No gross free intraperitoneal gas. IMPRESSION: Negative. Electronically Signed   By: AFidela SalisburyM.D.   On: 04/10/2021 23:48      Labs: BNP (last 3 results) No results for input(s): BNP in the last 8760 hours. Basic Metabolic Panel: Recent Labs  Lab 04/09/21 2022 04/09/21 2025 04/10/21 0501 04/11/21 0426 04/12/21 0454  NA  --  136 137 139 139  K  --  2.9* 4.4 3.9 4.0  CL  --  94* 107 104 107  CO2  --  23 23 22 25   GLUCOSE  --  160* 84 62* 86  BUN  --  18 15 10  5*  CREATININE  --  1.35* 0.80 0.77 0.61  CALCIUM  --  12.3* 8.8* 9.1 9.1  MG 2.4  --   --  2.0 2.0   Liver Function Tests: Recent Labs  Lab 04/09/21 2025  AST 24  ALT 20  ALKPHOS 82  BILITOT 0.5  PROT 10.0*  ALBUMIN 5.3*   Recent Labs  Lab 04/09/21 2025  LIPASE 45   No results for input(s): AMMONIA in the last 168 hours. CBC: Recent Labs  Lab 04/09/21 2025 04/10/21 0501 04/11/21 0426 04/12/21 0454  WBC 20.3* 8.7 7.2 4.6  HGB 17.4* 12.7 13.4 13.0  HCT 50.4* 36.0 40.7 38.6  MCV 83.6 83.3 88.3 86.7  PLT 426* 280 257 258   Cardiac Enzymes: No results for input(s): CKTOTAL, CKMB, CKMBINDEX, TROPONINI in the last 168 hours. BNP: Invalid input(s): POCBNP CBG: No results for input(s): GLUCAP in the last 168 hours. D-Dimer No results for input(s): DDIMER in the last 72 hours. Hgb A1c No results for input(s): HGBA1C in the last 72 hours. Lipid Profile No results for input(s): CHOL, HDL, LDLCALC, TRIG, CHOLHDL, LDLDIRECT in  the last 72 hours. Thyroid function studies No results for input(s): TSH, T4TOTAL, T3FREE, THYROIDAB in the last 72 hours.  Invalid input(s): FREET3 Anemia work up No results for input(s): VITAMINB12, FOLATE, FERRITIN, TIBC, IRON, RETICCTPCT in the last 72 hours. Urinalysis    Component Value Date/Time   COLORURINE YELLOW (A) 04/10/2021 0650   APPEARANCEUR CLOUDY (A) 04/10/2021 0650   LABSPEC >1.046 (H) 04/10/2021 0650   PHURINE 5.0 04/10/2021 0650   GLUCOSEU NEGATIVE 04/10/2021 0650   HGBUR LARGE (A) 04/10/2021 0650   BILIRUBINUR NEGATIVE 04/10/2021 0650   BILIRUBINUR neg 09/06/2012 1355   KETONESUR 20 (A) 04/10/2021 0650   PROTEINUR NEGATIVE 04/10/2021 0650   UROBILINOGEN negative 09/06/2012 1355   NITRITE NEGATIVE 04/10/2021 0650   LEUKOCYTESUR NEGATIVE 04/10/2021 0650   Sepsis Labs Invalid input(s): PROCALCITONIN,  WBC,  LACTICIDVEN Microbiology Recent Results (from the past 240 hour(s))  Culture, blood (single)     Status: None (Preliminary result)   Collection Time: 04/09/21 10:46 PM   Specimen: BLOOD  Result Value Ref Range Status   Specimen Description BLOOD LEFT FOREARM  Final   Special Requests   Final    BOTTLES DRAWN AEROBIC AND ANAEROBIC Blood Culture results may not be optimal due to an excessive volume of blood received in culture bottles   Culture   Final    NO GROWTH 3 DAYS Performed at [facility], [address], [place], [place] [address]    Report Status PENDING  Incomplete  Resp Panel by RT-PCR (Flu A&B, Covid) Nasopharyngeal Swab     Status: None   Collection Time: 04/09/21 10:46 PM   Specimen: Nasopharyngeal Swab; Nasopharyngeal(NP) swabs in vial transport medium  Result Value Ref Range Status   SARS Coronavirus 2 by RT PCR NEGATIVE NEGATIVE Final    Comment: (NOTE) SARS-CoV-2 target nucleic acids are NOT DETECTED.  The SARS-CoV-2 RNA is generally detectable in upper respiratory specimens during the acute phase of infection. The  lowest concentration of SARS-CoV-2 viral copies this assay can detect is 138 copies/mL. A negative result does not preclude SARS-Cov-2 infection and should not be used as the sole basis for treatment or other patient management decisions. A negative result may occur with  improper specimen collection/handling, submission of specimen other than nasopharyngeal swab, presence of viral mutation(s) within the areas targeted by this assay, and inadequate number of viral copies(<138 copies/mL). A negative result must be combined with clinical observations, patient history, and epidemiological information. The expected result is Negative.  Fact Sheet for Patients:  EntrepreneurPulse.com.au  Fact Sheet for Healthcare Providers:  IncredibleEmployment.be  This test is no t yet approved or cleared by the [place] FDA and  has been authorized for detection and/or diagnosis of SARS-CoV-2 by  FDA under an Emergency Use Authorization (EUA). This EUA will remain  in effect (meaning this test can be used) for the duration of the COVID-19 declaration under Section 564(b)(1) of the Act, 21 U.S.C.section 360bbb-3(b)(1), unless the authorization is terminated  or revoked sooner.       Influenza A by PCR NEGATIVE NEGATIVE Final   Influenza B by PCR NEGATIVE NEGATIVE Final    Comment: (NOTE) The Xpert Xpress SARS-CoV-2/FLU/RSV plus assay is intended as an aid in the diagnosis of influenza from Nasopharyngeal swab specimens and should not be used as a sole basis for treatment. Nasal washings and aspirates are unacceptable for Xpert Xpress SARS-CoV-2/FLU/RSV testing.  Fact Sheet for Patients: EntrepreneurPulse.com.au  Fact Sheet for Healthcare Providers: IncredibleEmployment.be  This test is not yet approved or cleared by the [place] FDA and has been authorized for detection and/or diagnosis of SARS-CoV-2 by FDA under  an Emergency Use Authorization (EUA). This EUA will remain in effect (meaning this test can be used) for the duration of the COVID-19 declaration under Section 564(b)(1) of the Act, 21 U.S.C. section 360bbb-3(b)(1), unless the authorization is terminated or revoked.  Performed at [facility], [address] [place], [place] [address]   Culture, blood (single) w Reflex to ID Panel     Status: None (Preliminary result)   Collection Time: 04/10/21 12:58 AM   Specimen: BLOOD  Result Value Ref Range Status   Specimen Description BLOOD RIGHT HAND  Final   Special Requests   Final    BOTTLES DRAWN AEROBIC AND ANAEROBIC Blood Culture adequate volume   Culture   Final    NO GROWTH 2 DAYS Performed at [facility], [address], [place], [place] [address]    Report Status PENDING  Incomplete     Total time spend on discharging this patient, including the last patient exam, discussing the hospital stay, instructions for ongoing care as it relates to all pertinent caregivers, as well as preparing the medical discharge records, prescriptions, and/or referrals as applicable, is 35 minutes.    Enzo Bi, MD  [facility] 04/12/2021, 6:28 PM

## 2021-04-12 NOTE — Progress Notes (Signed)
Subjective:  ?CC: ?Paula Cooley is a 31 y.o. female  Hospital stay day 2,   SBO ? ?HPI: ?BM overnight.  Pain continues to improve.  Tolerating full liquid ? ?ROS:  ?General: Denies weight loss, weight gain, fatigue, fevers, chills, and night sweats. ?Heart: Denies chest pain, palpitations, racing heart, irregular heartbeat, leg pain or swelling, and decreased activity tolerance. ?Respiratory: Denies breathing difficulty, shortness of breath, wheezing, cough, and sputum. ?GI: Denies change in appetite, heartburn, nausea, vomiting, constipation, diarrhea, and blood in stool. ?GU: Denies difficulty urinating, pain with urinating, urgency, frequency, blood in urine. ? ? ?Objective:  ? ?Temp:  [97.6 ?F (36.4 ?C)-98.8 ?F (37.1 ?C)] 97.6 ?F (36.4 ?C) (03/03 0805) ?Pulse Rate:  [69-80] 80 (03/03 0805) ?Resp:  [17-20] 20 (03/03 0805) ?BP: (106-117)/(67-75) 110/75 (03/03 0805) ?SpO2:  [99 %-100 %] 100 % (03/03 0805)     Height: 5' 6"  (167.6 cm) Weight: 80.2 kg BMI (Calculated): 28.55  ? ?Intake/Output this shift:  ? ?Intake/Output Summary (Last 24 hours) at 04/12/2021 1302 ?Last data filed at 04/12/2021 1015 ?Gross per 24 hour  ?Intake 1133.86 ml  ?Output --  ?Net 1133.86 ml  ? ? ?Constitutional :  alert, cooperative, appears stated age, and no distress  ?Respiratory:  clear to auscultation bilaterally  ?Cardiovascular:  regular rate and rhythm  ?Gastrointestinal: Soft, no guarding, focal TTP periumbilical still present .   ?Skin: Cool and moist.   ?Psychiatric: Normal affect, non-agitated, not confused  ?   ?  ?LABS:  ?CMP Latest Ref Rng & Units 04/12/2021 04/11/2021 04/10/2021  ?Glucose 70 - 99 mg/dL 86 62(L) 84  ?BUN 6 - 20 mg/dL 5(L) 10 15  ?Creatinine 0.44 - 1.00 mg/dL 0.61 0.77 0.80  ?Sodium 135 - 145 mmol/L 139 139 137  ?Potassium 3.5 - 5.1 mmol/L 4.0 3.9 4.4  ?Chloride 98 - 111 mmol/L 107 104 107  ?CO2 22 - 32 mmol/L 25 22 23   ?Calcium 8.9 - 10.3 mg/dL 9.1 9.1 8.8(L)  ?Total Protein 6.5 - 8.1 g/dL - - -  ?Total Bilirubin 0.3 -  1.2 mg/dL - - -  ?Alkaline Phos 38 - 126 U/L - - -  ?AST 15 - 41 U/L - - -  ?ALT 0 - 44 U/L - - -  ? ?CBC Latest Ref Rng & Units 04/12/2021 04/11/2021 04/10/2021  ?WBC 4.0 - 10.5 K/uL 4.6 7.2 8.7  ?Hemoglobin 12.0 - 15.0 g/dL 13.0 13.4 12.7  ?Hematocrit 36.0 - 46.0 % 38.6 40.7 36.0  ?Platelets 150 - 400 K/uL 258 257 280  ? ? ?RADS: ?SBFT- contrast noted on colon ?Assessment:  ? ?SBO- resolving.  Advancing diet as tolerated.  Ok to d/c once tolerating regular diet.  Will discuss possible outpt anastamosis revision due to recurrent issues, but will not proceed during this admission due to acute nature and dilation of bowel noted. ? ?labs/images/medications/previous chart entries reviewed personally and relevant changes/updates noted above. ? ? ?

## 2021-04-14 LAB — CULTURE, BLOOD (SINGLE): Culture: NO GROWTH

## 2021-04-15 ENCOUNTER — Ambulatory Visit: Payer: Self-pay | Admitting: Surgery

## 2021-04-15 LAB — CULTURE, BLOOD (SINGLE)
Culture: NO GROWTH
Special Requests: ADEQUATE

## 2021-04-15 MED ORDER — INDOCYANINE GREEN 25 MG IV SOLR
5.0000 mg | Freq: Once | INTRAVENOUS | Status: AC
  Filled 2021-04-15: qty 10

## 2021-04-15 NOTE — H&P (Signed)
Subjective:  CC: SBO  HPI:  Paula Cooley is a 31 y.o. female who was consulted by [facility] for issue above. Symptoms were first noted 2 days ago. Pain is sharp, confined to the periumbilical area, without radiation. Associated with N/V, exacerbated by nothing specific.  Hx of similar episodes in the past, with one episode showing SBO likely from adhesions, other showing possible enteritis.  Past Medical History: has a past medical history of Acetabular labrum tear, Acne, Asthma, Hypoglycemia, and Seasonal allergies.  Past Surgical History:       Past Surgical History:  Procedure Laterality Date   LAPAROSCOPY N/A 12/31/2018   Procedure: LAPAROSCOPY DIAGNOSTIC; Surgeon: Benjamine Sprague, DO; Location: [facility]; Service: General; Laterality: N/A;   LAPAROTOMY N/A 12/31/2018   Procedure: EXPLORATORY LAPAROTOMY WITH REDUCTION OF HERNIA; Surgeon: Benjamine Sprague, DO; Location: [facility]; Service: General; Laterality: N/A;   ROTATOR CUFF REPAIR Right 07/2011   as also bicep tendon surgery   SHOULDER ARTHROSCOPY WITH LABRAL REPAIR Right 01/01/2016   Procedure: SHOULDER ARTHROSCOPY WITH LABRAL (SLAP) REPAIR; Surgeon: Meredith Pel, MD; Location: [place]; Service: Orthopedics; Laterality: Right;   WISDOM TOOTH EXTRACTION     XI ROBOT ASSISTED DIAGNOSTIC LAPAROSCOPY N/A 12/28/2018   Procedure: XI ROBOT ASSISTED DIAGNOSTIC LAPAROSCOPY; Surgeon: Benjamine Sprague, DO; Location: [facility]; Service: General; Laterality: N/A;   Family History: family history includes Cancer in her paternal grandmother; Diabetes in her father; Heart disease (age of onset: 42) in her father; Hypertension in her mother.  Social History: reports that she quit smoking about 6 years ago. Her smoking use included cigarettes. She has never used smokeless tobacco. She reports current alcohol use. She reports that she does not use drugs.  Current Medications:         Prior to Admission medications   Medication Sig Start Date End Date Taking?  Authorizing Provider  LINZESS 145 MCG CAPS capsule Take 145 mcg by mouth daily. 03/18/21  Yes [provider]  Multiple Vitamins-Minerals (MULTIVITAMIN WITH MINERALS) tablet Take 1 tablet by mouth daily.   Yes [provider]  ondansetron (ZOFRAN) 4 MG tablet Take 1 tablet (4 mg total) by mouth daily as needed for nausea or vomiting. 03/07/21 03/07/22 Yes Jaishon Krisher, DO  amphetamine-dextroamphetamine (ADDERALL) 20 MG tablet Take 1 tablet (20 mg total) by mouth 2 (two) times daily.  Patient not taking: Reported on 04/09/2021 04/16/20   Carlena Hurl, PA-C  Allergies:       Allergies as of 04/09/2021 - Review Complete 04/09/2021  Allergen Reaction Noted   No known allergies  12/31/2015   ROS:  General: Denies weight loss, weight gain, fatigue, fevers, chills, and night sweats. Eyes: Denies blurry vision, double vision, eye pain, itchy eyes, and tearing.  Ears: Denies hearing loss, earache, and ringing in ears. Nose: Denies sinus pain, congestion, infections, runny nose, and nosebleeds. Mouth/throat: Denies hoarseness, sore throat, bleeding gums, and difficulty swallowing. Heart: Denies chest pain, palpitations, racing heart, irregular heartbeat, leg pain or swelling, and decreased activity tolerance. Respiratory: Denies breathing difficulty, shortness of breath, wheezing, cough, and sputum. GI: Denies change in appetite, heartburn, and blood in stool. GU: Denies difficulty urinating, pain with urinating, urgency, frequency, blood in urine.  Musculoskeletal: Denies joint stiffness, pain, swelling, muscle weakness.  Skin: Denies rash, itching, mass, tumors, sores, and boils  Neurologic: Denies headache, fainting, dizziness, seizures, numbness, and tingling. Psychiatric: Denies depression, anxiety, difficulty sleeping, and memory loss. Endocrine: Denies heat or cold intolerance, and increased thirst or urination.  Blood/lymph: Denies easy bruising, easy bruising, and swollen  glands Objective:   BP 118/79 (BP Location: Right Arm)   Pulse 88   Temp 98.5 F (36.9 C)   Resp 16   Ht 5' 6"  (1.676 m)   Wt 80.2 kg   LMP 04/08/2021   SpO2 99%   BMI 28.54 kg/m  Constitutional :  alert, cooperative, appears stated age, and no distress  Lymphatics/Throat:  no asymmetry, masses, or scars  Respiratory:  clear to auscultation bilaterally  Cardiovascular:  regular rate and rhythm  Gastrointestinal: Soft, focal TTP RLQ and periumbilical region but no distention, no guarding .   Musculoskeletal: Steady movement  Skin: Cool and moist, midline and laparoscopic surgical scars   Psychiatric: Normal affect, non-agitated, not confused     LABS:  CMP Latest Ref Rng & Units 04/10/2021 04/09/2021 05/01/2020  Glucose 70 - 99 mg/dL 84 160(H) 86  BUN 6 - 20 mg/dL 15 18 13   Creatinine 0.44 - 1.00 mg/dL 0.80 1.35(H) 0.79  Sodium 135 - 145 mmol/L 137 136 141  Potassium 3.5 - 5.1 mmol/L 4.4 2.9(L) 3.7  Chloride 98 - 111 mmol/L 107 94(L) 108  CO2 22 - 32 mmol/L 23 23 25   Calcium 8.9 - 10.3 mg/dL 8.8(L) 12.3(H) 8.7(L)  Total Protein 6.5 - 8.1 g/dL - 10.0(H) -  Total Bilirubin 0.3 - 1.2 mg/dL - 0.5 -  Alkaline Phos 38 - 126 U/L - 82 -  AST 15 - 41 U/L - 24 -  ALT 0 - 44 U/L - 20 -   CBC Latest Ref Rng & Units 04/10/2021 04/09/2021 05/01/2020  WBC 4.0 - 10.5 K/uL 8.7 20.3(H) 6.1  Hemoglobin 12.0 - 15.0 g/dL 12.7 17.4(H) 12.4  Hematocrit 36.0 - 46.0 % 36.0 50.4(H) 36.7  Platelets 150 - 400 K/uL 280 426(H) 202   RADS:  CLINICAL DATA: Vomiting, abdominal pain  EXAM:  CT ABDOMEN AND PELVIS WITH CONTRAST  TECHNIQUE:  Multidetector CT imaging of the abdomen and pelvis was performed  using the standard protocol following bolus administration of  intravenous contrast.  RADIATION DOSE REDUCTION: This exam was performed according to the  departmental dose-optimization program which includes automated  exposure control, adjustment of the mA and/or kV according to  patient size and/or use of  iterative reconstruction technique.  CONTRAST: 1100m OMNIPAQUE IOHEXOL 300 MG/ML SOLN  COMPARISON: 04/09/2021, 03/07/2021  FINDINGS:  Lower chest: No acute pleural or parenchymal lung disease.  Hepatobiliary: No focal liver abnormality is seen. No gallstones,  gallbladder wall thickening, or biliary dilatation.  Pancreas: Unremarkable. No pancreatic ductal dilatation or  surrounding inflammatory changes.  Spleen: Normal in size without focal abnormality.  Adrenals/Urinary Tract: Adrenal glands are unremarkable. Kidneys are  normal, without renal calculi, focal lesion, or hydronephrosis.  Bladder is decompressed, limiting its evaluation.  Stomach/Bowel: Dilated fluid-filled small bowel loops are seen  throughout the abdomen and pelvis, measuring up to 3.3 cm in  diameter. Transition within the central mid abdomen near previous  enterotomy staple lines, compatible with adhesions. Mild fecal  retention throughout the colon. No bowel wall thickening or  inflammatory change.  Vascular/Lymphatic: No significant vascular findings are present. No  enlarged abdominal or pelvic lymph nodes.  Reproductive: Uterus and bilateral adnexa are unremarkable.  Other: Trace free fluid within the pelvis and the right upper  quadrant. No free intraperitoneal gas. No abdominal wall hernia.  Musculoskeletal: No acute or destructive bony lesions. Reconstructed  images demonstrate no additional findings.  IMPRESSION:  1. Small-bowel  obstruction, with transition in the central mid  abdomen near prior enterotomy staple lines consistent with  adhesions.  2. Trace free fluid within the pelvis and right upper quadrant,  likely reactive.  Electronically Signed  By: Randa Ngo M.D.  On: 04/09/2021 22:52  Assessment:  SBO, based on CT read    Plan:  Due to recurrent nature of these episode as well as chronic localized pain to the area of anastamosis, pt wishes to proceed with dx lap, possible anastamosis  revision.  Discussed pathophisiology and treatment options including NG tube decompression and possible surgery for lysis of adhesions, laparoscopic or open.  The risk of surgery include, but not limited to, recurrence, bleeding, chronic pain, post-op infxn, post-op SBO or ileus, hernias, resection of bowel, re-anastamosis, and need for re-operation to address said risks. The risks of general anesthetic, if used, includes MI, CVA, sudden death or even reaction to anesthetic medications also discussed. Alternatives include continued observation and NG decompression.  Benefits include possible symptom relief, preventing further decline in health and possible death.  Typical post-op recovery time of additional days in hospital for observation afterwards also discussed.  The patient verbalized understanding and all questions were answered to the patient's satisfaction.

## 2021-04-15 NOTE — H&P (View-Only) (Signed)
Subjective:  ?CC: SBO  ?HPI:  ?Paula Cooley is a 31 y.o. female who was consulted by [facility] for issue above. Symptoms were first noted 2 days ago. Pain is sharp, confined to the periumbilical area, without radiation. Associated with N/V, exacerbated by nothing specific.  ?Hx of similar episodes in the past, with one episode showing SBO likely from adhesions, other showing possible enteritis.  ?Past Medical History: has a past medical history of Acetabular labrum tear, Acne, Asthma, Hypoglycemia, and Seasonal allergies.  ?Past Surgical History:  ?     ?Past Surgical History:  ?Procedure Laterality Date  ? LAPAROSCOPY N/A 12/31/2018  ? Procedure: LAPAROSCOPY DIAGNOSTIC; Surgeon: Benjamine Sprague, DO; Location: [facility]; Service: General; Laterality: N/A;  ? LAPAROTOMY N/A 12/31/2018  ? Procedure: EXPLORATORY LAPAROTOMY WITH REDUCTION OF HERNIA; Surgeon: Benjamine Sprague, DO; Location: [facility]; Service: General; Laterality: N/A;  ? ROTATOR CUFF REPAIR Right 07/2011  ? as also bicep tendon surgery  ? SHOULDER ARTHROSCOPY WITH LABRAL REPAIR Right 01/01/2016  ? Procedure: SHOULDER ARTHROSCOPY WITH LABRAL (SLAP) REPAIR; Surgeon: Meredith Pel, MD; Location: [place]; Service: Orthopedics; Laterality: Right;  ? WISDOM TOOTH EXTRACTION    ? XI ROBOT ASSISTED DIAGNOSTIC LAPAROSCOPY N/A 12/28/2018  ? Procedure: XI ROBOT ASSISTED DIAGNOSTIC LAPAROSCOPY; Surgeon: Benjamine Sprague, DO; Location: [facility]; Service: General; Laterality: N/A;  ? ?Family History: family history includes Cancer in her paternal grandmother; Diabetes in her father; Heart disease (age of onset: 66) in her father; Hypertension in her mother.  ?Social History: reports that she quit smoking about 6 years ago. Her smoking use included cigarettes. She has never used smokeless tobacco. She reports current alcohol use. She reports that she does not use drugs.  ?Current Medications:  ?       ?Prior to Admission medications   ?Medication Sig Start Date End Date Taking?  Authorizing Provider  ?LINZESS 145 MCG CAPS capsule Take 145 mcg by mouth daily. 03/18/21  Yes [provider]  ?Multiple Vitamins-Minerals (MULTIVITAMIN WITH MINERALS) tablet Take 1 tablet by mouth daily.   Yes [provider]  ?ondansetron (ZOFRAN) 4 MG tablet Take 1 tablet (4 mg total) by mouth daily as needed for nausea or vomiting. 03/07/21 03/07/22 Yes Moataz Tavis, DO  ?amphetamine-dextroamphetamine (ADDERALL) 20 MG tablet Take 1 tablet (20 mg total) by mouth 2 (two) times daily.  ?Patient not taking: Reported on 04/09/2021 04/16/20   Carlena Hurl, PA-C  ?Allergies:  ?     ?Allergies as of 04/09/2021 - Review Complete 04/09/2021  ?Allergen Reaction Noted  ? No known allergies  12/31/2015  ? ?ROS:  ?General: Denies weight loss, weight gain, fatigue, fevers, chills, and night sweats. ?Eyes: Denies blurry vision, double vision, eye pain, itchy eyes, and tearing.  ?Ears: Denies hearing loss, earache, and ringing in ears. ?Nose: Denies sinus pain, congestion, infections, runny nose, and nosebleeds. ?Mouth/throat: Denies hoarseness, sore throat, bleeding gums, and difficulty swallowing. ?Heart: Denies chest pain, palpitations, racing heart, irregular heartbeat, leg pain or swelling, and decreased activity tolerance. ?Respiratory: Denies breathing difficulty, shortness of breath, wheezing, cough, and sputum. ?GI: Denies change in appetite, heartburn, and blood in stool. ?GU: Denies difficulty urinating, pain with urinating, urgency, frequency, blood in urine.  ?Musculoskeletal: Denies joint stiffness, pain, swelling, muscle weakness.  ?Skin: Denies rash, itching, mass, tumors, sores, and boils  ?Neurologic: Denies headache, fainting, dizziness, seizures, numbness, and tingling. ?Psychiatric: Denies depression, anxiety, difficulty sleeping, and memory loss. ?Endocrine: Denies heat or cold intolerance, and increased thirst or urination. ?  Blood/lymph: Denies easy bruising, easy bruising, and swollen  glands ?Objective:  ? ?BP 118/79 (BP Location: Right Arm)  Pulse 88  Temp 98.5 ?F (36.9 ?C)  Resp 16  Ht 5' 6"  (1.676 m)  Wt 80.2 kg  LMP 04/08/2021  SpO2 99%  BMI 28.54 kg/m?  ?Constitutional :  alert, cooperative, appears stated age, and no distress  ?Lymphatics/Throat:  no asymmetry, masses, or scars  ?Respiratory:  clear to auscultation bilaterally  ?Cardiovascular:  regular rate and rhythm  ?Gastrointestinal: Soft, focal TTP RLQ and periumbilical region but no distention, no guarding .   ?Musculoskeletal: Steady movement  ?Skin: Cool and moist, midline and laparoscopic surgical scars   ?Psychiatric: Normal affect, non-agitated, not confused  ?   ?LABS:  ?CMP Latest Ref Rng & Units 04/10/2021 04/09/2021 05/01/2020  ?Glucose 70 - 99 mg/dL 84 160(H) 86  ?BUN 6 - 20 mg/dL 15 18 13   ?Creatinine 0.44 - 1.00 mg/dL 0.80 1.35(H) 0.79  ?Sodium 135 - 145 mmol/L 137 136 141  ?Potassium 3.5 - 5.1 mmol/L 4.4 2.9(L) 3.7  ?Chloride 98 - 111 mmol/L 107 94(L) 108  ?CO2 22 - 32 mmol/L 23 23 25   ?Calcium 8.9 - 10.3 mg/dL 8.8(L) 12.3(H) 8.7(L)  ?Total Protein 6.5 - 8.1 g/dL - 10.0(H) -  ?Total Bilirubin 0.3 - 1.2 mg/dL - 0.5 -  ?Alkaline Phos 38 - 126 U/L - 82 -  ?AST 15 - 41 U/L - 24 -  ?ALT 0 - 44 U/L - 20 -  ? ?CBC Latest Ref Rng & Units 04/10/2021 04/09/2021 05/01/2020  ?WBC 4.0 - 10.5 K/uL 8.7 20.3(H) 6.1  ?Hemoglobin 12.0 - 15.0 g/dL 12.7 17.4(H) 12.4  ?Hematocrit 36.0 - 46.0 % 36.0 50.4(H) 36.7  ?Platelets 150 - 400 K/uL 280 426(H) 202  ? ?RADS:  ?CLINICAL DATA: Vomiting, abdominal pain  ?EXAM:  ?CT ABDOMEN AND PELVIS WITH CONTRAST  ?TECHNIQUE:  ?Multidetector CT imaging of the abdomen and pelvis was performed  ?using the standard protocol following bolus administration of  ?intravenous contrast.  ?RADIATION DOSE REDUCTION: This exam was performed according to the  ?departmental dose-optimization program which includes automated  ?exposure control, adjustment of the mA and/or kV according to  ?patient size and/or use of  iterative reconstruction technique.  ?CONTRAST: 131m OMNIPAQUE IOHEXOL 300 MG/ML SOLN  ?COMPARISON: 04/09/2021, 03/07/2021  ?FINDINGS:  ?Lower chest: No acute pleural or parenchymal lung disease.  ?Hepatobiliary: No focal liver abnormality is seen. No gallstones,  ?gallbladder wall thickening, or biliary dilatation.  ?Pancreas: Unremarkable. No pancreatic ductal dilatation or  ?surrounding inflammatory changes.  ?Spleen: Normal in size without focal abnormality.  ?Adrenals/Urinary Tract: Adrenal glands are unremarkable. Kidneys are  ?normal, without renal calculi, focal lesion, or hydronephrosis.  ?Bladder is decompressed, limiting its evaluation.  ?Stomach/Bowel: Dilated fluid-filled small bowel loops are seen  ?throughout the abdomen and pelvis, measuring up to 3.3 cm in  ?diameter. Transition within the central mid abdomen near previous  ?enterotomy staple lines, compatible with adhesions. Mild fecal  ?retention throughout the colon. No bowel wall thickening or  ?inflammatory change.  ?Vascular/Lymphatic: No significant vascular findings are present. No  ?enlarged abdominal or pelvic lymph nodes.  ?Reproductive: Uterus and bilateral adnexa are unremarkable.  ?Other: Trace free fluid within the pelvis and the right upper  ?quadrant. No free intraperitoneal gas. No abdominal wall hernia.  ?Musculoskeletal: No acute or destructive bony lesions. Reconstructed  ?images demonstrate no additional findings.  ?IMPRESSION:  ?1. Small-bowel obstruction, with transition in the central mid  ?  abdomen near prior enterotomy staple lines consistent with  ?adhesions.  ?2. Trace free fluid within the pelvis and right upper quadrant,  ?likely reactive.  ?Electronically Signed  ?By: Randa Ngo M.D.  ?On: 04/09/2021 22:52  ?Assessment:  ?SBO, based on CT read  ? ? ?Plan:  ?Due to recurrent nature of these episode as well as chronic localized pain to the area of anastamosis, pt wishes to proceed with dx lap, possible anastamosis  revision. ? ?Discussed pathophisiology and treatment options including NG tube decompression and possible surgery for lysis of adhesions, laparoscopic or open.  The risk of surgery include, but not limited to, recurrence,

## 2021-05-03 ENCOUNTER — Encounter
Admission: RE | Admit: 2021-05-03 | Discharge: 2021-05-03 | Disposition: A | Discharge status: Home / Self Care | Payer: 59 | Source: Ambulatory Visit | Attending: Surgery | Admitting: Surgery

## 2021-05-03 ENCOUNTER — Other Ambulatory Visit: Payer: Self-pay

## 2021-05-03 NOTE — Patient Instructions (Signed)
Your procedure is scheduled on: 05/07/21 Report to [place]. To find out your arrival time please call [phone] between 1PM - 3PM on 05/06/21.  Remember: Instructions that are not followed completely may result in serious medical risk, up to and including death, or upon the discretion of your surgeon and anesthesiologist your surgery may need to be rescheduled.     _X__ 1. Do not eat food after midnight the night before your procedure.                 No gum chewing or hard candies. You may drink clear liquids up to 2 hours                 before you are scheduled to arrive for your surgery- DO not drink clear                 liquids within 2 hours of the start of your surgery.                 Clear Liquids include:  water, apple juice without pulp, clear carbohydrate                 drink such as Clearfast or Gatorade, Black Coffee or Tea (Do not add                 anything to coffee or tea). Diabetics water only  __X__2.  On the morning of surgery brush your teeth with toothpaste and water, you                 may rinse your mouth with mouthwash if you wish.  Do not swallow any              toothpaste of mouthwash.     _X__ 3.  No Alcohol for 24 hours before or after surgery.   _X__ 4.  Do Not Smoke or use e-cigarettes For 24 Hours Prior to Your Surgery.                 Do not use any chewable tobacco products for at least 6 hours prior to                 surgery.  ____  5.  Bring all medications with you on the day of surgery if instructed.   __X__  6.  Notify your doctor if there is any change in your medical condition      (cold, fever, infections).     Do not wear jewelry, make-up, hairpins, clips or nail polish. Do not wear lotions, powders, or perfumes. May wear deodorant Do not shave body hair 48 hours prior to surgery. Men may shave face and neck. Do not bring valuables to the hospital.    [facility] is not  responsible for any belongings or valuables.  Contacts, dentures/partials or body piercings may not be worn into surgery. Bring a case for your contacts, glasses or hearing aids, a denture cup will be supplied. Leave your suitcase in the car. After surgery it may be brought to your room. For patients admitted to the hospital, discharge time is determined by your treatment team.   Patients discharged the day of surgery will not be allowed to drive home.   Please read over the following fact sheets that you were given:     __X__ Take these medicines the morning of surgery with A SIP OF  WATER:    1. cetirizine (ZYRTEC) 10 MG tablet  2.   3.   4.  5.  6.  ____ Fleet Enema (as directed)   ____ Use CHG Soap/SAGE wipes as directed  ____ Use inhalers on the day of surgery  ____ Stop metformin/Janumet/Farxiga 2 days prior to surgery    ____ Take 1/2 of usual insulin dose the night before surgery. No insulin the morning          of surgery.   ____ Stop Blood Thinners Coumadin/Plavix/Xarelto/Pleta/Pradaxa/Eliquis/Effient/Aspirin  on   Or contact your Surgeon, Cardiologist or Medical Doctor regarding  ability to stop your blood thinners  __X__ Stop Anti-inflammatories 7 days before surgery such as Advil, Ibuprofen, Motrin,  BC or Goodies Powder, Naprosyn, Naproxen, Aleve, Aspirin    __X__ Stop all herbals and supplements, fish oil or vitamins  until after surgery.    ____ Bring C-Pap to the hospital.    Pick up a bar of DIAL "GOLD" antibacterial soap and shower with this soap this weekend Monday night and Tuesday morning.

## 2021-05-06 MED ORDER — SODIUM CHLORIDE 0.9 % IV SOLN
2.0000 g | INTRAVENOUS | Status: AC
  Administered 2021-05-07: 2 g via INTRAVENOUS

## 2021-05-06 MED ORDER — LACTATED RINGERS IV SOLN
INTRAVENOUS | Status: DC
Start: 2021-05-06 — End: 2021-05-07

## 2021-05-06 MED ORDER — ACETAMINOPHEN 500 MG PO TABS: 1000.0000 mg | ORAL_TABLET | ORAL | Status: AC

## 2021-05-06 MED ORDER — FAMOTIDINE 20 MG PO TABS: 20.0000 mg | ORAL_TABLET | Freq: Once | ORAL | Status: AC

## 2021-05-06 MED ORDER — CHLORHEXIDINE GLUCONATE CLOTH 2 % EX PADS: 6.0000 | MEDICATED_PAD | Freq: Once | CUTANEOUS | Status: DC

## 2021-05-06 MED ORDER — ORAL CARE MOUTH RINSE: 15.0000 mL | Freq: Once | OROMUCOSAL | Status: AC

## 2021-05-06 MED ORDER — GABAPENTIN 300 MG PO CAPS: 300.0000 mg | ORAL_CAPSULE | ORAL | Status: AC

## 2021-05-06 MED ORDER — CHLORHEXIDINE GLUCONATE 0.12 % MT SOLN: 15.0000 mL | Freq: Once | OROMUCOSAL | Status: AC

## 2021-05-06 MED ORDER — CELECOXIB 200 MG PO CAPS: 200.0000 mg | ORAL_CAPSULE | ORAL | Status: AC

## 2021-05-07 ENCOUNTER — Inpatient Hospital Stay: Payer: 59 | Admitting: Certified Registered"

## 2021-05-07 ENCOUNTER — Encounter: Payer: Self-pay | Admitting: Surgery

## 2021-05-07 ENCOUNTER — Other Ambulatory Visit: Payer: Self-pay

## 2021-05-07 ENCOUNTER — Inpatient Hospital Stay
Admission: RE | Admit: 2021-05-07 | Discharge: 2021-05-10 | DRG: 331 | Disposition: A | Discharge status: Home / Self Care | Payer: 59 | Attending: Surgery | Admitting: Surgery

## 2021-05-07 ENCOUNTER — Encounter
Admission: RE | Disposition: A | Discharge status: Home / Self Care | Payer: Self-pay | Source: Home / Self Care | Attending: Surgery

## 2021-05-07 DIAGNOSIS — Z79899 Other long term (current) drug therapy: Secondary | ICD-10-CM

## 2021-05-07 DIAGNOSIS — R3911 Hesitancy of micturition: Secondary | ICD-10-CM | POA: Diagnosis not present

## 2021-05-07 DIAGNOSIS — K565 Intestinal adhesions [bands], unspecified as to partial versus complete obstruction: Principal | ICD-10-CM | POA: Diagnosis present

## 2021-05-07 DIAGNOSIS — J45909 Unspecified asthma, uncomplicated: Secondary | ICD-10-CM | POA: Diagnosis present

## 2021-05-07 DIAGNOSIS — Z87891 Personal history of nicotine dependence: Secondary | ICD-10-CM | POA: Diagnosis not present

## 2021-05-07 DIAGNOSIS — K56609 Unspecified intestinal obstruction, unspecified as to partial versus complete obstruction: Principal | ICD-10-CM | POA: Diagnosis present

## 2021-05-07 HISTORY — PX: XI ROBOTIC ASSISTED SMALL BOWEL RESECTION: SHX6872

## 2021-05-07 LAB — POCT PREGNANCY, URINE: Preg Test, Ur: NEGATIVE

## 2021-05-07 SURGERY — EXCISION, SMALL INTESTINE, ROBOT-ASSISTED
Anesthesia: General

## 2021-05-07 MED ORDER — KETAMINE HCL 10 MG/ML IJ SOLN
INTRAMUSCULAR | Status: DC | PRN
  Administered 2021-05-07: 25 mg via INTRAVENOUS

## 2021-05-07 MED ORDER — ONDANSETRON HCL 4 MG/2ML IJ SOLN: 4.0000 mg | Freq: Once | INTRAMUSCULAR | Status: DC | PRN

## 2021-05-07 MED ORDER — FENTANYL CITRATE (PF) 100 MCG/2ML IJ SOLN
INTRAMUSCULAR | Status: AC
  Filled 2021-05-07: qty 2

## 2021-05-07 MED ORDER — ROCURONIUM BROMIDE 100 MG/10ML IV SOLN
INTRAVENOUS | Status: DC | PRN
Start: 2021-05-07 — End: 2021-05-07
  Administered 2021-05-07: 50 mg via INTRAVENOUS

## 2021-05-07 MED ORDER — BUPIVACAINE LIPOSOME 1.3 % IJ SUSP
INTRAMUSCULAR | Status: AC
  Filled 2021-05-07: qty 20

## 2021-05-07 MED ORDER — HYDROMORPHONE HCL 1 MG/ML IJ SOLN
INTRAMUSCULAR | Status: AC
  Administered 2021-05-07: 0.5 mg via INTRAVENOUS
  Filled 2021-05-07: qty 1

## 2021-05-07 MED ORDER — HYDROMORPHONE HCL 1 MG/ML IJ SOLN
0.5000 mg | INTRAMUSCULAR | Status: DC | PRN
  Administered 2021-05-07: 0.5 mg via INTRAVENOUS
  Filled 2021-05-07: qty 0.5

## 2021-05-07 MED ORDER — LIDOCAINE HCL (CARDIAC) PF 100 MG/5ML IV SOSY
PREFILLED_SYRINGE | INTRAVENOUS | Status: DC | PRN
  Administered 2021-05-07: 60 mg via INTRAVENOUS

## 2021-05-07 MED ORDER — ONDANSETRON HCL 4 MG PO TABS: 4.0000 mg | ORAL_TABLET | Freq: Every day | ORAL | Status: DC | PRN

## 2021-05-07 MED ORDER — PROPOFOL 10 MG/ML IV BOLUS
INTRAVENOUS | Status: AC
  Filled 2021-05-07: qty 20

## 2021-05-07 MED ORDER — ACETAMINOPHEN 10 MG/ML IV SOLN: 1000.0000 mg | Freq: Once | INTRAVENOUS | Status: DC | PRN

## 2021-05-07 MED ORDER — MIDAZOLAM HCL 2 MG/2ML IJ SOLN
INTRAMUSCULAR | Status: AC
  Filled 2021-05-07: qty 2

## 2021-05-07 MED ORDER — KETAMINE HCL 50 MG/5ML IJ SOSY
PREFILLED_SYRINGE | INTRAMUSCULAR | Status: AC
  Filled 2021-05-07: qty 5

## 2021-05-07 MED ORDER — FENTANYL CITRATE (PF) 100 MCG/2ML IJ SOLN
INTRAMUSCULAR | Status: DC | PRN
Start: 2021-05-07 — End: 2021-05-07
  Administered 2021-05-07 (×2): 50 ug via INTRAVENOUS

## 2021-05-07 MED ORDER — FENTANYL CITRATE (PF) 100 MCG/2ML IJ SOLN
25.0000 ug | INTRAMUSCULAR | Status: DC | PRN
  Administered 2021-05-07: 50 ug via INTRAVENOUS

## 2021-05-07 MED ORDER — ONDANSETRON HCL 4 MG/2ML IJ SOLN
4.0000 mg | Freq: Four times a day (QID) | INTRAMUSCULAR | Status: DC | PRN
  Administered 2021-05-08: 4 mg via INTRAVENOUS
  Filled 2021-05-07 (×2): qty 2

## 2021-05-07 MED ORDER — CELECOXIB 200 MG PO CAPS
ORAL_CAPSULE | ORAL | Status: AC
  Administered 2021-05-07: 200 mg via ORAL
  Filled 2021-05-07: qty 1

## 2021-05-07 MED ORDER — 0.9 % SODIUM CHLORIDE (POUR BTL) OPTIME
TOPICAL | Status: DC | PRN
  Administered 2021-05-07: 500 mL

## 2021-05-07 MED ORDER — SEVOFLURANE IN SOLN
RESPIRATORY_TRACT | Status: AC
  Filled 2021-05-07: qty 250

## 2021-05-07 MED ORDER — TRAMADOL HCL 50 MG PO TABS: 50.0000 mg | ORAL_TABLET | Freq: Four times a day (QID) | ORAL | Status: DC | PRN

## 2021-05-07 MED ORDER — ONDANSETRON 4 MG PO TBDP
4.0000 mg | ORAL_TABLET | Freq: Four times a day (QID) | ORAL | Status: DC | PRN
Start: 2021-05-07 — End: 2021-05-10

## 2021-05-07 MED ORDER — MIDAZOLAM HCL 2 MG/2ML IJ SOLN
INTRAMUSCULAR | Status: DC | PRN
  Administered 2021-05-07: 2 mg via INTRAVENOUS

## 2021-05-07 MED ORDER — DEXMEDETOMIDINE (PRECEDEX) IN NS 20 MCG/5ML (4 MCG/ML) IV SYRINGE
PREFILLED_SYRINGE | INTRAVENOUS | Status: DC | PRN
  Administered 2021-05-07: 12 ug via INTRAVENOUS
  Administered 2021-05-07: 8 ug via INTRAVENOUS

## 2021-05-07 MED ORDER — ONDANSETRON HCL 4 MG/2ML IJ SOLN
INTRAMUSCULAR | Status: AC
  Filled 2021-05-07: qty 2

## 2021-05-07 MED ORDER — ACETAMINOPHEN 500 MG PO TABS
ORAL_TABLET | ORAL | Status: AC
  Administered 2021-05-07: 1000 mg via ORAL
  Filled 2021-05-07: qty 2

## 2021-05-07 MED ORDER — SODIUM CHLORIDE (PF) 0.9 % IJ SOLN
INTRAMUSCULAR | Status: DC | PRN
  Administered 2021-05-07: 70 mL via INTRAMUSCULAR

## 2021-05-07 MED ORDER — GABAPENTIN 300 MG PO CAPS
ORAL_CAPSULE | ORAL | Status: AC
  Administered 2021-05-07: 300 mg via ORAL
  Filled 2021-05-07: qty 1

## 2021-05-07 MED ORDER — OXYCODONE HCL 5 MG PO TABS
ORAL_TABLET | ORAL | Status: AC
  Administered 2021-05-07: 5 mg via ORAL
  Filled 2021-05-07: qty 1

## 2021-05-07 MED ORDER — ONDANSETRON HCL 4 MG/2ML IJ SOLN
INTRAMUSCULAR | Status: DC | PRN
Start: 2021-05-07 — End: 2021-05-07
  Administered 2021-05-07: 4 mg via INTRAVENOUS

## 2021-05-07 MED ORDER — OXYCODONE HCL 5 MG PO TABS: 5.0000 mg | ORAL_TABLET | Freq: Once | ORAL | Status: AC | PRN

## 2021-05-07 MED ORDER — CHLORHEXIDINE GLUCONATE 0.12 % MT SOLN
OROMUCOSAL | Status: AC
  Administered 2021-05-07: 15 mL via OROMUCOSAL
  Filled 2021-05-07: qty 15

## 2021-05-07 MED ORDER — AMPHETAMINE-DEXTROAMPHETAMINE 10 MG PO TABS
20.0000 mg | ORAL_TABLET | Freq: Two times a day (BID) | ORAL | Status: DC
  Administered 2021-05-09 – 2021-05-10 (×3): 20 mg via ORAL
  Filled 2021-05-07 (×4): qty 2

## 2021-05-07 MED ORDER — HYDROMORPHONE HCL 1 MG/ML IJ SOLN
1.0000 mg | INTRAMUSCULAR | Status: DC | PRN
  Administered 2021-05-07 – 2021-05-08 (×4): 1 mg via INTRAVENOUS
  Filled 2021-05-07 (×4): qty 1

## 2021-05-07 MED ORDER — LINACLOTIDE 145 MCG PO CAPS
145.0000 ug | ORAL_CAPSULE | Freq: Two times a day (BID) | ORAL | Status: DC
  Administered 2021-05-07 – 2021-05-08 (×3): 145 ug via ORAL
  Filled 2021-05-07 (×3): qty 1

## 2021-05-07 MED ORDER — DEXAMETHASONE SODIUM PHOSPHATE 10 MG/ML IJ SOLN
INTRAMUSCULAR | Status: DC | PRN
  Administered 2021-05-07: 10 mg via INTRAVENOUS

## 2021-05-07 MED ORDER — HYDROMORPHONE HCL 1 MG/ML IJ SOLN
0.5000 mg | INTRAMUSCULAR | Status: AC | PRN
  Administered 2021-05-07 (×3): 0.5 mg via INTRAVENOUS

## 2021-05-07 MED ORDER — GABAPENTIN 300 MG PO CAPS
300.0000 mg | ORAL_CAPSULE | Freq: Two times a day (BID) | ORAL | Status: DC
  Administered 2021-05-07 – 2021-05-10 (×7): 300 mg via ORAL
  Filled 2021-05-07 (×7): qty 1

## 2021-05-07 MED ORDER — SODIUM CHLORIDE FLUSH 0.9 % IV SOLN
INTRAVENOUS | Status: AC
  Filled 2021-05-07: qty 20

## 2021-05-07 MED ORDER — BUPIVACAINE HCL (PF) 0.5 % IJ SOLN
INTRAMUSCULAR | Status: AC
  Filled 2021-05-07: qty 30

## 2021-05-07 MED ORDER — FAMOTIDINE 20 MG PO TABS
ORAL_TABLET | ORAL | Status: AC
Start: 2021-05-07 — End: 2021-05-07
  Administered 2021-05-07: 20 mg via ORAL
  Filled 2021-05-07: qty 1

## 2021-05-07 MED ORDER — OXYCODONE HCL 5 MG/5ML PO SOLN: 5.0000 mg | Freq: Once | ORAL | Status: AC | PRN

## 2021-05-07 MED ORDER — FENTANYL CITRATE (PF) 100 MCG/2ML IJ SOLN
INTRAMUSCULAR | Status: AC
  Administered 2021-05-07: 50 ug via INTRAVENOUS
  Filled 2021-05-07: qty 2

## 2021-05-07 MED ORDER — PHENYLEPHRINE HCL-NACL 20-0.9 MG/250ML-% IV SOLN
INTRAVENOUS | Status: AC
  Filled 2021-05-07: qty 250

## 2021-05-07 MED ORDER — BUPIVACAINE-EPINEPHRINE (PF) 0.5% -1:200000 IJ SOLN
INTRAMUSCULAR | Status: AC
  Filled 2021-05-07: qty 30

## 2021-05-07 MED ORDER — PROPOFOL 10 MG/ML IV BOLUS
INTRAVENOUS | Status: DC | PRN
  Administered 2021-05-07: 130 mg via INTRAVENOUS

## 2021-05-07 MED ORDER — LACTATED RINGERS IV SOLN: INTRAVENOUS | Status: DC

## 2021-05-07 MED ORDER — OXYCODONE HCL 5 MG PO TABS
5.0000 mg | ORAL_TABLET | ORAL | Status: DC | PRN
  Administered 2021-05-07 – 2021-05-08 (×2): 10 mg via ORAL
  Filled 2021-05-07 (×2): qty 2

## 2021-05-07 MED ORDER — CELECOXIB 200 MG PO CAPS
200.0000 mg | ORAL_CAPSULE | Freq: Two times a day (BID) | ORAL | Status: DC
  Administered 2021-05-07 (×2): 200 mg via ORAL
  Filled 2021-05-07 (×3): qty 1

## 2021-05-07 MED ORDER — DEXAMETHASONE SODIUM PHOSPHATE 10 MG/ML IJ SOLN
INTRAMUSCULAR | Status: AC
  Filled 2021-05-07: qty 1

## 2021-05-07 MED ORDER — ACETAMINOPHEN 325 MG PO TABS
650.0000 mg | ORAL_TABLET | Freq: Four times a day (QID) | ORAL | Status: DC
  Administered 2021-05-07 – 2021-05-10 (×8): 650 mg via ORAL
  Filled 2021-05-07 (×9): qty 2

## 2021-05-07 MED ORDER — BUPIVACAINE-EPINEPHRINE 0.5% -1:200000 IJ SOLN: INTRAMUSCULAR | Status: DC | PRN

## 2021-05-07 MED ORDER — CEFOTETAN DISODIUM 2 G IJ SOLR
INTRAMUSCULAR | Status: AC
  Filled 2021-05-07: qty 2

## 2021-05-07 MED ORDER — HYDROMORPHONE HCL 1 MG/ML IJ SOLN
INTRAMUSCULAR | Status: AC
  Filled 2021-05-07: qty 1

## 2021-05-07 MED ORDER — ENOXAPARIN SODIUM 40 MG/0.4ML IJ SOSY
40.0000 mg | PREFILLED_SYRINGE | INTRAMUSCULAR | Status: DC
  Administered 2021-05-08 – 2021-05-09 (×2): 40 mg via SUBCUTANEOUS
  Filled 2021-05-07 (×2): qty 0.4

## 2021-05-07 MED ORDER — LORATADINE 10 MG PO TABS
10.0000 mg | ORAL_TABLET | Freq: Every day | ORAL | Status: DC
  Administered 2021-05-07 – 2021-05-10 (×4): 10 mg via ORAL
  Filled 2021-05-07 (×4): qty 1

## 2021-05-07 MED ORDER — MIDAZOLAM HCL 2 MG/2ML IJ SOLN
1.0000 mg | Freq: Once | INTRAMUSCULAR | Status: AC
  Administered 2021-05-07: 1 mg via INTRAVENOUS

## 2021-05-07 MED ORDER — SUGAMMADEX SODIUM 200 MG/2ML IV SOLN
INTRAVENOUS | Status: DC | PRN
  Administered 2021-05-07: 160 mg via INTRAVENOUS

## 2021-05-07 SURGICAL SUPPLY — 60 items
ADH SKN CLS APL DERMABOND .7 (GAUZE/BANDAGES/DRESSINGS) ×1
BLADE SURG SZ11 CARB STEEL (BLADE) ×3 IMPLANT
CANNULA REDUC XI 12-8 STAPL (CANNULA) ×2
CANNULA REDUCER 12-8 DVNC XI (CANNULA) ×2 IMPLANT
DERMABOND ADVANCED (GAUZE/BANDAGES/DRESSINGS) ×1
DERMABOND ADVANCED .7 DNX12 (GAUZE/BANDAGES/DRESSINGS) IMPLANT
DRSG OPSITE POSTOP 4X10 (GAUZE/BANDAGES/DRESSINGS) IMPLANT
DRSG OPSITE POSTOP 4X8 (GAUZE/BANDAGES/DRESSINGS) IMPLANT
ELECT CAUTERY BLADE 6.4 (BLADE) IMPLANT
ELECT REM PT RETURN 9FT ADLT (ELECTROSURGICAL) ×2
ELECTRODE REM PT RTRN 9FT ADLT (ELECTROSURGICAL) ×2 IMPLANT
GLOVE SURG SYN 6.5 ES PF (GLOVE) ×6 IMPLANT
GLOVE SURG SYN 6.5 PF PI (GLOVE) ×6 IMPLANT
GLOVE SURG UNDER POLY LF SZ7 (GLOVE) ×9 IMPLANT
GOWN STRL REUS W/ TWL LRG LVL3 (GOWN DISPOSABLE) ×12 IMPLANT
GOWN STRL REUS W/TWL LRG LVL3 (GOWN DISPOSABLE) ×12
GRASPER SUT TROCAR 14GX15 (MISCELLANEOUS) IMPLANT
IRRIGATION STRYKERFLOW (MISCELLANEOUS) IMPLANT
IRRIGATOR STRYKERFLOW (MISCELLANEOUS)
IV NS 1000ML (IV SOLUTION)
IV NS 1000ML BAXH (IV SOLUTION) IMPLANT
KIT PINK PAD W/HEAD ARE REST (MISCELLANEOUS) ×2
KIT PINK PAD W/HEAD ARM REST (MISCELLANEOUS) ×2 IMPLANT
LABEL OR SOLS (LABEL) IMPLANT
LIGASURE IMPACT 36 18CM CVD LR (INSTRUMENTS) ×1 IMPLANT
NDL INSUFFLATION 14GA 120MM (NEEDLE) ×2 IMPLANT
NEEDLE HYPO 22GX1.5 SAFETY (NEEDLE) ×3 IMPLANT
NEEDLE INSUFFLATION 14GA 120MM (NEEDLE) ×2 IMPLANT
PACK LAP CHOLECYSTECTOMY (MISCELLANEOUS) ×3 IMPLANT
PENCIL ELECTRO HAND CTR (MISCELLANEOUS) ×3 IMPLANT
RELOAD PROXIMATE 75MM BLUE (ENDOMECHANICALS) ×6 IMPLANT
RELOAD STAPLE 45 3.5 BLU DVNC (STAPLE) IMPLANT
RELOAD STAPLE 60 3.5 BLU DVNC (STAPLE) IMPLANT
RELOAD STAPLE 75 3.8 BLU REG (ENDOMECHANICALS) IMPLANT
RELOAD STAPLER 3.5X45 BLU DVNC (STAPLE) IMPLANT
RELOAD STAPLER 3.5X60 BLU DVNC (STAPLE) IMPLANT
SEALER VESSEL DA VINCI XI (MISCELLANEOUS)
SEALER VESSEL EXT DVNC XI (MISCELLANEOUS) IMPLANT
STAPLER 45 DA VINCI SURE FORM (STAPLE)
STAPLER 45 SUREFORM DVNC (STAPLE) IMPLANT
STAPLER 60 DA VINCI SURE FORM (STAPLE)
STAPLER 60 SUREFORM DVNC (STAPLE) IMPLANT
STAPLER PROXIMATE 75MM BLUE (STAPLE) ×1 IMPLANT
STAPLER RELOAD 3.5X45 BLU DVNC (STAPLE)
STAPLER RELOAD 3.5X45 BLUE (STAPLE)
STAPLER RELOAD 3.5X60 BLU DVNC (STAPLE)
STAPLER RELOAD 3.5X60 BLUE (STAPLE)
STAPLER SKIN PROX 35W (STAPLE) IMPLANT
SUT MNCRL AB 4-0 PS2 18 (SUTURE) ×3 IMPLANT
SUT PDS AB 1 CT1 36 (SUTURE) ×2 IMPLANT
SUT SILK 3 0 SH 30 (SUTURE) ×1 IMPLANT
SUT SILK 3-0 (SUTURE) ×1 IMPLANT
SUT VIC AB 3-0 SH 27 (SUTURE) ×4
SUT VIC AB 3-0 SH 27X BRD (SUTURE) ×8 IMPLANT
SUT VICRYL 0 AB UR-6 (SUTURE) ×3 IMPLANT
SYR 30ML LL (SYRINGE) ×6 IMPLANT
SYSTEM WECK SHIELD CLOSURE (TROCAR) IMPLANT
TRAY FOLEY MTR SLVR 16FR STAT (SET/KITS/TRAYS/PACK) ×3 IMPLANT
TROCAR XCEL NON-BLD 5MMX100MML (ENDOMECHANICALS) ×1 IMPLANT
WATER STERILE IRR 500ML POUR (IV SOLUTION) ×3 IMPLANT

## 2021-05-07 NOTE — Interval H&P Note (Signed)
No change. OK to proceed. Discussed risks benefits alternatives again patient still agreeable to proceed, understanding no guarantees, resolution of her symptoms due to her complicated course.

## 2021-05-07 NOTE — Progress Notes (Signed)
Pt not tolerating pain well. Patient unable to relax at this time. Dr. Erenest Rasher notifed. Acknowledged. Orders received. See MAR. ?

## 2021-05-07 NOTE — Anesthesia Preprocedure Evaluation (Addendum)
Anesthesia Evaluation  ?Patient identified by MRN, date of birth, ID band ?Patient awake ? ? ? ?Reviewed: ?Allergy & Precautions, NPO status , Patient's Chart, lab work & pertinent test results ? ?History of Anesthesia Complications ?Negative for: history of anesthetic complications ? ?Airway ?Mallampati: I ? ? ?Neck ROM: Full ? ? ? Dental ?no notable dental hx. ? ?  ?Pulmonary ?asthma , former smoker (quit 2016),  ?  ?Pulmonary exam normal ?breath sounds clear to auscultation ? ? ? ? ? ? Cardiovascular ?Exercise Tolerance: Good ?negative cardio ROS ?Normal cardiovascular exam ?Rhythm:Regular Rate:Normal ? ? ?  ?Neuro/Psych ?PSYCHIATRIC DISORDERS (ADHD) negative neurological ROS ?   ? GI/Hepatic ?SBO ?  ?Endo/Other  ?negative endocrine ROS ? Renal/GU ?negative Renal ROS  ? ?  ?Musculoskeletal ? ? Abdominal ?  ?Peds ? Hematology ?negative hematology ROS ?(+)   ?Anesthesia Other Findings ? ? Reproductive/Obstetrics ? ?  ? ? ? ? ? ? ? ? ? ? ? ? ? ?  ?  ? ? ? ? ? ? ?Anesthesia Physical ?Anesthesia Plan ? ?ASA: 3 ? ?Anesthesia Plan: General  ? ?Post-op Pain Management:   ? ?Induction: Intravenous ? ?PONV Risk Score and Plan: 3 and Ondansetron, Dexamethasone and Treatment may vary due to age or medical condition ? ?Airway Management Planned: Oral ETT ? ?Additional Equipment:  ? ?Intra-op Plan:  ? ?Post-operative Plan: Extubation in OR ? ?Informed Consent: I have reviewed the patients History and Physical, chart, labs and discussed the procedure including the risks, benefits and alternatives for the proposed anesthesia with the patient or authorized representative who has indicated his/her understanding and acceptance.  ? ? ? ?Dental advisory given ? ?Plan Discussed with: CRNA ? ?Anesthesia Plan Comments: (Patient consented for risks of anesthesia including but not limited to:  ?- adverse reactions to medications ?- damage to eyes, teeth, lips or other oral mucosa ?- nerve damage due to  positioning  ?- sore throat or hoarseness ?- damage to heart, brain, nerves, lungs, other parts of body or loss of life ? ?Informed patient about role of CRNA in peri- and intra-operative care.  Patient voiced understanding.)  ? ? ? ? ? ? ?Anesthesia Quick Evaluation ? ?

## 2021-05-07 NOTE — Anesthesia Postprocedure Evaluation (Signed)
Anesthesia Post Note ? ?Patient: Paula Cooley ? ?Procedure(s) Performed: OPEN SMALL BOWEL RESECTION ? ?Patient location during evaluation: PACU ?Anesthesia Type: General ?Level of consciousness: awake and alert, oriented and patient cooperative ?Pain management: pain level controlled ?Vital Signs Assessment: post-procedure vital signs reviewed and stable ?Respiratory status: spontaneous breathing, nonlabored ventilation and respiratory function stable ?Cardiovascular status: blood pressure returned to baseline and stable ?Postop Assessment: adequate PO intake ?Anesthetic complications: no ? ? ?No notable events documented. ? ? ?Last Vitals:  ?Vitals:  ? 05/07/21 1130 05/07/21 1131  ?BP: 112/79 112/79  ?Pulse: 65 64  ?Resp: 15 14  ?Temp:    ?SpO2: 100% 100%  ?  ?Last Pain:  ?Vitals:  ? 05/07/21 1130  ?TempSrc:   ?PainSc: Asleep  ? ? ?  ?  ?  ?  ?  ?  ? ?Darrin Nipper ? ? ? ? ?

## 2021-05-07 NOTE — Anesthesia Procedure Notes (Signed)
Procedure Name: Intubation ?Date/Time: 05/07/2021 8:38 AM ?Performed by: Jonna Clark, CRNA ?Pre-anesthesia Checklist: Patient identified, Patient being monitored, Timeout performed, Emergency Drugs available and Suction available ?Patient Re-evaluated:Patient Re-evaluated prior to induction ?Oxygen Delivery Method: Circle system utilized ?Preoxygenation: Pre-oxygenation with 100% oxygen ?Induction Type: IV induction and Cricoid Pressure applied ?Ventilation: Mask ventilation without difficulty ?Laryngoscope Size: Mac and 3 ?Grade View: Grade I ?Tube type: Oral ?Tube size: 7.0 mm ?Number of attempts: 1 ?Placement Confirmation: ETT inserted through vocal cords under direct vision, positive ETCO2 and breath sounds checked- equal and bilateral ?Secured at: 21 cm ?Tube secured with: Tape ?Dental Injury: Teeth and Oropharynx as per pre-operative assessment  ? ? ? ? ?

## 2021-05-07 NOTE — Transfer of Care (Signed)
Immediate Anesthesia Transfer of Care Note ? ?Patient: Paula Cooley ? ?Procedure(s) Performed: OPEN SMALL BOWEL RESECTION ? ?Patient Location: PACU ? ?Anesthesia Type:General ? ?Level of Consciousness: drowsy and patient cooperative ? ?Airway & Oxygen Therapy: Patient Spontanous Breathing and Patient connected to nasal cannula oxygen ? ?Post-op Assessment: Report given to RN and Post -op Vital signs reviewed and stable ? ?Post vital signs: Reviewed and stable ? ?Last Vitals:  ?Vitals Value Taken Time  ?BP 112/71 05/07/21 1035  ?Temp    ?Pulse 68 05/07/21 1035  ?Resp 11 05/07/21 1035  ?SpO2 100 % 05/07/21 1035  ?Vitals shown include unvalidated device data. ? ?Last Pain:  ?Vitals:  ? 05/07/21 0719  ?TempSrc: Tympanic  ?PainSc: 0-No pain  ?   ? ?  ? ?Complications: No notable events documented. ?

## 2021-05-08 ENCOUNTER — Encounter: Payer: Self-pay | Admitting: Surgery

## 2021-05-08 LAB — BASIC METABOLIC PANEL
Anion gap: 7 (ref 5–15)
BUN: 5 mg/dL — ABNORMAL LOW (ref 6–20)
CO2: 26 mmol/L (ref 22–32)
Calcium: 8.8 mg/dL — ABNORMAL LOW (ref 8.9–10.3)
Chloride: 106 mmol/L (ref 98–111)
Creatinine, Ser: 0.59 mg/dL (ref 0.44–1.00)
GFR, Estimated: >60 mL/min (ref >60)
Glucose, Bld: 93 mg/dL (ref 70–99)
Potassium: 3.5 mmol/L (ref 3.5–5.1)
Sodium: 139 mmol/L (ref 135–145)

## 2021-05-08 LAB — CBC
HCT: 36.4 % (ref 36.0–46.0)
Hemoglobin: 12.3 g/dL (ref 12.0–15.0)
MCH: 29.4 pg (ref 26.0–34.0)
MCHC: 33.8 g/dL (ref 30.0–36.0)
MCV: 87.1 fL (ref 80.0–100.0)
Platelets: 279 10*3/uL (ref 150–400)
RBC: 4.18 MIL/uL (ref 3.87–5.11)
RDW: 12.6 % (ref 11.5–15.5)
WBC: 12.8 10*3/uL — ABNORMAL HIGH (ref 4.0–10.5)
nRBC: 0 % (ref 0.0–0.2)

## 2021-05-08 LAB — SURGICAL PATHOLOGY

## 2021-05-08 MED ORDER — HYDROMORPHONE HCL 1 MG/ML IJ SOLN
1.0000 mg | INTRAMUSCULAR | Status: DC | PRN
  Administered 2021-05-08 – 2021-05-10 (×13): 1 mg via INTRAVENOUS
  Filled 2021-05-08 (×14): qty 1

## 2021-05-08 MED ORDER — KETOROLAC TROMETHAMINE 30 MG/ML IJ SOLN
30.0000 mg | Freq: Three times a day (TID) | INTRAMUSCULAR | Status: DC
  Administered 2021-05-08 – 2021-05-10 (×7): 30 mg via INTRAVENOUS
  Filled 2021-05-08 (×7): qty 1

## 2021-05-08 MED ORDER — MORPHINE SULFATE (PF) 2 MG/ML IV SOLN
1.0000 mg | INTRAVENOUS | Status: DC | PRN
  Administered 2021-05-08 (×2): 1 mg via INTRAVENOUS
  Filled 2021-05-08 (×2): qty 1

## 2021-05-08 MED ORDER — SIMETHICONE 80 MG PO CHEW
80.0000 mg | CHEWABLE_TABLET | Freq: Four times a day (QID) | ORAL | Status: DC
  Administered 2021-05-08 – 2021-05-10 (×7): 80 mg via ORAL
  Filled 2021-05-08 (×7): qty 1

## 2021-05-08 NOTE — Progress Notes (Signed)
Subjective:  ?CC: ?Paula Cooley is a 31 y.o. female  Hospital stay day 1, 1 Day Post-Op ex-lap. SB resection ? ?HPI: ?Pain control issues overnight, mostly at incision site.  Tolerating diet otherwise with clears. Couple BM.   ? ?ROS:  ?General: Denies weight loss, weight gain, fatigue, fevers, chills, and night sweats. ?Heart: Denies chest pain, palpitations, racing heart, irregular heartbeat, leg pain or swelling, and decreased activity tolerance. ?Respiratory: Denies breathing difficulty, shortness of breath, wheezing, cough, and sputum. ?GI: Denies change in appetite, heartburn, nausea, vomiting, constipation, diarrhea, and blood in stool. ?GU: Denies difficulty urinating, pain with urinating, urgency, frequency, blood in urine. ? ? ?Objective:  ? ?Temp:  [97.9 ?F (36.6 ?C)-98.4 ?F (36.9 ?C)] 98.4 ?F (36.9 ?C) (03/29 0744) ?Pulse Rate:  [74-78] 76 (03/29 0744) ?Resp:  [17-18] 18 (03/29 0744) ?BP: (106-115)/(66-72) 112/72 (03/29 0744) ?SpO2:  [99 %-100 %] 99 % (03/29 0744)     Height: 5' 6"  (167.6 cm) Weight: 78.5 kg BMI (Calculated): 27.95  ? ?Intake/Output this shift:  ? ?Intake/Output Summary (Last 24 hours) at 05/08/2021 1409 ?Last data filed at 05/08/2021 1033 ?Gross per 24 hour  ?Intake 1098.75 ml  ?Output --  ?Net 1098.75 ml  ? ? ?Constitutional :  alert, cooperative, appears stated age, and no distress  ?Respiratory:  clear to auscultation bilaterally  ?Cardiovascular:  regular rate and rhythm  ?Gastrointestinal: Soft, no guarding, focal TTP over midline incision with some bruising noted but no obvious discharge, erythema . No distention  ?Skin: Cool and moist.   ?Psychiatric: Normal affect, non-agitated, not confused  ?   ?  ?LABS:  ? ?  Latest Ref Rng & Units 05/08/2021  ?  3:25 AM 05/07/2021  ?  1:26 PM 04/12/2021  ?  4:54 AM  ?CMP  ?Glucose 70 - 99 mg/dL 93    86    ?BUN 6 - 20 mg/dL 5    5    ?Creatinine 0.44 - 1.00 mg/dL 0.59   0.89   0.61    ?Sodium 135 - 145 mmol/L 139    139    ?Potassium 3.5 - 5.1  mmol/L 3.5    4.0    ?Chloride 98 - 111 mmol/L 106    107    ?CO2 22 - 32 mmol/L 26    25    ?Calcium 8.9 - 10.3 mg/dL 8.8    9.1    ? ? ?  Latest Ref Rng & Units 05/08/2021  ?  3:25 AM 05/07/2021  ?  1:26 PM 04/12/2021  ?  4:54 AM  ?CBC  ?WBC 4.0 - 10.5 K/uL 12.8   13.0   4.6    ?Hemoglobin 12.0 - 15.0 g/dL 12.3   12.5   13.0    ?Hematocrit 36.0 - 46.0 % 36.4   37.7   38.6    ?Platelets 150 - 400 K/uL 279   257   258    ? ? ?RADS: ?N/a ?Assessment:  ? 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 ?Tolerating clears so advance to full liquid.  Add simethicone.  Will increase dilaudid dose and add toradol scheduled for better incisional pain control.  Continue to monitor. ? ?labs/images/medications/previous chart entries reviewed personally and relevant changes/updates noted above. ? ? ?

## 2021-05-08 NOTE — Progress Notes (Signed)
Patient alert and oriented x4. Complained of abdominal pain, medicated with ordered Dilaudid with short term relief. Patient passing flatus, voiding spontaneously, ambulating in room. No acute distress noted. Will continue to monitor. ?

## 2021-05-08 NOTE — Op Note (Addendum)
Preoperative diagnosis: recurrent SBO ?Postoperative diagnosis: same ? ?Procedure: exploratory laparotomy, lysis of adhesions, small bowel anastamosis resection and re-anastamosis ?  ?Anesthesia: GETA ?  ?Surgeon: Benjamine Sprague ?  ?Wound Classification: clean contaminated ?  ?Specimen: small bowel anastamosis ?  ?Complications: None ?  ?Estimated Blood Loss: 65m ? ? ?Indications: Patient is a 31y.o. female  presented with above.  Please see H&P for further details.   ?  ?FIndings: ?1.  two single adhesions of anastamosis site to abdominal wall ?2. Chronic, mildly dilated proximal limb at anastamosis site ?3. No other obvious pathology ?  ?Description of procedure: The patient was placed on the operating table in the supine position. General anesthesia was induced. A time-out was completed verifying correct patient, procedure, site, positioning, and implant(s) and/or special equipment prior to beginning this procedure. The abdomen was prepped and draped in the usual sterile fashion.  ?  ?Midline lower abdominal incision made through previous incision scar and abdominal cavity entered. Examination of the abdominal cavity noted two single adhesion bands attaching the previously created anastomosis to abdominal wall.  The proximal bowel segment was noted to be slightly more dilated compared to surrounding bowel.  No other obvious pathology noted.  As previously discussed with patient, we proceeded with resection and creation of new anastomosis. ? ?A window was made in the mesentery using proximal and distal to anastomosis. 75 mm stapler with a blue load was then fired to transect the bowel at both ends. The mesentery was then ligated using ligasure, anastomosis removed off operative field.  Enterotomies created at end of staple line on anti-mesenteric border and the 791mstapler used to create a side-to-side anastomosis.  Enterotomies then closed  with another load of GIA stapler.  A patent anastomosis was confirmed  prior to closing the enterotomy site.  The mesenteric defect was then approximated using 2-0 Vicryl, and the staple lines reinforced with Lembert sutured using 3-0 silk in a running fashion.  No enteric spillage noted during entire portion of procedure. ? ?Anastomosis noted to have adequate hemostasis along with no other additional areas of bleeding.  1 PDS x2 in a running fashion was used to close the midline fascia, ensuring no bowel involvement. Skin was then approximated using 3-0 vicryl for deep dermal layer, running 4-0 monocryl in a subcuticular fasion.  Dermabond to dress incision. ?  ?The patient tolerated the procedure well, awakened from anesthesia and was taken to the postanesthesia care unit in satisfactory condition.  Sponge count and instrument count correct at the end of the procedure. ?

## 2021-05-08 NOTE — TOC Initial Note (Signed)
Transition of Care (TOC) - Initial/Assessment Note  ? ? ?Patient Details  ?Name: Paula Cooley ?MRN: [id number] ?Date of Birth: 03-11-1990 ? ?Transition of Care (TOC) CM/SW Contact:    ?Beverly Sessions, RN ?Phone Number: ?05/08/2021, 9:27 AM ? ?Clinical Narrative:                 ? ? ?  ?  ?Transition of Care (TOC) Screening Note ? ? ?Patient Details  ?Name: Paula Cooley ?Date of Birth: 11/07/1990 ? ? ?Transition of Care (TOC) CM/SW Contact:    ?Beverly Sessions, RN ?Phone Number: ?05/08/2021, 9:27 AM ? ? ? ?Transition of Care Department [facility]) has reviewed patient and no TOC needs have been identified at this time. We will continue to monitor patient advancement through interdisciplinary progression rounds. If new patient transition needs arise, please place a TOC consult. ?  ? ? ?Patient Goals and CMS Choice ?  ?  ?  ? ?Expected Discharge Plan and Services ?  ?  ?  ?  ?  ?                ?  ?  ?  ?  ?  ?  ?  ?  ?  ?  ? ?Prior Living Arrangements/Services ?  ?  ?  ?       ?  ?  ?  ?  ? ?Activities of Daily Living ?Home Assistive Devices/Equipment: None ?ADL Screening (condition at time of admission) ?Patient's cognitive ability adequate to safely complete daily activities?: Yes ?Is the patient deaf or have difficulty hearing?: No ?Does the patient have difficulty seeing, even when wearing glasses/contacts?: No ?Does the patient have difficulty concentrating, remembering, or making decisions?: No ?Patient able to express need for assistance with ADLs?: Yes ?Does the patient have difficulty dressing or bathing?: No ?Independently performs ADLs?: Yes (appropriate for developmental age) ?Does the patient have difficulty walking or climbing stairs?: No ?Weakness of Legs: None ?Weakness of Arms/Hands: None ? ?Permission Sought/Granted ?  ?  ?   ?   ?   ?   ? ?Emotional Assessment ?  ?  ?  ?  ?  ?  ? ?Admission diagnosis:  SBO (small bowel obstruction) ([place]) [K56.609] ?Patient Active Problem List  ? Diagnosis Date Noted  ?  Leukocytosis 04/10/2021  ? Small bowel obstruction ([place]) 04/09/2021  ? SBO (small bowel obstruction) ([place]) 04/28/2020  ? Perforated Meckel's diverticulum   ? Intractable pain   ? Intractable vomiting   ? Abdominal pain 12/28/2018  ? Labral tear of shoulder, degenerative, right 04/02/2016  ? Chronic asthma 07/13/2013  ? Exercise-induced asthma 07/13/2013  ? Allergic rhinitis, cause unspecified 06/30/2011  ? ?PCP:  Pcp, No ?Pharmacy:   ?[id number] [id number]?Phone: [phone]: [phone]? ?[id number] [id number]: [phone]: [phone] ? ? ? ?Social Determinants of Health (SDOH) Interventions ?  ? ?Readmission Risk Interventions ?   ? View : No data to display.  ?  ?  ?  ? ? ? ?

## 2021-05-09 LAB — BASIC METABOLIC PANEL
Anion gap: 8 (ref 5–15)
BUN: <5 mg/dL — ABNORMAL LOW (ref 6–20)
CO2: 26 mmol/L (ref 22–32)
Calcium: 8.6 mg/dL — ABNORMAL LOW (ref 8.9–10.3)
Chloride: 103 mmol/L (ref 98–111)
Creatinine, Ser: 0.63 mg/dL (ref 0.44–1.00)
GFR, Estimated: >60 mL/min (ref >60)
Glucose, Bld: 81 mg/dL (ref 70–99)
Potassium: 3.4 mmol/L — ABNORMAL LOW (ref 3.5–5.1)
Sodium: 137 mmol/L (ref 135–145)

## 2021-05-09 LAB — CBC
HCT: 35.2 % — ABNORMAL LOW (ref 36.0–46.0)
Hemoglobin: 11.7 g/dL — ABNORMAL LOW (ref 12.0–15.0)
MCH: 28.9 pg (ref 26.0–34.0)
MCHC: 33.2 g/dL (ref 30.0–36.0)
MCV: 86.9 fL (ref 80.0–100.0)
Platelets: 241 10*3/uL (ref 150–400)
RBC: 4.05 MIL/uL (ref 3.87–5.11)
RDW: 12.4 % (ref 11.5–15.5)
WBC: 6.8 10*3/uL (ref 4.0–10.5)
nRBC: 0 % (ref 0.0–0.2)

## 2021-05-09 LAB — URINALYSIS, ROUTINE W REFLEX MICROSCOPIC
Bilirubin Urine: NEGATIVE
Glucose, UA: NEGATIVE mg/dL
Ketones, ur: NEGATIVE mg/dL
Leukocytes,Ua: NEGATIVE
Nitrite: NEGATIVE
Protein, ur: NEGATIVE mg/dL
Specific Gravity, Urine: 1.002 — ABNORMAL LOW (ref 1.005–1.030)
pH: 6 (ref 5.0–8.0)

## 2021-05-09 NOTE — Progress Notes (Signed)
Subjective:  ?CC: ?Paula Cooley is a 31 y.o. female  Hospital stay day 2, 2 Days Post-Op ex-lap. SB resection ? ?HPI: ?Slightly better pain today at midline incision.  Otherwise continue to have watery stools with a full liquid diet denies any nausea. ? ?Now is complaining of urinary hesitancy and difficulty initiating a bowel movement. ? ?ROS:  ?General: Denies weight loss, weight gain, fatigue, fevers, chills, and night sweats. ?Heart: Denies chest pain, palpitations, racing heart, irregular heartbeat, leg pain or swelling, and decreased activity tolerance. ?Respiratory: Denies breathing difficulty, shortness of breath, wheezing, cough, and sputum. ?GI: Denies change in appetite, heartburn, nausea, vomiting, constipation, and blood in stool. ?GU: Denies pain with urinating, urgency, frequency, blood in urine. ? ? ?Objective:  ? ?Temp:  [97.4 ?F (36.3 ?C)-98.2 ?F (36.8 ?C)] 98.2 ?F (36.8 ?C) (03/30 1528) ?Pulse Rate:  [72-78] 77 (03/30 1528) ?Resp:  [17-20] 18 (03/30 1528) ?BP: (103-118)/(58-76) 103/58 (03/30 1528) ?SpO2:  [99 %-100 %] 100 % (03/30 1528)     Height: 5' 6"  (167.6 cm) Weight: 78.5 kg BMI (Calculated): 27.95  ? ?Intake/Output this shift:  ? ?Intake/Output Summary (Last 24 hours) at 05/09/2021 1532 ?Last data filed at 05/09/2021 0900 ?Gross per 24 hour  ?Intake 960 ml  ?Output 4 ml  ?Net 956 ml  ? ? ?Constitutional :  alert, cooperative, appears stated age, and no distress  ?Respiratory:  clear to auscultation bilaterally  ?Cardiovascular:  regular rate and rhythm  ?Gastrointestinal: Soft, no guarding, focal TTP over midline incision with some bruising noted, now with scant old hematoma like output in between the suture lines.  No obvious skin separation. . No distention  ?Skin: Cool and moist.   ?Psychiatric: Normal affect, non-agitated, not confused  ?   ?  ?LABS:  ? ?  Latest Ref Rng & Units 05/09/2021  ?  3:16 AM 05/08/2021  ?  3:25 AM 05/07/2021  ?  1:26 PM  ?CMP  ?Glucose 70 - 99 mg/dL 81   93      ?BUN 6 - 20 mg/dL <5   5     ?Creatinine 0.44 - 1.00 mg/dL 0.63   0.59   0.89    ?Sodium 135 - 145 mmol/L 137   139     ?Potassium 3.5 - 5.1 mmol/L 3.4   3.5     ?Chloride 98 - 111 mmol/L 103   106     ?CO2 22 - 32 mmol/L 26   26     ?Calcium 8.9 - 10.3 mg/dL 8.6   8.8     ? ? ?  Latest Ref Rng & Units 05/09/2021  ?  3:16 AM 05/08/2021  ?  3:25 AM 05/07/2021  ?  1:26 PM  ?CBC  ?WBC 4.0 - 10.5 K/uL 6.8   12.8   13.0    ?Hemoglobin 12.0 - 15.0 g/dL 11.7   12.3   12.5    ?Hematocrit 36.0 - 46.0 % 35.2   36.4   37.7    ?Platelets 150 - 400 K/uL 241   279   257    ? ? ?RADS: ?N/a ?Assessment:  ? 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 ?Tolerating diet otherwise so we will advance to regular.  Continue pain control.  UA for the urinary hesitancy, and issues with initiating bowel movements hopefully resolve once stools become more formed with regular food.  We will continue to monitor closely. ? ? ?labs/images/medications/previous chart entries reviewed personally and  relevant changes/updates noted above. ? ? ?

## 2021-05-10 LAB — CBC
HCT: 35.7 % — ABNORMAL LOW (ref 36.0–46.0)
Hemoglobin: 12.1 g/dL (ref 12.0–15.0)
MCH: 29.3 pg (ref 26.0–34.0)
MCHC: 33.9 g/dL (ref 30.0–36.0)
MCV: 86.4 fL (ref 80.0–100.0)
Platelets: 245 10*3/uL (ref 150–400)
RBC: 4.13 MIL/uL (ref 3.87–5.11)
RDW: 12.2 % (ref 11.5–15.5)
WBC: 6.1 10*3/uL (ref 4.0–10.5)
nRBC: 0 % (ref 0.0–0.2)

## 2021-05-10 MED ORDER — IBUPROFEN 800 MG PO TABS: 800.0000 mg | ORAL_TABLET | Freq: Three times a day (TID) | ORAL | 0 refills | Status: DC | PRN

## 2021-05-10 MED ORDER — ACETAMINOPHEN 325 MG PO TABS: 650.0000 mg | ORAL_TABLET | Freq: Three times a day (TID) | ORAL | 0 refills | Status: AC | PRN

## 2021-05-10 MED ORDER — OXYCODONE-ACETAMINOPHEN 5-325 MG PO TABS
1.0000 | ORAL_TABLET | Freq: Three times a day (TID) | ORAL | 0 refills | Status: AC | PRN
Start: 2021-05-10 — End: 2022-05-10

## 2021-05-10 NOTE — Discharge Instructions (Signed)
Bowel resection, Care After ?This sheet gives you information about how to care for yourself after your procedure. Your health care provider may also give you more specific instructions. If you have problems or questions, contact your health care provider. ?What can I expect after the procedure? ?After your procedure, it is common to have the following: ?Pain in your abdomen, especially in the incision areas. You will be given medicine to control the pain. ?Tiredness. This is a normal part of the recovery process. Your energy level will return to normal over the next several weeks. ?Changes in your bowel movements, such as constipation or needing to go more often. Talk with your health care provider about how to manage this. ?Follow these instructions at home: ?Medicines ? tylenol and advil as needed for discomfort.  Please alternate between the two every four hours as needed for pain.   ? Use narcotics, if prescribed, only when tylenol and motrin is not enough to control pain. ? 325-668m every 8hrs to max of 40031m24hrs (including the 32530mn every norco dose) for the tylenol.   ? Advil up to 800m91mr dose every 8hrs as needed for pain.   ?Do not drive or use heavy machinery while taking prescription pain medicine. ?Do not drink alcohol while taking prescription pain medicine. ?If you were prescribed an antibiotic medicine, use it as told by your health care provider. Do not stop using the antibiotic even if you start to feel better. ?Incision care ? ?  ?Follow instructions from your health care provider about how to take care of your incision areas. Make sure you: ?Keep your incisions clean and dry. ?Wash your hands with soap and water before and after applying medicine to the areas, and before and after changing your bandage (dressing). If soap and water are not available, use hand sanitizer. ?Change your dressing as told by your health care provider. ?Leave stitches (sutures), skin glue, or adhesive strips  in place. These skin closures may need to stay in place for 2 weeks or longer. If adhesive strip edges start to loosen and curl up, you may trim the loose edges. Do not remove adhesive strips completely unless your health care provider tells you to do that. ?Do not wear tight clothing over the incisions. Tight clothing may rub and irritate the incision areas, which may cause the incisions to open. ?Do not take baths, swim, or use a hot tub until your health care provider approves. OK TO SHOWER.   ?Check your incision area every day for signs of infection. Check for: ?More redness, swelling, or pain. ?More fluid or blood. ?Warmth. ?Pus or a bad smell. ?Activity ?Avoid lifting anything that is heavier than 10 lb (4.5 kg) for 2 weeks or until your health care provider says it is okay. ?You may resume normal activities as told by your health care provider. Ask your health care provider what activities are safe for you. ?Take rest breaks during the day as needed. ?Eating and drinking ?Follow instructions from your health care provider about what you can eat after surgery. ?To prevent or treat constipation while you are taking prescription pain medicine, your health care provider may recommend that you: ?Drink enough fluid to keep your urine clear or pale yellow. ?Take over-the-counter or prescription medicines. ?Eat foods that are high in fiber, such as fresh fruits and vegetables, whole grains, and beans. ?Limit foods that are high in fat and processed sugars, such as fried and sweet foods. ?General instructions ?Ask your  health care provider when you will need an appointment to get your sutures or staples removed. ?Keep all follow-up visits as told by your health care provider. This is important. ?Contact a health care provider if: ?You have more redness, swelling, or pain around your incisions. ?You have more fluid or blood coming from the incisions. ?Your incisions feel warm to the touch. ?You have pus or a bad smell  coming from your incisions or your dressing. ?You have a fever. ?You have an incision that breaks open (edges not staying together) after sutures or staples have been removed. ?Get help right away if: ?You develop a rash. ?You have chest pain or difficulty breathing. ?You have pain or swelling in your legs. ?You feel light-headed or you faint. ?Your abdomen swells (becomes distended). ?You have nausea or vomiting. ?You have blood in your stool (feces). ?This information is not intended to replace advice given to you by your health care provider. Make sure you discuss any questions you have with your health care provider. ?Document Released: 08/16/2004 Document Revised: 10/16/2017 Document Reviewed: 10/29/2015 ?Elsevier Interactive Patient Education ? 2019 [place]. ?  ? ?

## 2021-05-10 NOTE — Progress Notes (Signed)
DISCHARGE NOTE  ? ?Patient discharged home via private vehicle with mother. Both IVs removed, with no swelling or redness to the site. Discharge instructions were reviewed with verbal understanding.  ?

## 2021-05-10 NOTE — Discharge Summary (Signed)
Physician Discharge Summary  ?Patient ID: ?Paula Cooley ?MRN: [id number] ?DOB/AGE: [birth year] 31 y.o. ? ?Admit date: 05/07/2021 ?Discharge date: 05/10/21 ? ?Admission Diagnoses: SBO ? ?Discharge Diagnoses:  ?Same as above ? ?Discharged Condition: fair ? ?Hospital Course: admitted for above. Underwent surgery.  Please see op note for details.  Post op, some incisional pain issues which eventually improved over time.  At time of d/c, tolerating diet and pain manageable to where pt comfortable going home with oral meds to continue recovery. ? ?Consults: None ? ?Discharge Exam: ?Blood pressure 112/70, pulse 69, temperature 98.8 ?F (37.1 ?C), temperature source Oral, resp. rate 18, height 5' 6"  (1.676 m), weight 78.5 kg, SpO2 100 %. ?General appearance: alert, cooperative, and no distress ?GI: soft, no guarding, focal TTP around incision which remains c/d/i ? ?Disposition:  ?Discharge disposition: 01-Home or Self Care ? ? ? ? ? ? ?Discharge Instructions   ? ? Discharge patient   Complete by: As directed ?  ? Discharge disposition: 01-Home or Self Care  ? Discharge patient date: 05/10/2021  ? ?  ? ?Allergies as of 05/10/2021   ?No Known Allergies ?  ? ?  ?Medication List  ?  ? ?STOP taking these medications   ? ?Linzess 145 MCG Caps capsule ?Generic drug: linaclotide ?  ?traMADol 50 MG tablet ?Commonly known as: Ultram ?  ? ?  ? ?TAKE these medications   ? ?acetaminophen 325 MG tablet ?Commonly known as: Tylenol ?Take 2 tablets (650 mg total) by mouth every 8 (eight) hours as needed for mild pain. ?  ?amphetamine-dextroamphetamine 20 MG tablet ?Commonly known as: Adderall ?Take 1 tablet (20 mg total) by mouth 2 (two) times daily. ?  ?cetirizine 10 MG tablet ?Commonly known as: ZYRTEC ?Take 10 mg by mouth daily. ?  ?FIBER-CAPS PO ?Take 5 capsules by mouth 2 (two) times daily. ?  ?ibuprofen 800 MG tablet ?Commonly known as: ADVIL ?Take 1 tablet (800 mg total) by mouth every 8 (eight) hours as needed for mild pain or moderate  pain. ?  ?ondansetron 4 MG tablet ?Commonly known as: Zofran ?Take 1 tablet (4 mg total) by mouth daily as needed for nausea or vomiting. ?  ?oxyCODONE-acetaminophen 5-325 MG tablet ?Commonly known as: Percocet ?Take 1-2 tablets by mouth every 8 (eight) hours as needed for severe pain. ?  ?PROBIOTIC PO ?Take 1 capsule by mouth daily. ?  ? ?  ? ? Follow-up Information   ? ? Nyisha Clippard, DO. Go on 05/22/2021.   ?Specialty: Surgery ?Why: post op small bowel resection 9:45 am appointment ?Contact information: ?[address] ?[place] [address] ?[phone] ? ? ?  ?  ? ?  ?  ? ?  ? ? ? ?Total time spent arranging discharge was >80mn. ?Signed: ?IBenjamine Sprague?05/10/2021, 2:53 PM ? ?

## 2021-05-10 NOTE — Plan of Care (Signed)
?  Problem: Clinical Measurements: ?Goal: Will remain free from infection ?Outcome: Progressing ?  ?Problem: Nutrition: ?Goal: Adequate nutrition will be maintained ?Outcome: Progressing ?  ?Problem: Pain Managment: ?Goal: General experience of comfort will improve ?Outcome: Progressing ?  ?

## 2022-04-24 LAB — TSH: TSH: 3.53 (ref 0.41–5.90)

## 2022-07-14 LAB — COMPREHENSIVE METABOLIC PANEL: eGFR: 101

## 2022-07-14 LAB — BASIC METABOLIC PANEL
BUN: 12 (ref 4–21)
Creatinine: 0.8 (ref 0.5–1.1)
Glucose: 64

## 2023-03-20 NOTE — Patient Instructions (Signed)
 Hypoglycemia Hypoglycemia is when the amount of sugar, or glucose, in your blood is too low. Low blood sugar can happen if you have diabetes or if you don't have diabetes. It may be an emergency. What are the causes? Low blood sugar happens most often in people who have diabetes. It may be caused by: Diabetes medicine. Not eating enough, or not eating often enough. Being more active than normal. If you don't have diabetes, you may still get low blood sugar if: There's a tumor in your pancreas. A tumor is a growth of cells that isn't normal. You don't eat enough, or you fast. Fasting is when you don't eat for long periods at a time. You have a bad infection or illness. You have problems after weight loss surgery. You have kidney or liver problems. You take certain medicines. What increases the risk? You're more likely to have low blood sugar if: You have diabetes and take medicine for it. You drink a lot of alcohol. You get sick. What are the signs or symptoms? Mild Hunger or feeling like you may vomit. Sweating and feeling cold to the touch. Feeling dizzy or light-headed. Being sleepy or having trouble sleeping. A headache. Blurry vision. Mood changes. These include feeling worried, nervous, or easily annoyed. Moderate Feeling confused. Changes in the way you act. Weakness. An uneven heartbeat. Very bad Having very low blood sugar is an emergency. It can cause: Fainting. Seizures. A coma. Death. How is this diagnosed?  Low blood sugar can be found with a blood test. This test tells you how much sugar is in your blood. It's done while you're having symptoms. Your health care provider may also do an exam and look at your medical history. How is this treated? Treating low blood sugar If you have low blood sugar, eat or drink something with sugar in it right away. The food or drink should have 15 grams of a fast-acting carbohydrate (carb). Options include: 4 oz (120 mL) of  fruit juice. 4 oz (120 mL) of soda (not diet soda). A few pieces of hard candy. Check food labels to see how many pieces to eat. 1 Tbsp (15 mL) of sugar or honey. 4 glucose tablets. 1 tube of glucose gel. Treating low blood sugar if you have diabetes Talk with your provider about how much carb you should take. If you're alert and can swallow safely, you may follow the 15:15 rule: Take 15 grams of a fast-acting carb. Check your blood sugar 15 minutes after you take the carb. If your blood sugar is still at or below 70 mg/dL (3.9 mmol/L), take 15 grams of a carb again. If your blood sugar doesn't go above 70 mg/dL (3.9 mmol/L) after 3 tries, get help right away. After your blood sugar goes back to normal, eat a meal or a snack within 1 hour. Always keep 15 grams of a fast-acting carb with you. This could be: 4 glucose tablets. A few pieces of hard candy. 1 Tbsp (15 mL) of honey or sugar. 1 tube of glucose gel. Treating very low blood sugar If your blood sugar is less than 54 mg/dL (3 mmol/L), it's an emergency. Get help right away. If you can't eat or drink, you will need to be given glucagon. A family member or friend should learn how to check your blood sugar and give you glucagon. Ask your provider if you should keep a glucagon kit at home. You may also need to be treated in a hospital. Follow  these instructions at home: If you have diabetes: Always keep a fast-acting carb (15 grams) with you. Follow your diabetes care plan. Make sure you: Know the symptoms of low blood sugar. Check your blood sugar as often as told. Always check it before and after you exercise. Always check your blood sugar before you drive. Take your medicines as told. Eat on time. Do not skip meals. Share your diabetes care plan with: Your work or school. The people you live with. Wear an alert bracelet or carry a card that says you have diabetes. General instructions If you drink alcohol: Limit how much you  have to: 0-1 drink a day if you're female. 0-2 drinks a day if you're female. Know how much alcohol is in your drink. In the [place] one drink is one 12 oz bottle of beer (355 mL), one 5 oz glass of wine (148 mL), or one 1 oz glass of hard liquor (44 mL). Be sure to eat food when you drink alcohol. Be sure to check your blood sugar after you drink. Alcohol may lead to low blood sugar later. Where to find more information American Diabetes Association (ADA): diabetes.org Contact a health care provider if: You have low blood sugar often. You have diabetes and are having trouble keeping your blood sugar in the right range. Get help right away if: You can't get your blood sugar above 70 mg/dL (3.9 mmol/L) after 3 tries. Your blood sugar is below 54 mg/dL (3 mmol/L). You have a seizure. You faint. These symptoms may be an emergency. Call 911 right away. Do not wait to see if the symptoms will go away. Do not drive yourself to the hospital. This information is not intended to replace advice given to you by your health care provider. Make sure you discuss any questions you have with your health care provider. Document Revised: 10/30/2022 Document Reviewed: 04/16/2022 Elsevier Patient Education  2024 [employer].

## 2023-03-23 ENCOUNTER — Encounter: Payer: Self-pay | Admitting: Nurse Practitioner

## 2023-03-23 ENCOUNTER — Ambulatory Visit (INDEPENDENT_AMBULATORY_CARE_PROVIDER_SITE_OTHER): Payer: 59 | Admitting: Nurse Practitioner

## 2023-03-23 VITALS — BP 104/68 | HR 84 | Ht 66.0 in | Wt 186.2 lb

## 2023-03-23 DIAGNOSIS — E162 Hypoglycemia, unspecified: Secondary | ICD-10-CM

## 2023-03-23 MED ORDER — DEXCOM G7 SENSOR MISC: 1.0000 | 3 refills | Status: DC

## 2023-03-23 NOTE — Progress Notes (Signed)
 Endocrinology Consult Note                                            03/23/2023, 12:41 PM   Subjective:    Patient ID: Paula Cooley, female    DOB: [birth year], PCP Pcp, No   Past Medical History:  Diagnosis Date   Acetabular labrum tear    R shoulder    Acne    Asthma    exercise induced, no problems in quite a while, last use of inhaler- >one year   Hypoglycemia    uses glucose tablets PRN   Perforated bowel (HCC) 2020   Seasonal allergies    Past Surgical History:  Procedure Laterality Date   LAPAROSCOPY N/A 12/31/2018   Procedure: LAPAROSCOPY DIAGNOSTIC;  Surgeon: Conrado Delay, DO;  Location: [facility] ORS;  Service: General;  Laterality: N/A;   LAPAROTOMY N/A 12/31/2018   Procedure: EXPLORATORY LAPAROTOMY WITH REDUCTION OF HERNIA;  Surgeon: Conrado Delay, DO;  Location: [facility] ORS;  Service: General;  Laterality: N/A;   ROTATOR CUFF REPAIR Right 07/2011   as also bicep tendon surgery   SHOULDER ARTHROSCOPY WITH LABRAL REPAIR Right 01/01/2016   Procedure: SHOULDER ARTHROSCOPY WITH LABRAL (SLAP) REPAIR;  Surgeon: Jasmine Mesi, MD;  Location: [facility];  Service: Orthopedics;  Laterality: Right;   WISDOM TOOTH EXTRACTION     XI ROBOT ASSISTED DIAGNOSTIC LAPAROSCOPY N/A 12/28/2018   Procedure: XI ROBOT ASSISTED DIAGNOSTIC LAPAROSCOPY;  Surgeon: Conrado Delay, DO;  Location: [facility] ORS;  Service: General;  Laterality: N/A;   XI ROBOTIC ASSISTED SMALL BOWEL RESECTION N/A 05/07/2021   Procedure: OPEN SMALL BOWEL RESECTION;  Surgeon: Conrado Delay, DO;  Location: [facility] ORS;  Service: General;  Laterality: N/A;   Social History   Socioeconomic History   Marital status: Single    Spouse name: Not on file   Number of children: Not on file   Years of education: Not on file   Highest education level: Not on file  Occupational History   Not on file  Tobacco Use   Smoking status: Former    Current packs/day: 0.00    Types: Cigarettes    Quit date: 12/26/2014    Years since  quitting: 8.2   Smokeless tobacco: Never  Vaping Use   Vaping status: Former  Substance and Sexual Activity   Alcohol use: Yes    Comment: 3-5 drinks every other week   Drug use: No   Sexual activity: Yes    Partners: Male    Birth control/protection: Pill  Other Topics Concern   Not on file  Social History Narrative   Graduated 07/2013 from [employer] in [place].  Hoping to go to [employer] in the Fall. Just got engaged   Social Drivers of Corporate investment banker Strain: Not on file  Food Insecurity: Not on file  Transportation Needs: Not on file  Physical Activity: Not on file  Stress: Not on file  Social Connections: Not on file   Family History  Problem Relation Age of Onset   Hypertension Mother    Heart disease Father 57       MI; s/p CABG   Diabetes Father        resolved after weight loss   Cancer Paternal Grandmother        lung   Outpatient Encounter Medications  as of 03/23/2023  Medication Sig   buPROPion (WELLBUTRIN XL) 300 MG 24 hr tablet Take 300 mg by mouth every morning.   Calcium Polycarbophil (FIBER-CAPS PO) Take 5 capsules by mouth 2 (two) times daily.   cetirizine (ZYRTEC) 10 MG tablet Take 10 mg by mouth daily.   citalopram (CELEXA) 20 MG tablet Take 20 mg by mouth daily.   Continuous Glucose Sensor (DEXCOM G7 SENSOR) MISC Inject 1 Application into the skin as directed. Change sensor every 10 days as directed.   Probiotic Product (PROBIOTIC PO) Take 1 capsule by mouth daily.   amphetamine -dextroamphetamine  (ADDERALL) 20 MG tablet Take 1 tablet (20 mg total) by mouth 2 (two) times daily.   ibuprofen  (ADVIL ) 800 MG tablet Take 1 tablet (800 mg total) by mouth every 8 (eight) hours as needed for mild pain or moderate pain.   Facility-Administered Encounter Medications as of 03/23/2023  Medication   indocyanine green  (IC-GREEN ) injection 5 mg   ALLERGIES: No Known Allergies  VACCINATION STATUS: Immunization History  Administered Date(s)  Administered   HPV Quadrivalent 09/07/2007, 11/15/2007, 04/12/2008   Influenza-Unspecified 11/04/2013   PPD Test 02/26/2015   Tdap 10/30/2008    HPI Paula Cooley is 33 y.o. female who presents today with a medical history as above. she is being seen in consultation for hypoglycemia requested by Pcp, No.  she had a low glucose reading on CMP during recent annual exam in June of 2024 at 22.  She notes having episodes of hypoglycemia dating back to high school which was exacerbated by her playing sports in which she did have syncopal episodes with.  She reports low readings in the 40s overnight.  She notes these hypoglycemia episodes have recently been intensified by her intestinal issues.  She does have history of SBO.    She works night shift in a vet clinic.  She drinks water and coffee with creamer, eats 3 meals per day and snacks between when feels low.  She does tend to lead to more carb heavy meals such as pasta and sandwiches.  She also noticed spike in glucose after eating bowl of cereal which then had a significant drop in glucose shortly thereafter.  Her PCP did give her a script for Dexcom G7.  Analysis of her CGM shows TIR 91%, TAR 1%, TBR 8% with a GMI of 5.6%.   Review of systems  Constitutional: + increasing body weight(says as a result of frequent snacking to avoid hypoglycemia),  current Body mass index is 30.05 kg/m. , no fatigue, no subjective hyperthermia, no subjective hypothermia Eyes: no blurry vision, no xerophthalmia ENT: no sore throat, no nodules palpated in throat, no dysphagia/odynophagia, no hoarseness Cardiovascular: no chest pain, no shortness of breath, no palpitations, no leg swelling Respiratory: no cough, no shortness of breath Gastrointestinal: no nausea/vomiting/diarrhea Musculoskeletal: no muscle/joint aches Skin: no rashes, no hyperemia Neurological: no tremors, no numbness, no tingling, no dizziness Psychiatric: no depression, no  anxiety  Objective:    BP Readings from Last 3 Encounters:  03/23/23 104/68  05/10/21 112/70  04/12/21 103/70    BP 104/68 (BP Location: Right Arm, Patient Position: Sitting, Cuff Size: Large)   Pulse 84   Ht 5\' 6"  (1.676 m)   Wt 186 lb 3.2 oz (84.5 kg)   BMI 30.05 kg/m   Wt Readings from Last 3 Encounters:  03/23/23 186 lb 3.2 oz (84.5 kg)  05/07/21 173 lb 1 oz (78.5 kg)  05/03/21 173 lb (78.5 kg)  Physical Exam- Limited  Constitutional:  Body mass index is 30.05 kg/m. , not in acute distress, normal state of mind Eyes:  EOMI, no exophthalmos Neck: Supple Cardiovascular: RRR, no murmurs, rubs, or gallops, no edema Respiratory: Adequate breathing efforts, no crackles, rales, rhonchi, or wheezing Musculoskeletal: no gross deformities, strength intact in all four extremities, no gross restriction of joint movements Skin:  no rashes, no hyperemia Neurological: no tremor with outstretched hands   CMP ( most recent) CMP     Component Value Date/Time   NA 137 05/09/2021 0316   K 3.4 (L) 05/09/2021 0316   CL 103 05/09/2021 0316   CO2 26 05/09/2021 0316   GLUCOSE 81 05/09/2021 0316   BUN 12 07/14/2022 0000   CREATININE 0.8 07/14/2022 0000   CREATININE 0.63 05/09/2021 0316   CALCIUM 8.6 (L) 05/09/2021 0316   PROT 10.0 (H) 04/09/2021 2025   ALBUMIN 5.3 (H) 04/09/2021 2025   AST 24 04/09/2021 2025   ALT 20 04/09/2021 2025   ALKPHOS 82 04/09/2021 2025   BILITOT 0.5 04/09/2021 2025   EGFR 101 07/14/2022 0000   GFRNONAA >60 05/09/2021 0316     Diabetic Labs (most recent): No results found for: "HGBA1C", "MICROALBUR"   Lipid Panel ( most recent) Lipid Panel  No results found for: "CHOL", "TRIG", "HDL", "CHOLHDL", "VLDL", "LDLCALC", "LDLDIRECT", "LABVLDL"    Lab Results  Component Value Date   TSH 3.53 04/24/2022           Assessment & Plan:   1. Hypoglycemia (Primary)  - Paula Cooley  is being seen at a kind request of Pcp, No.  - I have  reviewed her available records and clinically evaluated the patient.  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  We did talk about proper nutrition to avoid hypoglycemia.  I did send in new script for Dexcom G7 for her, with her having hypoglycemia, she certainly needs to continue this.  I did also give her a sample of Stelo just in case insurance denies our request to cover this product.  I did ask her to reach out in a few  I am also checking fasting CMP, insulin  and c-peptide levels, and A1c to help assess for insulin  resistance.  She does note her dad has type 2 diabetes.  The following Lifestyle Medicine recommendations according to American College of Lifestyle Medicine [facility]) were discussed and offered to patient and she agrees to start the journey:  A. Whole Foods, Plant-based plate comprising of fruits and vegetables, plant-based proteins, whole-grain carbohydrates was discussed in detail with the patient.   A list for source of those nutrients were also provided to the patient.  Patient will use only water or unsweetened tea for hydration. B.  The need to stay away from risky substances including alcohol, smoking; obtaining 7 to 9 hours of restorative sleep, at least 150 minutes of moderate intensity exercise weekly, the importance of healthy social connections,  and stress reduction techniques were discussed. C.  A full color page of  Calorie density of various food groups per pound showing examples of each food groups was provided to the patient.  - The patient admits there is a room for improvement in their diet and drink choices. -  Suggestion is made for the patient to avoid simple carbohydrates from their diet including Cakes, Sweet Desserts / Pastries, Ice Cream, Soda (diet and regular),  Sweet Tea, Candies, Chips, Cookies, Sweet Pastries, Store Bought Juices, Alcohol in Excess  of 1-2 drinks a day, Artificial Sweeteners, Coffee Creamer, and "Sugar-free" Products. This will help patient to have stable blood glucose profile and potentially avoid unintended weight gain.   - I encouraged the patient to switch to unprocessed or minimally processed complex starch and increased protein intake (animal or plant source), fruits, and vegetables.   - Patient is advised to stick to a routine mealtimes to eat 3 meals a day and avoid unnecessary snacks (to snack only to correct hypoglycemia).  She did have a CT of her abdomen on 04/09/21 which showed unremarkable pancreas, thus ruling out pancreatic insulinoma.  She does not need any additional imaging studies at this time.   - she is advised to maintain close follow up with Pcp, No for primary care needs.   -Thank you for involving me in the care of this pleasant patient.  Time spent with the patient: 60  minutes, of which >50% was spent in  counseling her about her hypoglycemia and the rest in obtaining information about her symptoms, reviewing her previous labs/studies ( including abstractions from other facilities),  evaluations, and treatments,  and developing a plan to confirm diagnosis and long term treatment based on the latest standards of care/guidelines; and documenting her care.  Paula Cooley participated in the discussions, expressed understanding, and voiced agreement with the above plans.  All questions were answered to her satisfaction. she is encouraged to contact clinic should she have any questions or concerns prior to her return visit.  Follow up plan: Return in about 3 months (around 06/20/2023) for hypoglycemia follow up with previsit labs.   Hulon Magic, [facility] [address] [place], [place] [address] Phone: [phone] Fax: [phone]    03/23/2023, 12:41 PM

## 2023-04-11 LAB — COMPREHENSIVE METABOLIC PANEL
ALT: 17 IU/L (ref 0–32)
AST: 19 IU/L (ref 0–40)
Albumin: 4.5 g/dL (ref 3.9–4.9)
Alkaline Phosphatase: 70 IU/L (ref 44–121)
BUN/Creatinine Ratio: 17 (ref 9–23)
BUN: 12 mg/dL (ref 6–20)
Bilirubin Total: <0.2 mg/dL (ref 0.0–1.2)
CO2: 21 mmol/L (ref 20–29)
Calcium: 9.5 mg/dL (ref 8.7–10.2)
Chloride: 107 mmol/L — ABNORMAL HIGH (ref 96–106)
Creatinine, Ser: 0.7 mg/dL (ref 0.57–1.00)
Globulin, Total: 2.1 g/dL (ref 1.5–4.5)
Glucose: 77 mg/dL (ref 70–99)
Potassium: 4.3 mmol/L (ref 3.5–5.2)
Sodium: 143 mmol/L (ref 134–144)
Total Protein: 6.6 g/dL (ref 6.0–8.5)
eGFR: 119 mL/min/{1.73_m2} (ref >59)

## 2023-04-11 LAB — VITAMIN D 25 HYDROXY (VIT D DEFICIENCY, FRACTURES): Vit D, 25-Hydroxy: 36.5 ng/mL (ref 30.0–100.0)

## 2023-04-11 LAB — HEMOGLOBIN A1C
Est. average glucose Bld gHb Est-mCnc: 105 mg/dL
Hgb A1c MFr Bld: 5.3 % (ref 4.8–5.6)

## 2023-04-11 LAB — INSULIN AND C-PEPTIDE, SERUM
C-Peptide: 4.1 ng/mL (ref 1.1–4.4)
INSULIN: 17.2 u[IU]/mL (ref 2.6–24.9)

## 2023-04-13 ENCOUNTER — Telehealth: Payer: Self-pay | Admitting: *Deleted

## 2023-04-13 ENCOUNTER — Encounter: Payer: Self-pay | Admitting: Nurse Practitioner

## 2023-04-13 NOTE — Telephone Encounter (Signed)
-----   Message from Dani Gobble sent at 04/13/2023  9:00 AM EST ----- FYI: I sent mychart message going over recent labs.

## 2023-04-13 NOTE — Telephone Encounter (Signed)
 NptedAlphonzo Lemmings had sent a message going over the patient's recent lab work.

## 2023-04-13 NOTE — Progress Notes (Signed)
FYI: I sent mychart message going over recent labs.

## 2023-04-30 ENCOUNTER — Telehealth: Payer: Self-pay | Admitting: *Deleted

## 2023-04-30 NOTE — Telephone Encounter (Signed)
 Patient was called and a message was left asking that she call the office for results as she ha not reviewed her My Chart message from [place] on 04/13/23.

## 2023-05-21 ENCOUNTER — Telehealth: Payer: Self-pay | Admitting: *Deleted

## 2023-05-21 NOTE — Telephone Encounter (Signed)
 Patient was called and a message was left on her phone with the results ,as she had not reviewed the note from [facility] in [place]. Ask the patient to call our office back to confirm that she received the message that I left.

## 2023-06-15 ENCOUNTER — Telehealth: Payer: Self-pay | Admitting: Nurse Practitioner

## 2023-06-15 NOTE — Telephone Encounter (Signed)
 Pt did labs in Feb, do you need recent labs and if so can you put in a new order?

## 2023-06-15 NOTE — Telephone Encounter (Signed)
 No she wont need to repeat any this time.

## 2023-06-25 ENCOUNTER — Ambulatory Visit (INDEPENDENT_AMBULATORY_CARE_PROVIDER_SITE_OTHER): Payer: 59 | Admitting: Nurse Practitioner

## 2023-06-25 ENCOUNTER — Encounter: Payer: Self-pay | Admitting: Nurse Practitioner

## 2023-06-25 VITALS — BP 106/68 | HR 107 | Ht 66.0 in | Wt 178.8 lb

## 2023-06-25 DIAGNOSIS — E162 Hypoglycemia, unspecified: Secondary | ICD-10-CM

## 2023-06-25 MED ORDER — DEXCOM G7 SENSOR MISC: 1.0000 | 3 refills | Status: DC

## 2023-06-25 NOTE — Progress Notes (Signed)
 Endocrinology Follow Up Note                                            06/25/2023, 10:43 AM   Subjective:    Patient ID: Paula Cooley, female    DOB: 1990/02/21, PCP Pcp, No   Past Medical History:  Diagnosis Date   Acetabular labrum tear    R shoulder    Acne    Asthma    exercise induced, no problems in quite a while, last use of inhaler- >one year   Hypoglycemia    uses glucose tablets PRN   Perforated bowel (HCC) 2020   Seasonal allergies    Past Surgical History:  Procedure Laterality Date   LAPAROSCOPY N/A 12/31/2018   Procedure: LAPAROSCOPY DIAGNOSTIC;  Surgeon: Conrado Delay, DO;  Location: [facility];  Service: General;  Laterality: N/A;   LAPAROTOMY N/A 12/31/2018   Procedure: EXPLORATORY LAPAROTOMY WITH REDUCTION OF HERNIA;  Surgeon: Conrado Delay, DO;  Location: [facility];  Service: General;  Laterality: N/A;   ROTATOR CUFF REPAIR Right 07/2011   as also bicep tendon surgery   SHOULDER ARTHROSCOPY WITH LABRAL REPAIR Right 01/01/2016   Procedure: SHOULDER ARTHROSCOPY WITH LABRAL (SLAP) REPAIR;  Surgeon: Jasmine Mesi, MD;  Location: [facility];  Service: Orthopedics;  Laterality: Right;   WISDOM TOOTH EXTRACTION     XI ROBOT ASSISTED DIAGNOSTIC LAPAROSCOPY N/A 12/28/2018   Procedure: XI ROBOT ASSISTED DIAGNOSTIC LAPAROSCOPY;  Surgeon: Conrado Delay, DO;  Location: [facility];  Service: General;  Laterality: N/A;   XI ROBOTIC ASSISTED SMALL BOWEL RESECTION N/A 05/07/2021   Procedure: OPEN SMALL BOWEL RESECTION;  Surgeon: Conrado Delay, DO;  Location: [facility];  Service: General;  Laterality: N/A;   Social History   Socioeconomic History   Marital status: Single    Spouse name: Not on file   Number of children: Not on file   Years of education: Not on file   Highest education level: Not on file  Occupational History   Not on file  Tobacco Use   Smoking status: Former    Current packs/day: 0.00    Types: Cigarettes    Quit date: 12/26/2014    Years since  quitting: 8.5   Smokeless tobacco: Never  Vaping Use   Vaping status: Former  Substance and Sexual Activity   Alcohol use: Yes    Comment: 3-5 drinks every other week   Drug use: No   Sexual activity: Yes    Partners: Male    Birth control/protection: Pill  Other Topics Concern   Not on file  Social History Narrative   Graduated 07/2013 from [employer] in [place].  Hoping to go to [employer] in the Fall. Just got engaged   Social Drivers of Corporate investment banker Strain: Not on file  Food Insecurity: Not on file  Transportation Needs: Not on file  Physical Activity: Not on file  Stress: Not on file  Social Connections: Not on file   Family History  Problem Relation Age of Onset   Hypertension Mother    Heart disease Father 56       MI; s/p CABG   Diabetes Father        resolved after weight loss   Cancer Paternal Grandmother        lung   Outpatient Encounter  Medications as of 06/25/2023  Medication Sig   amphetamine -dextroamphetamine  (ADDERALL) 20 MG tablet Take 1 tablet (20 mg total) by mouth 2 (two) times daily.   buPROPion (WELLBUTRIN XL) 300 MG 24 hr tablet Take 300 mg by mouth every morning.   Calcium Polycarbophil (FIBER-CAPS PO) Take 5 capsules by mouth 2 (two) times daily.   cetirizine (ZYRTEC) 10 MG tablet Take 10 mg by mouth daily.   citalopram (CELEXA) 20 MG tablet Take 20 mg by mouth daily.   ibuprofen  (ADVIL ) 800 MG tablet Take 1 tablet (800 mg total) by mouth every 8 (eight) hours as needed for mild pain or moderate pain.   Probiotic Product (PROBIOTIC PO) Take 1 capsule by mouth daily.   [DISCONTINUED] Continuous Glucose Sensor (DEXCOM G7 SENSOR) MISC Inject 1 Application into the skin as directed. Change sensor every 10 days as directed.   Continuous Glucose Sensor (DEXCOM G7 SENSOR) MISC Inject 1 Application into the skin as directed. Change sensor every 10 days as directed.   Facility-Administered Encounter Medications as of 06/25/2023   Medication   indocyanine green  (IC-GREEN ) injection 5 mg   ALLERGIES: No Known Allergies  VACCINATION STATUS: Immunization History  Administered Date(s) Administered   HPV Quadrivalent 09/07/2007, 11/15/2007, 04/12/2008   Influenza-Unspecified 11/04/2013   PPD Test 02/26/2015   Tdap 10/30/2008    HPI Paula Cooley is 33 y.o. female who presents today with a medical history as above. she is being seen in follow up after being seen in consultation for hypoglycemia requested by Pcp, No.  she had a low glucose reading on CMP during recent annual exam in June of 2024 at 9.  She notes having episodes of hypoglycemia dating back to high school which was exacerbated by her playing sports in which she did have syncopal episodes with.  She reports low readings in the 40s overnight.  She notes these hypoglycemia episodes have recently been intensified by her intestinal issues.  She does have history of SBO.    She works night shift in a vet clinic.  She drinks water and coffee with creamer, eats 3 meals per day and snacks between when feels low.  She does tend to lead to more carb heavy meals such as pasta and sandwiches.  She also noticed spike in glucose after eating bowl of cereal which then had a significant drop in glucose shortly thereafter.  Her PCP did give her a script for Dexcom G7.  Analysis of her CGM shows TIR 91%, TAR 1%, TBR 8% with a GMI of 5.6%.  Since her last visit, her hypoglycemia episodes have improved, along with her diet.  She still struggles from time to time but overall is stable.   Review of systems  Constitutional: +decreasing body weight,  current Body mass index is 28.86 kg/m. , no fatigue, no subjective hyperthermia, no subjective hypothermia Eyes: no blurry vision, no xerophthalmia ENT: no sore throat, no nodules palpated in throat, no dysphagia/odynophagia, no hoarseness Cardiovascular: no chest pain, no shortness of breath, no palpitations, no leg  swelling Respiratory: no cough, no shortness of breath Gastrointestinal: no nausea/vomiting/diarrhea Musculoskeletal: no muscle/joint aches Skin: no rashes, no hyperemia Neurological: no tremors, no numbness, no tingling, no dizziness Psychiatric: no depression, no anxiety  Objective:    BP Readings from Last 3 Encounters:  06/25/23 106/68  03/23/23 104/68  05/10/21 112/70    BP 106/68 (BP Location: Right Arm, Patient Position: Sitting, Cuff Size: Large)   Pulse (!) 107   Ht 5\' 6"  (  1.676 m)   Wt 178 lb 12.8 oz (81.1 kg)   BMI 28.86 kg/m   Wt Readings from Last 3 Encounters:  06/25/23 178 lb 12.8 oz (81.1 kg)  03/23/23 186 lb 3.2 oz (84.5 kg)  05/07/21 173 lb 1 oz (78.5 kg)       Physical Exam- Limited  Constitutional:  Body mass index is 28.86 kg/m. , not in acute distress, normal state of mind Eyes:  EOMI, no exophthalmos Musculoskeletal: no gross deformities, strength intact in all four extremities, no gross restriction of joint movements Skin:  no rashes, no hyperemia Neurological: no tremor with outstretched hands   CMP ( most recent) CMP     Component Value Date/Time   NA 143 04/09/2023 0834   K 4.3 04/09/2023 0834   CL 107 (H) 04/09/2023 0834   CO2 21 04/09/2023 0834   GLUCOSE 77 04/09/2023 0834   GLUCOSE 81 05/09/2021 0316   BUN 12 04/09/2023 0834   CREATININE 0.70 04/09/2023 0834   CALCIUM 9.5 04/09/2023 0834   PROT 6.6 04/09/2023 0834   ALBUMIN 4.5 04/09/2023 0834   AST 19 04/09/2023 0834   ALT 17 04/09/2023 0834   ALKPHOS 70 04/09/2023 0834   BILITOT <0.2 04/09/2023 0834   EGFR 119 04/09/2023 0834   GFRNONAA >60 05/09/2021 0316     Diabetic Labs (most recent): Lab Results  Component Value Date   HGBA1C 5.3 04/09/2023     Lipid Panel ( most recent) Lipid Panel  No results found for: "CHOL", "TRIG", "HDL", "CHOLHDL", "VLDL", "LDLCALC", "LDLDIRECT", "LABVLDL"    Lab Results  Component Value Date   TSH 3.53 04/24/2022            Assessment & Plan:   1. Hypoglycemia (Primary)-- reactive  - Paula Cooley  is being seen at a kind request of Pcp, No.  - I have reviewed her available records and clinically evaluated the patient.  - Based on these reviews, she likely has reactive hypoglycemia.  The following Lifestyle Medicine recommendations according to American College of Lifestyle Medicine [facility]) were discussed and offered to patient and she agrees to start the journey:  A. Whole Foods, Plant-based plate comprising of fruits and vegetables, plant-based proteins, whole-grain carbohydrates was discussed in detail with the patient.   A list for source of those nutrients were also provided to the patient.  Patient will use only water or unsweetened tea for hydration. B.  The need to stay away from risky substances including alcohol, smoking; obtaining 7 to 9 hours of restorative sleep, at least 150 minutes of moderate intensity exercise weekly, the importance of healthy social connections,  and stress reduction techniques were discussed. C.  A full color page of  Calorie density of various food groups per pound showing examples of each food groups was provided to the patient.  - The patient admits there is a room for improvement in their diet and drink choices. -  Suggestion is made for the patient to avoid simple carbohydrates from their diet including Cakes, Sweet Desserts / Pastries, Ice Cream, Soda (diet and regular), Sweet Tea, Candies, Chips, Cookies, Sweet Pastries, Store Bought Juices, Alcohol in Excess of 1-2 drinks a day, Artificial Sweeteners, Coffee Creamer, and "Sugar-free" Products. This will help patient to have stable blood glucose profile and potentially avoid unintended weight gain.   - I encouraged the patient to switch to unprocessed or minimally processed complex starch and increased protein intake (animal or plant source), fruits, and vegetables.   -  Patient is advised to stick to a routine  mealtimes to eat 3 meals a day and avoid unnecessary snacks (to snack only to correct hypoglycemia).  She did have a CT of her abdomen on 04/09/21 which showed unremarkable pancreas, thus ruling out pancreatic insulinoma.  She does not need any additional imaging studies at this time.  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  I am sure this will have improved as her diet has improved since initial visit.  I did resend prescription for Dexcom G7.  She has been paying for this out of pocket.  Will see if it generates a PA and we can try to submit documentation to where CGM is effective in preventing severe hypoglycemia.  - she is advised to maintain close follow up with Pcp, No for primary care needs.    I spent  19  minutes in the care of the patient today including review of labs from CMP, Lipids, Thyroid Function, Hematology (current and previous including abstractions from other facilities); face-to-face time discussing  her blood glucose readings/logs, discussing hypoglycemia and hyperglycemia episodes and symptoms, medications doses, her options of short and long term treatment based on the latest standards of care / guidelines;  discussion about incorporating lifestyle medicine;  and documenting the encounter. Risk reduction counseling performed per USPSTF guidelines to reduce obesity and cardiovascular risk factors.     Please refer to Patient Instructions for Blood Glucose Monitoring and Insulin /Medications Dosing Guide"  in media tab for additional information. Please  also refer to " Patient Self Inventory" in the Media  tab for reviewed elements of pertinent patient history.  Paula Cooley participated in the discussions, expressed understanding, and voiced agreement with the above plans.  All questions were answered to her satisfaction. she is encouraged to contact clinic should she have any questions or concerns prior to her return visit.  Follow up plan: Return if  symptoms worsen or fail to improve.   Hulon Magic, [facility] [address] [place], [place] [address] Phone: [phone] Fax: [phone]    06/25/2023, 10:43 AM

## 2023-06-26 ENCOUNTER — Other Ambulatory Visit (HOSPITAL_COMMUNITY): Payer: Self-pay

## 2023-06-26 ENCOUNTER — Telehealth: Payer: Self-pay

## 2023-06-26 NOTE — Telephone Encounter (Signed)
 Pharmacy Patient Advocate Encounter   Received notification from CoverMyMeds that prior authorization for Dexcom G7 sensor is required/requested.   Insurance verification completed.   The patient is insured through [facility] .   Per test claim: PA required; PA submitted to above mentioned insurance via CoverMyMeds Key/confirmation #/EOC [facility] Status is pending

## 2023-07-02 NOTE — Telephone Encounter (Signed)
 Pharmacy Patient Advocate Encounter  Received notification from [employer] that Prior Authorization for dexcom g7 sensor has been DENIED.  See denial reason below. No denial letter attached in CMM. Will attach denial letter to Media tab once received.   PA #/Case ID/Reference #: [id number]

## 2023-07-21 ENCOUNTER — Encounter (HOSPITAL_COMMUNITY): Payer: Self-pay

## 2023-07-21 ENCOUNTER — Emergency Department
Admission: EM | Admit: 2023-07-21 | Discharge: 2023-07-21 | Discharge status: Against Medical Advice | Attending: Emergency Medicine | Admitting: Emergency Medicine

## 2023-07-21 ENCOUNTER — Inpatient Hospital Stay (HOSPITAL_COMMUNITY)
Admission: EM | Admit: 2023-07-21 | Discharge: 2023-07-27 | DRG: 389 | Disposition: A | Discharge status: Home / Self Care | Attending: Internal Medicine | Admitting: Internal Medicine

## 2023-07-21 ENCOUNTER — Other Ambulatory Visit: Payer: Self-pay

## 2023-07-21 DIAGNOSIS — R109 Unspecified abdominal pain: Secondary | ICD-10-CM | POA: Diagnosis not present

## 2023-07-21 DIAGNOSIS — K922 Gastrointestinal hemorrhage, unspecified: Secondary | ICD-10-CM | POA: Diagnosis present

## 2023-07-21 DIAGNOSIS — F909 Attention-deficit hyperactivity disorder, unspecified type: Secondary | ICD-10-CM | POA: Diagnosis present

## 2023-07-21 DIAGNOSIS — E66811 Obesity, class 1: Secondary | ICD-10-CM | POA: Diagnosis present

## 2023-07-21 DIAGNOSIS — R112 Nausea with vomiting, unspecified: Secondary | ICD-10-CM | POA: Insufficient documentation

## 2023-07-21 DIAGNOSIS — R7982 Elevated C-reactive protein (CRP): Secondary | ICD-10-CM | POA: Insufficient documentation

## 2023-07-21 DIAGNOSIS — F411 Generalized anxiety disorder: Secondary | ICD-10-CM | POA: Diagnosis present

## 2023-07-21 DIAGNOSIS — F39 Unspecified mood [affective] disorder: Secondary | ICD-10-CM | POA: Diagnosis present

## 2023-07-21 DIAGNOSIS — R0602 Shortness of breath: Secondary | ICD-10-CM | POA: Diagnosis present

## 2023-07-21 DIAGNOSIS — Z6830 Body mass index (BMI) 30.0-30.9, adult: Secondary | ICD-10-CM

## 2023-07-21 DIAGNOSIS — K56600 Partial intestinal obstruction, unspecified as to cause: Principal | ICD-10-CM | POA: Diagnosis present

## 2023-07-21 DIAGNOSIS — K56609 Unspecified intestinal obstruction, unspecified as to partial versus complete obstruction: Secondary | ICD-10-CM | POA: Diagnosis not present

## 2023-07-21 DIAGNOSIS — Z8249 Family history of ischemic heart disease and other diseases of the circulatory system: Secondary | ICD-10-CM

## 2023-07-21 DIAGNOSIS — Z9049 Acquired absence of other specified parts of digestive tract: Secondary | ICD-10-CM

## 2023-07-21 DIAGNOSIS — Z5321 Procedure and treatment not carried out due to patient leaving prior to being seen by health care provider: Secondary | ICD-10-CM | POA: Insufficient documentation

## 2023-07-21 DIAGNOSIS — Z8719 Personal history of other diseases of the digestive system: Secondary | ICD-10-CM

## 2023-07-21 DIAGNOSIS — Z87891 Personal history of nicotine dependence: Secondary | ICD-10-CM

## 2023-07-21 DIAGNOSIS — Z79899 Other long term (current) drug therapy: Secondary | ICD-10-CM

## 2023-07-21 DIAGNOSIS — Z833 Family history of diabetes mellitus: Secondary | ICD-10-CM

## 2023-07-21 LAB — COMPREHENSIVE METABOLIC PANEL WITH GFR
ALT: 19 U/L (ref 0–44)
AST: 19 U/L (ref 15–41)
Albumin: 4.5 g/dL (ref 3.5–5.0)
Alkaline Phosphatase: 61 U/L (ref 38–126)
Anion gap: 10 (ref 5–15)
BUN: 12 mg/dL (ref 6–20)
CO2: 26 mmol/L (ref 22–32)
Calcium: 9.5 mg/dL (ref 8.9–10.3)
Chloride: 103 mmol/L (ref 98–111)
Creatinine, Ser: 0.76 mg/dL (ref 0.44–1.00)
GFR, Estimated: >60 mL/min (ref >60)
Glucose, Bld: 102 mg/dL — ABNORMAL HIGH (ref 70–99)
Potassium: 3.7 mmol/L (ref 3.5–5.1)
Sodium: 139 mmol/L (ref 135–145)
Total Bilirubin: 0.7 mg/dL (ref 0.0–1.2)
Total Protein: 7.7 g/dL (ref 6.5–8.1)

## 2023-07-21 LAB — CBC
HCT: 41.4 % (ref 36.0–46.0)
Hemoglobin: 14.2 g/dL (ref 12.0–15.0)
MCH: 31 pg (ref 26.0–34.0)
MCHC: 34.3 g/dL (ref 30.0–36.0)
MCV: 90.4 fL (ref 80.0–100.0)
Platelets: 227 10*3/uL (ref 150–400)
RBC: 4.58 MIL/uL (ref 3.87–5.11)
RDW: 12.5 % (ref 11.5–15.5)
WBC: 5.6 10*3/uL (ref 4.0–10.5)
nRBC: 0 % (ref 0.0–0.2)

## 2023-07-21 LAB — LIPASE, BLOOD: Lipase: 33 U/L (ref 11–51)

## 2023-07-21 MED ORDER — MORPHINE SULFATE (PF) 4 MG/ML IV SOLN
4.0000 mg | Freq: Once | INTRAVENOUS | Status: AC
  Administered 2023-07-21: 4 mg via INTRAVENOUS
  Filled 2023-07-21: qty 1

## 2023-07-21 MED ORDER — ONDANSETRON HCL 4 MG/2ML IJ SOLN
4.0000 mg | Freq: Once | INTRAMUSCULAR | Status: AC
  Administered 2023-07-21: 4 mg via INTRAVENOUS
  Filled 2023-07-21: qty 2

## 2023-07-21 NOTE — ED Triage Notes (Addendum)
 Pt reports lower abdominal pain "lower, behind my belly button", onset today around 1900, associated with vomiting. Hx of multiple abdomen surgeries.   Pt just left [facility] ED tonight due to long lobby wait times. She had blood work done there and has resulted in epic.

## 2023-07-21 NOTE — ED Provider Notes (Signed)
 [facility] AT [facility] Provider Note   CSN: [id number] Arrival date & time: 07/21/23  2253     History  Chief Complaint  Patient presents with   Abdominal Pain    Paula Cooley is a 33 y.o. female past medical history significant for bowel obstruction, bowel perforation, and bowel resection presents today for abdominal pain that began around 1900 today.  Patient also reports vomiting.  Patient states that her last bowel movement was approximately 2 days ago and did have bright red blood in it.  Patient denies currently passing flatus, fever, chills, chest pain, shortness of breath, dizziness, or urinary symptoms.   Abdominal Pain Associated symptoms: nausea and vomiting        Home Medications Prior to Admission medications   Medication Sig Start Date End Date Taking? Authorizing Provider  amphetamine -dextroamphetamine  (ADDERALL) 20 MG tablet Take 1 tablet (20 mg total) by mouth 2 (two) times daily. 04/16/20   Tysinger, Christiane Cowing, PA-C  buPROPion (WELLBUTRIN XL) 300 MG 24 hr tablet Take 300 mg by mouth every morning. 03/04/23   [provider]  Calcium Polycarbophil (FIBER-CAPS PO) Take 5 capsules by mouth 2 (two) times daily.    [provider]  cetirizine (ZYRTEC) 10 MG tablet Take 10 mg by mouth daily.    [provider]  citalopram (CELEXA) 20 MG tablet Take 20 mg by mouth daily. 03/09/23   [provider]  Continuous Glucose Sensor (DEXCOM G7 SENSOR) MISC Inject 1 Application into the skin as directed. Change sensor every 10 days as directed. 06/25/23   Wendel Hals, NP  ibuprofen  (ADVIL ) 800 MG tablet Take 1 tablet (800 mg total) by mouth every 8 (eight) hours as needed for mild pain or moderate pain. 05/10/21   Sakai, Isami, DO  Probiotic Product (PROBIOTIC PO) Take 1 capsule by mouth daily.    [provider]      Allergies    Patient has no known allergies.    Review of Systems   Review of  Systems  Gastrointestinal:  Positive for abdominal pain, blood in stool, nausea and vomiting.    Physical Exam Updated Vital Signs BP 121/70   Pulse 84   Temp 98.6 F (37 C)   Resp 16   Ht 5' 6 (1.676 m)   Wt 81.6 kg   LMP 07/08/2023 (Approximate)   SpO2 100%   BMI 29.05 kg/m  Physical Exam Vitals and nursing note reviewed. Exam conducted with a chaperone present.  Constitutional:      General: She is not in acute distress.    Appearance: She is well-developed. She is not toxic-appearing.  HENT:     Head: Normocephalic and atraumatic.     Mouth/Throat:     Mouth: Mucous membranes are moist.  Eyes:     Extraocular Movements: Extraocular movements intact.     Conjunctiva/sclera: Conjunctivae normal.  Cardiovascular:     Rate and Rhythm: Normal rate and regular rhythm.     Heart sounds: Normal heart sounds. No murmur heard. Pulmonary:     Effort: Pulmonary effort is normal. No respiratory distress.     Breath sounds: Normal breath sounds.  Abdominal:     General: There is no distension.     Palpations: Abdomen is soft.     Tenderness: There is abdominal tenderness in the periumbilical area. There is no guarding. Negative signs include Murphy's sign, Rovsing's sign and McBurney's sign.  Genitourinary:    Rectum: Normal.  Guaiac result negative.  Musculoskeletal:        General: No swelling.     Cervical back: Neck supple.  Skin:    General: Skin is warm and dry.     Capillary Refill: Capillary refill takes less than 2 seconds.  Neurological:     General: No focal deficit present.     Mental Status: She is alert and oriented to person, place, and time.  Psychiatric:        Mood and Affect: Mood normal.     ED Results / Procedures / Treatments   Labs (all labs ordered are listed, but only abnormal results are displayed) Labs Reviewed  URINALYSIS, ROUTINE W REFLEX MICROSCOPIC - Abnormal; Notable for the following components:      Result Value   APPearance HAZY  (*)    Ketones, ur 20 (*)    Protein, ur 30 (*)    Bacteria, UA RARE (*)    All other components within normal limits  PREGNANCY, URINE  OCCULT BLOOD X 1 CARD TO LAB, STOOL    EKG None  Radiology CT ABDOMEN PELVIS W CONTRAST Result Date: 07/22/2023 CLINICAL DATA:  Periumbilical pain, abdominal EXAM: CT ABDOMEN AND PELVIS WITH CONTRAST TECHNIQUE: Multidetector CT imaging of the abdomen and pelvis was performed using the standard protocol following bolus administration of intravenous contrast. RADIATION DOSE REDUCTION: This exam was performed according to the departmental dose-optimization program which includes automated exposure control, adjustment of the mA and/or kV according to patient size and/or use of iterative reconstruction technique. CONTRAST:  75mL OMNIPAQUE  IOHEXOL  350 MG/ML SOLN COMPARISON:  04/09/2021 FINDINGS: Lower chest: No acute abnormality Hepatobiliary: No focal hepatic abnormality. Gallbladder unremarkable. Pancreas: No focal abnormality or ductal dilatation. Spleen: No focal abnormality.  Normal size. Adrenals/Urinary Tract: Stable small cyst in the lower pole of the left kidney. No additional follow-up imaging recommended. No stones or hydronephrosis. Adrenal glands and urinary bladder unremarkable. Stomach/Bowel: Stomach is decompressed. Mildly dilated small bowel loops into the pelvis with scattered air-fluid levels. Distal small bowel is decompressed. Postoperative changes in the distal small bowel. Moderate stool burden throughout the colon. Vascular/Lymphatic: No evidence of aneurysm or adenopathy. Reproductive: Uterus and adnexa unremarkable. No mass. IUD in the uterus. Other: Small to moderate free fluid in the pelvis.  No free air. Musculoskeletal: No acute bony abnormality. IMPRESSION: Dilated small bowel loops into the pelvis with scattered air-fluid levels. Postoperative changes in the distal small bowel which is decompressed. Findings concerning for early partial small  bowel obstruction. Electronically Signed   By: Janeece Mechanic M.D.   On: 07/22/2023 01:31    Procedures Procedures    Medications Ordered in ED Medications  diatrizoate  meglumine -sodium (GASTROGRAFIN ) 66-10 % solution 90 mL (has no administration in time range)  LORazepam  (ATIVAN ) injection 2 mg (has no administration in time range)  ondansetron  (ZOFRAN ) injection 4 mg (4 mg Intravenous Given 07/21/23 2340)  morphine  (PF) 4 MG/ML injection 4 mg (4 mg Intravenous Given 07/21/23 2340)  morphine  (PF) 2 MG/ML injection 2 mg (2 mg Intravenous Given 07/22/23 0049)  iohexol  (OMNIPAQUE ) 350 MG/ML injection 75 mL (75 mLs Intravenous Contrast Given 07/22/23 0126)  morphine  (PF) 4 MG/ML injection 4 mg (4 mg Intravenous Given 07/22/23 0159)    ED Course/ Medical Decision Making/ A&P                                 Medical Decision Making Amount  and/or Complexity of Data Reviewed Labs: ordered. Radiology: ordered.  Risk Prescription drug management.   This patient presents to the ED for concern of abdominal pain with vomiting, this involves an extensive number of treatment options, and is a complaint that carries with it a high risk of complications and morbidity.  The differential diagnosis includes SBO, bowel perforation, viral gastroenteritis, diverticulitis, ulcerative colitis, appendicitis, choledocholithiasis, acute cholecystitis   Co morbidities / Chronic conditions that complicate the patient evaluation  bowel obstruction, bowel perforation, and bowel resection   Additional history obtained:  Additional history obtained from EMR External records from outside source obtained and reviewed including Care Everywhere   Lab Tests:  I Ordered, and personally interpreted labs.  The pertinent results include: CBC, CMP, lipase WNL, UA with with 20 ketones, 30 protein, and rare bacteria, negative pregnancy   Imaging Studies ordered:  I ordered imaging studies including CT abdomen pelvis  with contrast I independently visualized and interpreted imaging which showed findings concerning for early partial small bowel obstruction I agree with the radiologist interpretation    Problem List / ED Course / Critical interventions / Medication management  I ordered medication including morphine  and Zofran  Reevaluation of the patient after these medicines showed that the patient improvement of pain I have reviewed the patients home medicines and have made adjustments as needed   Consultations Obtained:  General surgery, Dr. Aniceto Barley is agreeable to see the patient while admitted Hospitalist, Dr. Sundil who is agreeable to admission  Test / Admission - Considered:  Admit for SBO         Final Clinical Impression(s) / ED Diagnoses Final diagnoses:  SBO (small bowel obstruction) [facility])    Rx / DC Orders ED Discharge Orders     None         Carie Charity, PA-C 07/22/23 1610    Onetha Bile, MD 07/22/23 [phone]

## 2023-07-21 NOTE — ED Triage Notes (Signed)
 Pt reports abd pain that began earlier today, pt reports some nausea and vomiting also. Pt states she has had multiple surgeries in past to remove parts of her intestine.

## 2023-07-22 ENCOUNTER — Inpatient Hospital Stay (HOSPITAL_COMMUNITY)

## 2023-07-22 ENCOUNTER — Emergency Department (HOSPITAL_COMMUNITY)

## 2023-07-22 ENCOUNTER — Encounter (HOSPITAL_COMMUNITY): Payer: Self-pay | Admitting: Internal Medicine

## 2023-07-22 DIAGNOSIS — Z9049 Acquired absence of other specified parts of digestive tract: Secondary | ICD-10-CM | POA: Diagnosis not present

## 2023-07-22 DIAGNOSIS — Z8249 Family history of ischemic heart disease and other diseases of the circulatory system: Secondary | ICD-10-CM | POA: Diagnosis not present

## 2023-07-22 DIAGNOSIS — Z6830 Body mass index (BMI) 30.0-30.9, adult: Secondary | ICD-10-CM | POA: Diagnosis not present

## 2023-07-22 DIAGNOSIS — Z79899 Other long term (current) drug therapy: Secondary | ICD-10-CM | POA: Diagnosis not present

## 2023-07-22 DIAGNOSIS — E66811 Obesity, class 1: Secondary | ICD-10-CM | POA: Diagnosis present

## 2023-07-22 DIAGNOSIS — K625 Hemorrhage of anus and rectum: Secondary | ICD-10-CM

## 2023-07-22 DIAGNOSIS — Z9889 Other specified postprocedural states: Secondary | ICD-10-CM | POA: Diagnosis not present

## 2023-07-22 DIAGNOSIS — Z833 Family history of diabetes mellitus: Secondary | ICD-10-CM | POA: Diagnosis not present

## 2023-07-22 DIAGNOSIS — Z87891 Personal history of nicotine dependence: Secondary | ICD-10-CM | POA: Diagnosis not present

## 2023-07-22 DIAGNOSIS — R0602 Shortness of breath: Secondary | ICD-10-CM | POA: Diagnosis not present

## 2023-07-22 DIAGNOSIS — Z8719 Personal history of other diseases of the digestive system: Secondary | ICD-10-CM | POA: Diagnosis not present

## 2023-07-22 DIAGNOSIS — K56600 Partial intestinal obstruction, unspecified as to cause: Secondary | ICD-10-CM | POA: Diagnosis present

## 2023-07-22 DIAGNOSIS — K56609 Unspecified intestinal obstruction, unspecified as to partial versus complete obstruction: Secondary | ICD-10-CM | POA: Diagnosis present

## 2023-07-22 DIAGNOSIS — F39 Unspecified mood [affective] disorder: Secondary | ICD-10-CM | POA: Diagnosis present

## 2023-07-22 DIAGNOSIS — F909 Attention-deficit hyperactivity disorder, unspecified type: Secondary | ICD-10-CM | POA: Diagnosis present

## 2023-07-22 DIAGNOSIS — K922 Gastrointestinal hemorrhage, unspecified: Secondary | ICD-10-CM

## 2023-07-22 DIAGNOSIS — F411 Generalized anxiety disorder: Secondary | ICD-10-CM | POA: Diagnosis present

## 2023-07-22 LAB — ABO/RH: ABO/RH(D): A POS

## 2023-07-22 LAB — CBC
HCT: 39.6 % (ref 36.0–46.0)
Hemoglobin: 13.5 g/dL (ref 12.0–15.0)
MCH: 30.3 pg (ref 26.0–34.0)
MCHC: 34.1 g/dL (ref 30.0–36.0)
MCV: 89 fL (ref 80.0–100.0)
Platelets: 246 10*3/uL (ref 150–400)
RBC: 4.45 MIL/uL (ref 3.87–5.11)
RDW: 12.4 % (ref 11.5–15.5)
WBC: 8.8 10*3/uL (ref 4.0–10.5)
nRBC: 0 % (ref 0.0–0.2)

## 2023-07-22 LAB — BASIC METABOLIC PANEL WITH GFR
Anion gap: 11 (ref 5–15)
BUN: 7 mg/dL (ref 6–20)
CO2: 25 mmol/L (ref 22–32)
Calcium: 9.3 mg/dL (ref 8.9–10.3)
Chloride: 102 mmol/L (ref 98–111)
Creatinine, Ser: 0.85 mg/dL (ref 0.44–1.00)
GFR, Estimated: >60 mL/min (ref >60)
Glucose, Bld: 78 mg/dL (ref 70–99)
Potassium: 3.6 mmol/L (ref 3.5–5.1)
Sodium: 138 mmol/L (ref 135–145)

## 2023-07-22 LAB — URINALYSIS, ROUTINE W REFLEX MICROSCOPIC
Bilirubin Urine: NEGATIVE
Glucose, UA: NEGATIVE mg/dL
Hgb urine dipstick: NEGATIVE
Ketones, ur: 20 mg/dL — AB
Leukocytes,Ua: NEGATIVE
Nitrite: NEGATIVE
Protein, ur: 30 mg/dL — AB
Specific Gravity, Urine: 1.026 (ref 1.005–1.030)
pH: 6 (ref 5.0–8.0)

## 2023-07-22 LAB — PREGNANCY, URINE: Preg Test, Ur: NEGATIVE

## 2023-07-22 LAB — HIV ANTIBODY (ROUTINE TESTING W REFLEX): HIV Screen 4th Generation wRfx: NONREACTIVE

## 2023-07-22 LAB — OCCULT BLOOD X 1 CARD TO LAB, STOOL: Fecal Occult Bld: NEGATIVE

## 2023-07-22 LAB — TYPE AND SCREEN
ABO/RH(D): A POS
Antibody Screen: NEGATIVE

## 2023-07-22 LAB — LACTIC ACID, PLASMA: Lactic Acid, Venous: 0.7 mmol/L (ref 0.5–1.9)

## 2023-07-22 LAB — GLUCOSE, CAPILLARY: Glucose-Capillary: 82 mg/dL (ref 70–99)

## 2023-07-22 MED ORDER — MIDAZOLAM HCL 2 MG/2ML IJ SOLN: 2.0000 mg | Freq: Once | INTRAMUSCULAR | Status: DC

## 2023-07-22 MED ORDER — HYDROMORPHONE HCL 1 MG/ML IJ SOLN
0.5000 mg | INTRAMUSCULAR | Status: DC | PRN
  Administered 2023-07-22: 0.5 mg via INTRAVENOUS
  Administered 2023-07-22: 1 mg via INTRAVENOUS
  Filled 2023-07-22 (×2): qty 1

## 2023-07-22 MED ORDER — SODIUM CHLORIDE 0.9% FLUSH
3.0000 mL | INTRAVENOUS | Status: DC | PRN
  Administered 2023-07-22: 3 mL via INTRAVENOUS

## 2023-07-22 MED ORDER — FENTANYL CITRATE PF 50 MCG/ML IJ SOSY: 12.5000 ug | PREFILLED_SYRINGE | INTRAMUSCULAR | Status: DC | PRN

## 2023-07-22 MED ORDER — SODIUM CHLORIDE 0.9% FLUSH
3.0000 mL | Freq: Two times a day (BID) | INTRAVENOUS | Status: DC
  Administered 2023-07-22 – 2023-07-23 (×3): 3 mL via INTRAVENOUS

## 2023-07-22 MED ORDER — HYDROMORPHONE HCL 1 MG/ML IJ SOLN
1.0000 mg | INTRAMUSCULAR | Status: DC | PRN
  Administered 2023-07-22 – 2023-07-27 (×22): 1 mg via INTRAVENOUS
  Filled 2023-07-22 (×22): qty 1

## 2023-07-22 MED ORDER — SODIUM CHLORIDE 0.9 % IV SOLN: 250.0000 mL | INTRAVENOUS | Status: DC | PRN

## 2023-07-22 MED ORDER — LIDOCAINE VISCOUS HCL 2 % MT SOLN
3.0000 mL | Freq: Once | OROMUCOSAL | Status: DC
Start: 2023-07-22 — End: 2023-07-22

## 2023-07-22 MED ORDER — ACETAMINOPHEN 650 MG RE SUPP: 650.0000 mg | Freq: Four times a day (QID) | RECTAL | Status: DC | PRN

## 2023-07-22 MED ORDER — SODIUM CHLORIDE 0.9% FLUSH
3.0000 mL | Freq: Two times a day (BID) | INTRAVENOUS | Status: DC
  Administered 2023-07-22 – 2023-07-26 (×9): 3 mL via INTRAVENOUS

## 2023-07-22 MED ORDER — MORPHINE SULFATE (PF) 4 MG/ML IV SOLN
4.0000 mg | Freq: Once | INTRAVENOUS | Status: AC
  Administered 2023-07-22: 4 mg via INTRAVENOUS
  Filled 2023-07-22: qty 1

## 2023-07-22 MED ORDER — MORPHINE SULFATE (PF) 2 MG/ML IV SOLN
2.0000 mg | INTRAVENOUS | Status: DC | PRN
  Administered 2023-07-22 – 2023-07-23 (×5): 2 mg via INTRAVENOUS
  Filled 2023-07-22 (×5): qty 1

## 2023-07-22 MED ORDER — ONDANSETRON HCL 4 MG PO TABS
4.0000 mg | ORAL_TABLET | Freq: Four times a day (QID) | ORAL | Status: AC | PRN
Start: 2023-07-22 — End: ?

## 2023-07-22 MED ORDER — LORAZEPAM 2 MG/ML IJ SOLN: 2.0000 mg | Freq: Once | INTRAMUSCULAR | Status: DC

## 2023-07-22 MED ORDER — LORAZEPAM 2 MG/ML IJ SOLN
0.5000 mg | Freq: Four times a day (QID) | INTRAMUSCULAR | Status: DC | PRN
  Administered 2023-07-22 – 2023-07-24 (×2): 0.5 mg via INTRAVENOUS
  Filled 2023-07-22 (×3): qty 1

## 2023-07-22 MED ORDER — DIATRIZOATE MEGLUMINE & SODIUM 66-10 % PO SOLN
90.0000 mL | Freq: Once | ORAL | Status: AC
  Administered 2023-07-22: 90 mL via NASOGASTRIC
  Filled 2023-07-22: qty 90

## 2023-07-22 MED ORDER — LACTATED RINGERS IV SOLN: INTRAVENOUS | Status: AC

## 2023-07-22 MED ORDER — LIDOCAINE VISCOUS HCL 2 % MT SOLN
3.0000 mL | Freq: Once | OROMUCOSAL | Status: AC
  Administered 2023-07-22: 3 mL via OROMUCOSAL

## 2023-07-22 MED ORDER — MORPHINE SULFATE (PF) 2 MG/ML IV SOLN
2.0000 mg | Freq: Once | INTRAVENOUS | Status: AC
  Administered 2023-07-22: 2 mg via INTRAVENOUS
  Filled 2023-07-22: qty 1

## 2023-07-22 MED ORDER — LIDOCAINE HCL URETHRAL/MUCOSAL 2 % EX GEL
1.0000 | Freq: Once | CUTANEOUS | Status: AC
  Administered 2023-07-22: 1
  Filled 2023-07-22: qty 6

## 2023-07-22 MED ORDER — ONDANSETRON HCL 4 MG/2ML IJ SOLN
4.0000 mg | Freq: Four times a day (QID) | INTRAMUSCULAR | Status: DC | PRN
  Administered 2023-07-22 – 2023-07-25 (×9): 4 mg via INTRAVENOUS
  Filled 2023-07-22 (×9): qty 2

## 2023-07-22 MED ORDER — DEXTROSE 50 % IV SOLN: 1.0000 | INTRAVENOUS | Status: DC | PRN

## 2023-07-22 MED ORDER — IOHEXOL 350 MG/ML SOLN
75.0000 mL | Freq: Once | INTRAVENOUS | Status: AC | PRN
  Administered 2023-07-22: 75 mL via INTRAVENOUS

## 2023-07-22 MED ORDER — PHENOL 1.4 % MT LIQD
1.0000 | OROMUCOSAL | Status: DC | PRN
  Administered 2023-07-22: 1 via OROMUCOSAL
  Filled 2023-07-22: qty 177

## 2023-07-22 MED ORDER — HYDROMORPHONE HCL 1 MG/ML IJ SOLN
1.5000 mg | Freq: Once | INTRAMUSCULAR | Status: AC
  Administered 2023-07-22: 1.5 mg via INTRAVENOUS
  Filled 2023-07-22: qty 1.5

## 2023-07-22 MED ORDER — PANTOPRAZOLE SODIUM 40 MG IV SOLR
40.0000 mg | Freq: Two times a day (BID) | INTRAVENOUS | Status: DC
  Administered 2023-07-22 – 2023-07-25 (×8): 40 mg via INTRAVENOUS
  Filled 2023-07-22 (×8): qty 10

## 2023-07-22 NOTE — H&P (Addendum)
 History and Physical    Paula Cooley RUE:[id number] DOB: Dec 13, 1990 DOA: 07/21/2023  PCP: Pcp, No   Patient coming from: Home   Chief Complaint:  Chief Complaint  Patient presents with   Abdominal Pain   ED TRIAGE note:    Pt reports lower abdominal pain lower, behind my belly button, onset today around 1900, associated with vomiting. Hx of multiple abdomen surgeries.    Pt just left [facility] ED tonight due to long lobby wait times. She had blood work done there and has resulted in epic.      HPI:  Paula Cooley is a 33 y.o. female with medical history significant of SBO status post ex lap small bowel resection and anastomosis in 04/2021,  history of perforated Meckel's diverticulum s/p resection anastomosis 11/20220, generalized anxiety disorder, and ADHD presented emergency department complaining of abdominal pain with associated vomiting.  Patient initially went to [facility] however due to prolonged waiting time she came to the [facility] for further evaluation.  During my patient bedside patient reported that she has been started noticing abdominal pain behind her umbilicus that started around 7 PM with associated nausea and vomiting.  However patient reported the vomiting has been resolved but continues to have persistent abdominal pain which is cramping in quality and 10 out of 10 in severity.  Last bowel movement 1 day ago and unable to pass flatus.  Patient also reported since last surgery 2023 she has intermittent bright red blood per rectum and blood clot.  Patient reported last 2 days she has noticed blood clot per rectum however since in the ED there is no blood clot anymore.  Denies history of EGD and colonoscopy.  Denies history of hemorrhoids.   ED Course:  At presentation to ED patient is hemodynamically stable. CBC and CMP unremarkable. Pregnancy test negative.  UA unremarkable. FOBT negative.  CT abdomen pelvis showing dilated small bowel loop in the pelvis with  scattered air-fluid level.  Postoperative changes in the distal small bowel which is decompressed. Findings concerning for early partial small bowel obstruction.  In the ED General Surgery Dr. Aniceto Barley has been consulted.  General surgery placed order for NG tube and SBO protocol.  Hospitalist has been consulted for further management of partial SBO.   Significant labs in the ED: Lab Orders         Urinalysis, Routine w reflex microscopic -Urine, Clean Catch         Pregnancy, urine         Occult blood card to lab, stool Provider will collect         HIV Antibody (routine testing w rflx)         CBC         Basic metabolic panel         Lactic acid, plasma       Review of Systems:  Review of Systems  Constitutional:  Negative for chills and fever.  Cardiovascular:  Negative for chest pain.  Gastrointestinal:  Positive for abdominal pain and vomiting. Negative for blood in stool, constipation, diarrhea, heartburn, melena and nausea.  Neurological:  Negative for dizziness and headaches.  Psychiatric/Behavioral:  The patient is not nervous/anxious.     Past Medical History:  Diagnosis Date   Acetabular labrum tear    R shoulder    Acne    Asthma    exercise induced, no problems in quite a while, last use of inhaler- >one year   Hypoglycemia  uses glucose tablets PRN   Perforated bowel (HCC) 2020   Seasonal allergies     Past Surgical History:  Procedure Laterality Date   LAPAROSCOPY N/A 12/31/2018   Procedure: LAPAROSCOPY DIAGNOSTIC;  Surgeon: Conrado Delay, DO;  Location: [facility] ORS;  Service: General;  Laterality: N/A;   LAPAROTOMY N/A 12/31/2018   Procedure: EXPLORATORY LAPAROTOMY WITH REDUCTION OF HERNIA;  Surgeon: Conrado Delay, DO;  Location: [facility] ORS;  Service: General;  Laterality: N/A;   ROTATOR CUFF REPAIR Right 07/2011   as also bicep tendon surgery   SHOULDER ARTHROSCOPY WITH LABRAL REPAIR Right 01/01/2016   Procedure: SHOULDER ARTHROSCOPY WITH LABRAL (SLAP)  REPAIR;  Surgeon: Jasmine Mesi, MD;  Location: [facility] OR;  Service: Orthopedics;  Laterality: Right;   WISDOM TOOTH EXTRACTION     XI ROBOT ASSISTED DIAGNOSTIC LAPAROSCOPY N/A 12/28/2018   Procedure: XI ROBOT ASSISTED DIAGNOSTIC LAPAROSCOPY;  Surgeon: Conrado Delay, DO;  Location: [facility] ORS;  Service: General;  Laterality: N/A;   XI ROBOTIC ASSISTED SMALL BOWEL RESECTION N/A 05/07/2021   Procedure: OPEN SMALL BOWEL RESECTION;  Surgeon: Conrado Delay, DO;  Location: [facility] ORS;  Service: General;  Laterality: N/A;     reports that she quit smoking about 8 years ago. Her smoking use included cigarettes. She has never used smokeless tobacco. She reports current alcohol use. She reports that she does not use drugs.  No Known Allergies  Family History  Problem Relation Age of Onset   Hypertension Mother    Heart disease Father 39       MI; s/p CABG   Diabetes Father        resolved after weight loss   Cancer Paternal Grandmother        lung    Prior to Admission medications   Medication Sig Start Date End Date Taking? Authorizing Provider  amphetamine -dextroamphetamine  (ADDERALL) 20 MG tablet Take 1 tablet (20 mg total) by mouth 2 (two) times daily. 04/16/20   Tysinger, Christiane Cowing, PA-C  buPROPion (WELLBUTRIN XL) 300 MG 24 hr tablet Take 300 mg by mouth every morning. 03/04/23   [provider]  Calcium Polycarbophil (FIBER-CAPS PO) Take 5 capsules by mouth 2 (two) times daily.    [provider]  cetirizine (ZYRTEC) 10 MG tablet Take 10 mg by mouth daily.    [provider]  citalopram (CELEXA) 20 MG tablet Take 20 mg by mouth daily. 03/09/23   [provider]  Continuous Glucose Sensor (DEXCOM G7 SENSOR) MISC Inject 1 Application into the skin as directed. Change sensor every 10 days as directed. 06/25/23   Wendel Hals, NP  ibuprofen  (ADVIL ) 800 MG tablet Take 1 tablet (800 mg total) by mouth every 8 (eight) hours as needed for mild pain or moderate pain.  05/10/21   Sakai, Isami, DO  Probiotic Product (PROBIOTIC PO) Take 1 capsule by mouth daily.    [provider]     Physical Exam: Vitals:   07/21/23 2258 07/21/23 2259 07/21/23 2345 07/22/23 0202  BP:  119/79 118/78 121/70  Pulse:  78 63 84  Resp:  15 18 16   Temp:  98.6 F (37 C)    SpO2:  95% 99% 100%  Weight: 81.6 kg     Height: 5' 6 (1.676 m)       Physical Exam Vitals and nursing note reviewed.  Constitutional:      General: She is not in acute distress.    Appearance: She is not ill-appearing.  Cardiovascular:  Rate and Rhythm: Normal rate.  Pulmonary:     Effort: Pulmonary effort is normal.  Abdominal:     General: Abdomen is flat. Bowel sounds are normal.     Palpations: Abdomen is soft.     Tenderness: There is abdominal tenderness in the periumbilical area. There is no guarding or rebound.     Hernia: There is no hernia in the umbilical area or ventral area.  Skin:    Capillary Refill: Capillary refill takes less than 2 seconds.  Neurological:     Mental Status: She is alert.  Psychiatric:        Mood and Affect: Mood normal.      Labs on Admission: I have personally reviewed following labs and imaging studies  CBC: Recent Labs  Lab 07/21/23 2150  WBC 5.6  HGB 14.2  HCT 41.4  MCV 90.4  PLT 227   Basic Metabolic Panel: Recent Labs  Lab 07/21/23 2150  NA 139  K 3.7  CL 103  CO2 26  GLUCOSE 102*  BUN 12  CREATININE 0.76  CALCIUM 9.5   GFR: Estimated Creatinine Clearance: 108.7 mL/min (by C-G formula based on SCr of 0.76 mg/dL). Liver Function Tests: Recent Labs  Lab 07/21/23 2150  AST 19  ALT 19  ALKPHOS 61  BILITOT 0.7  PROT 7.7  ALBUMIN 4.5   Recent Labs  Lab 07/21/23 2150  LIPASE 33   No results for input(s): AMMONIA in the last 168 hours. Coagulation Profile: No results for input(s): INR, PROTIME in the last 168 hours. Cardiac Enzymes: No results for input(s): CKTOTAL, CKMB, CKMBINDEX,  TROPONINI, TROPONINIHS in the last 168 hours. BNP (last 3 results) No results for input(s): BNP in the last 8760 hours. HbA1C: No results for input(s): HGBA1C in the last 72 hours. CBG: No results for input(s): GLUCAP in the last 168 hours. Lipid Profile: No results for input(s): CHOL, HDL, LDLCALC, TRIG, CHOLHDL, LDLDIRECT in the last 72 hours. Thyroid Function Tests: No results for input(s): TSH, T4TOTAL, FREET4, T3FREE, THYROIDAB in the last 72 hours. Anemia Panel: No results for input(s): VITAMINB12, FOLATE, FERRITIN, TIBC, IRON, RETICCTPCT in the last 72 hours. Urine analysis:    Component Value Date/Time   COLORURINE YELLOW 07/21/2023 0052   APPEARANCEUR HAZY (A) 07/21/2023 0052   LABSPEC 1.026 07/21/2023 0052   PHURINE 6.0 07/21/2023 0052   GLUCOSEU NEGATIVE 07/21/2023 0052   HGBUR NEGATIVE 07/21/2023 0052   BILIRUBINUR NEGATIVE 07/21/2023 0052   BILIRUBINUR neg 09/06/2012 1355   KETONESUR 20 (A) 07/21/2023 0052   PROTEINUR 30 (A) 07/21/2023 0052   UROBILINOGEN negative 09/06/2012 1355   NITRITE NEGATIVE 07/21/2023 0052   LEUKOCYTESUR NEGATIVE 07/21/2023 0052    Radiological Exams on Admission: I have personally reviewed images CT ABDOMEN PELVIS W CONTRAST Result Date: 07/22/2023 CLINICAL DATA:  Periumbilical pain, abdominal EXAM: CT ABDOMEN AND PELVIS WITH CONTRAST TECHNIQUE: Multidetector CT imaging of the abdomen and pelvis was performed using the standard protocol following bolus administration of intravenous contrast. RADIATION DOSE REDUCTION: This exam was performed according to the departmental dose-optimization program which includes automated exposure control, adjustment of the mA and/or kV according to patient size and/or use of iterative reconstruction technique. CONTRAST:  75mL OMNIPAQUE  IOHEXOL  350 MG/ML SOLN COMPARISON:  04/09/2021 FINDINGS: Lower chest: No acute abnormality Hepatobiliary: No focal hepatic abnormality.  Gallbladder unremarkable. Pancreas: No focal abnormality or ductal dilatation. Spleen: No focal abnormality.  Normal size. Adrenals/Urinary Tract: Stable small cyst in the lower pole of the left kidney.  No additional follow-up imaging recommended. No stones or hydronephrosis. Adrenal glands and urinary bladder unremarkable. Stomach/Bowel: Stomach is decompressed. Mildly dilated small bowel loops into the pelvis with scattered air-fluid levels. Distal small bowel is decompressed. Postoperative changes in the distal small bowel. Moderate stool burden throughout the colon. Vascular/Lymphatic: No evidence of aneurysm or adenopathy. Reproductive: Uterus and adnexa unremarkable. No mass. IUD in the uterus. Other: Small to moderate free fluid in the pelvis.  No free air. Musculoskeletal: No acute bony abnormality. IMPRESSION: Dilated small bowel loops into the pelvis with scattered air-fluid levels. Postoperative changes in the distal small bowel which is decompressed. Findings concerning for early partial small bowel obstruction. Electronically Signed   By: Janeece Mechanic M.D.   On: 07/22/2023 01:31     EKG: Pending EKG    Assessment/Plan: Principal Problem:   SOB (shortness of breath) Active Problems:   Small bowel obstruction (HCC)   GI bleed   SBO (small bowel obstruction) (HCC)   History of SBO s/p small bowel resection anastomosis in 2023    Assessment and Plan: Partial small bowel obstruction History of SBO s/p bowel resection and anastomosis in March 2023 History of perforated Meckel's diverticulum s/p resection anastomosis in November 2020 -Patient present emergency department with complaining of periumbilical and postumbilical abdominal pain with associated nausea which has been ongoing for last 24 hours. - Hemodynamically stable.  CBC and CMP unremarkable.  FOBT negative. - CT abdomen showed early partial small bowel obstruction. -History of previous abdominal surgery x 2. - With the  development of SBO General Surgery Dr. Aniceto Barley has been consulted.  Given patient does not have any nausea and vomiting recommended to hold off to place the NG tube and continue SBO protocol with Gastrografin .  -Continue monitor electrolytes.  Keep patient NPO. - Continue maintenance fluid LR 125 cc/h -Continue pain control.  History of intermittent blood clot per rectum  - Patient reported intermittent blood clot per rectum since abdominal surgery in 2023.   -Reported 2 episodes of blood clot in last 2 days. -FOBT negative.  -Normal hemoglobin 12.2 and hematocrit 41.  RBC count with normal range.  Normal platelet and WBC count. -Starting IV Protonix  40 mg twice daily. -CT abdomen pelvis with contrast no evidence of acute diverticulitis and active bleeding. -Given patient is hemodynamically stable and FOBT negative there is a concern for active GI bleed at this time. -Checking lactic acid and obtaining type and screen type and screen. -Discussed with patient in case hemoglobin drops or patient develops active GI bleed and becomes hemodynamically stable agrees with blood transfusion. -Consulted [facility] Dr. Yvone Herd for evaluation.   History of ADHD Generalized anxiety disorder - Pending pharmacy medication reconciliation  DVT prophylaxis:  SCDs and SCD.   deferring pharmacological DVT prophylaxis in case patient will undergo surgical intervention and high risk of bleeding.  ACD. Code Status:  Full Code Diet: N.p.o. Family Communication:  Family was present at bedside, at the time of interview. Opportunity was given to ask question and all questions were answered satisfactorily.  Disposition Plan:Continue monitor improvement of SBO Consults: General surgery Admission status:   Inpatient, Telemetry bed  Severity of Illness: The appropriate patient status for this patient is INPATIENT. Inpatient status is judged to be reasonable and necessary in order to provide the required intensity of  service to ensure the patient's safety. The patient's presenting symptoms, physical exam findings, and initial radiographic and laboratory data in the context of their chronic comorbidities is felt to  place them at high risk for further clinical deterioration. Furthermore, it is not anticipated that the patient will be medically stable for discharge from the hospital within 2 midnights of admission.   * I certify that at the point of admission it is my clinical judgment that the patient will require inpatient hospital care spanning beyond 2 midnights from the point of admission due to high intensity of service, high risk for further deterioration and high frequency of surveillance required.Aaron Aas    Zienna Ahlin, MD [facility]  How to contact the [facility] Attending or Consulting provider 7A - 7P or covering provider during after hours 7P -7A, for this patient.  Check the care team in [facility] and look for a) attending/consulting [facility] provider listed and b) the [facility] team listed Log into [email or web address] and use [place]'s universal password to access. If you do not have the password, please contact the hospital operator. Locate the [facility] provider you are looking for under [facility] and page to a number that you can be directly reached. If you still have difficulty reaching the provider, please page the [facility] (Director on Call) for the Hospitalists listed on amion for assistance.  07/22/2023, 2:52 AM

## 2023-07-22 NOTE — Progress Notes (Signed)
 Medicated for pain

## 2023-07-22 NOTE — Progress Notes (Addendum)
 Patient writhing all over the bed.  Yelling and screaming.  Complaining that pain medication was not working.  MD notified

## 2023-07-22 NOTE — Progress Notes (Signed)
 Orders received to place NGT via IR

## 2023-07-22 NOTE — Consult Note (Signed)
 Reason for Consult/Chief Complaint: SBO Consultant: Sylvester Evert, [place]  Paula Cooley is an 33 y.o. female.   HPI: 25F who presents with central abdominal pain near my belly button that began 6/10 at 1900. She reports associated n/v around the time of onset of the abdominal pain, but no further n/v. SHe describes the abdominal pain as constant, with intermittent waves of increased pain. She reports last passage of flatus and BM 6/10 AM. She reports presenting to [facility] and leaving due to a lengthy wait. She has had two prior abdominal surgeries, most recently in 2023 for SBO. She reports that the SBO in 2023 was the most recent SBO occurrence. She reports BRBPR which she states has been intermittent since her surgery in 2023 and blood in stool in her most recent BM. She denies h/o hemorrhoids, prior EGD/colonoscopy.   Past Medical History:  Diagnosis Date   Acetabular labrum tear    R shoulder    Acne    Asthma    exercise induced, no problems in quite a while, last use of inhaler- >one year   Hypoglycemia    uses glucose tablets PRN   Perforated bowel (HCC) 2020   Seasonal allergies     Past Surgical History:  Procedure Laterality Date   LAPAROSCOPY N/A 12/31/2018   Procedure: LAPAROSCOPY DIAGNOSTIC;  Surgeon: Conrado Delay, DO;  Location: [facility] ORS;  Service: General;  Laterality: N/A;   LAPAROTOMY N/A 12/31/2018   Procedure: EXPLORATORY LAPAROTOMY WITH REDUCTION OF HERNIA;  Surgeon: Conrado Delay, DO;  Location: [facility] ORS;  Service: General;  Laterality: N/A;   ROTATOR CUFF REPAIR Right 07/2011   as also bicep tendon surgery   SHOULDER ARTHROSCOPY WITH LABRAL REPAIR Right 01/01/2016   Procedure: SHOULDER ARTHROSCOPY WITH LABRAL (SLAP) REPAIR;  Surgeon: Jasmine Mesi, MD;  Location: [facility] OR;  Service: Orthopedics;  Laterality: Right;   WISDOM TOOTH EXTRACTION     XI ROBOT ASSISTED DIAGNOSTIC LAPAROSCOPY N/A 12/28/2018   Procedure: XI ROBOT ASSISTED DIAGNOSTIC LAPAROSCOPY;  Surgeon:  Conrado Delay, DO;  Location: [facility] ORS;  Service: General;  Laterality: N/A;   XI ROBOTIC ASSISTED SMALL BOWEL RESECTION N/A 05/07/2021   Procedure: OPEN SMALL BOWEL RESECTION;  Surgeon: Conrado Delay, DO;  Location: [facility] ORS;  Service: General;  Laterality: N/A;    Family History  Problem Relation Age of Onset   Hypertension Mother    Heart disease Father 8       MI; s/p CABG   Diabetes Father        resolved after weight loss   Cancer Paternal Grandmother        lung    Social History:  reports that she quit smoking about 8 years ago. Her smoking use included cigarettes. She has never used smokeless tobacco. She reports current alcohol use. She reports that she does not use drugs.  Allergies: No Known Allergies  Medications: I have reviewed the patient's current medications.  Results for orders placed or performed during the hospital encounter of 07/21/23 (from the past 48 hours)  Urinalysis, Routine w reflex microscopic -Urine, Clean Catch     Status: Abnormal   Collection Time: 07/21/23 12:52 AM  Result Value Ref Range   Color, Urine YELLOW YELLOW   APPearance HAZY (A) CLEAR   Specific Gravity, Urine 1.026 1.005 - 1.030   pH 6.0 5.0 - 8.0   Glucose, UA NEGATIVE NEGATIVE mg/dL   Hgb urine dipstick NEGATIVE NEGATIVE   Bilirubin Urine NEGATIVE NEGATIVE  Ketones, ur 20 (A) NEGATIVE mg/dL   Protein, ur 30 (A) NEGATIVE mg/dL   Nitrite NEGATIVE NEGATIVE   Leukocytes,Ua NEGATIVE NEGATIVE   RBC / HPF 0-5 0 - 5 RBC/hpf   WBC, UA 0-5 0 - 5 WBC/hpf   Bacteria, UA RARE (A) NONE SEEN   Squamous Epithelial / HPF 6-10 0 - 5 /HPF   Mucus PRESENT     Comment: Performed at [facility], [address] [place], [place] [address]  Pregnancy, urine     Status: None   Collection Time: 07/21/23 12:52 AM  Result Value Ref Range   Preg Test, Ur NEGATIVE NEGATIVE    Comment:        THE SENSITIVITY OF THIS METHODOLOGY IS >25 mIU/mL. Performed at [facility], [address] [place], [place] [address]   Occult blood card to lab, stool Provider will collect     Status: None   Collection Time: 07/22/23 12:27 AM  Result Value Ref Range   Fecal Occult Bld NEGATIVE NEGATIVE    Comment: Performed at [facility], [address] [place], [place] [address]    CT ABDOMEN PELVIS W CONTRAST Result Date: 07/22/2023 CLINICAL DATA:  Periumbilical pain, abdominal EXAM: CT ABDOMEN AND PELVIS WITH CONTRAST TECHNIQUE: Multidetector CT imaging of the abdomen and pelvis was performed using the standard protocol following bolus administration of intravenous contrast. RADIATION DOSE REDUCTION: This exam was performed according to the departmental dose-optimization program which includes automated exposure control, adjustment of the mA and/or kV according to patient size and/or use of iterative reconstruction technique. CONTRAST:  75mL OMNIPAQUE  IOHEXOL  350 MG/ML SOLN COMPARISON:  04/09/2021 FINDINGS: Lower chest: No acute abnormality Hepatobiliary: No focal hepatic abnormality. Gallbladder unremarkable. Pancreas: No focal abnormality or ductal dilatation. Spleen: No focal abnormality.  Normal size. Adrenals/Urinary Tract: Stable small cyst in the lower pole of the left kidney. No additional follow-up imaging recommended. No stones or hydronephrosis. Adrenal glands and urinary bladder unremarkable. Stomach/Bowel: Stomach is decompressed. Mildly dilated small bowel loops into the pelvis with scattered air-fluid levels. Distal small bowel is decompressed. Postoperative changes in the distal small bowel. Moderate stool burden throughout the colon. Vascular/Lymphatic: No evidence of aneurysm or adenopathy. Reproductive: Uterus and adnexa unremarkable. No mass. IUD in the uterus. Other: Small to moderate free fluid in the pelvis.  No free air. Musculoskeletal: No acute bony abnormality. IMPRESSION: Dilated small bowel loops into the pelvis with scattered air-fluid levels. Postoperative changes in  the distal small bowel which is decompressed. Findings concerning for early partial small bowel obstruction. Electronically Signed   By: Janeece Mechanic M.D.   On: 07/22/2023 01:31    ROS 10 point review of systems is negative except as listed above in HPI.   Physical Exam Blood pressure 121/70, pulse 84, temperature 98.6 F (37 C), resp. rate 16, height 5' 6 (1.676 m), weight 81.6 kg, last menstrual period 07/08/2023, SpO2 100%. Constitutional: well-developed, well-nourished HEENT: pupils equal, round, reactive to light, 2mm b/l, moist conjunctiva, external inspection of ears and nose normal, hearing intact Oropharynx: normal oropharyngeal mucosa, normal dentition Neck: no thyromegaly, trachea midline, no midline cervical tenderness to palpation Chest: breath sounds equal bilaterally, normal respiratory effort, no midline or lateral chest wall tenderness to palpation/deformity Abdomen: soft, NT, well healed lower midline surgical scar, no bruising, no hepatosplenomegaly Rectal: deferred Skin: warm, dry, no rashes Psych: normal memory, normal mood/affect     Assessment/Plan: pSBO - recommend SBO protocol. Given no  active n/v, okay to not have NGT placed and drink contrast orally. Plan AXR 8h after contrast admin. Discussed expected findings and the possibility of needing additional time to the 24h mark for contrast passage. Discussed the potential need for surgery based on SBOP. All questions answered.  BRBPR - unable to perform rectal exam due to patient being in the hallway at the time of my exam. Hemoglobin normal. Stool guaiac ordered. Recommend EGD+colonoscopy as outpatient if this is negative.  FEN - strict NPO, if n/v, recommend NGT placement and discussed this with the patient. She reports requiring anxiolytic administration for NGT placement in the past.  Dispo - per primary    Anda Bamberg, MD General and Trauma Surgery [facility]

## 2023-07-22 NOTE — Progress Notes (Signed)
 Subjective: CC: Patient with worsening umbilical abdominal pain since presentation. Has already drank gastrografin  (0257). Some nausea this am. No vomiting. No flatus or bm in the last 2 days.   Afebrile. No tachycardia or systolic hypotension. WBC 8.8. Lactic wnl. K 3.6.   Objective: Vital signs in last 24 hours: Temp:  [98.1 F (36.7 C)-98.8 F (37.1 C)] 98.5 F (36.9 C) (06/11 0338) Pulse Rate:  [63-84] 81 (06/11 0338) Resp:  [15-18] 16 (06/11 0317) BP: (116-133)/(70-97) 132/72 (06/11 0338) SpO2:  [93 %-100 %] 93 % (06/11 0338) Weight:  [81.6 kg-84.7 kg] 84.7 kg (06/11 0351) Last BM Date : 07/19/23  Intake/Output from previous day: 06/10 0701 - 06/11 0700 In: 112.1 [I.V.:112.1] Out: -  Intake/Output this shift: No intake/output data recorded.  PE: Gen:  Alert, NAD, pleasant Abd: Soft, at least mild to moderate distension with generalized ttp worst in the periumbilical abdomen without rigidity or guarding.   Lab Results:  Recent Labs    07/21/23 2150 07/22/23 0612  WBC 5.6 8.8  HGB 14.2 13.5  HCT 41.4 39.6  PLT 227 246   BMET Recent Labs    07/21/23 2150 07/22/23 0612  NA 139 138  K 3.7 3.6  CL 103 102  CO2 26 25  GLUCOSE 102* 78  BUN 12 7  CREATININE 0.76 0.85  CALCIUM 9.5 9.3   PT/INR No results for input(s): LABPROT, INR in the last 72 hours. CMP     Component Value Date/Time   NA 138 07/22/2023 0612   NA 143 04/09/2023 0834   K 3.6 07/22/2023 0612   CL 102 07/22/2023 0612   CO2 25 07/22/2023 0612   GLUCOSE 78 07/22/2023 0612   BUN 7 07/22/2023 0612   BUN 12 04/09/2023 0834   CREATININE 0.85 07/22/2023 0612   CALCIUM 9.3 07/22/2023 0612   PROT 7.7 07/21/2023 2150   PROT 6.6 04/09/2023 0834   ALBUMIN 4.5 07/21/2023 2150   ALBUMIN 4.5 04/09/2023 0834   AST 19 07/21/2023 2150   ALT 19 07/21/2023 2150   ALKPHOS 61 07/21/2023 2150   BILITOT 0.7 07/21/2023 2150   BILITOT <0.2 04/09/2023 0834   GFRNONAA >60 07/22/2023 0612    GFRAA >60 01/04/2019 0526   Lipase     Component Value Date/Time   LIPASE 33 07/21/2023 2150    Studies/Results: CT ABDOMEN PELVIS W CONTRAST Result Date: 07/22/2023 CLINICAL DATA:  Periumbilical pain, abdominal EXAM: CT ABDOMEN AND PELVIS WITH CONTRAST TECHNIQUE: Multidetector CT imaging of the abdomen and pelvis was performed using the standard protocol following bolus administration of intravenous contrast. RADIATION DOSE REDUCTION: This exam was performed according to the departmental dose-optimization program which includes automated exposure control, adjustment of the mA and/or kV according to patient size and/or use of iterative reconstruction technique. CONTRAST:  75mL OMNIPAQUE  IOHEXOL  350 MG/ML SOLN COMPARISON:  04/09/2021 FINDINGS: Lower chest: No acute abnormality Hepatobiliary: No focal hepatic abnormality. Gallbladder unremarkable. Pancreas: No focal abnormality or ductal dilatation. Spleen: No focal abnormality.  Normal size. Adrenals/Urinary Tract: Stable small cyst in the lower pole of the left kidney. No additional follow-up imaging recommended. No stones or hydronephrosis. Adrenal glands and urinary bladder unremarkable. Stomach/Bowel: Stomach is decompressed. Mildly dilated small bowel loops into the pelvis with scattered air-fluid levels. Distal small bowel is decompressed. Postoperative changes in the distal small bowel. Moderate stool burden throughout the colon. Vascular/Lymphatic: No evidence of aneurysm or adenopathy. Reproductive: Uterus and adnexa unremarkable. No mass. IUD in the  uterus. Other: Small to moderate free fluid in the pelvis.  No free air. Musculoskeletal: No acute bony abnormality. IMPRESSION: Dilated small bowel loops into the pelvis with scattered air-fluid levels. Postoperative changes in the distal small bowel which is decompressed. Findings concerning for early partial small bowel obstruction. Electronically Signed   By: Janeece Mechanic M.D.   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 POD 3 was taken back for open LOA, reduction of internal hernia (2020). Underwent ex lap, LOA, SBR for SBO in 2023 - HDS without fever, tachycardia or systolic hypotension. No peritonitis on exam. WBC 8.8. LA wnl. No current indication for emergency surgery - Currently doing SBO protocol orally. Drank contrast around 0257 this am. With worsening pain, nausea and distension on exam today will get xray now to see if worsening distension of stomach and small bowel. Discussed recommendation for NGT if worsening distension (she is agreeable to NGT if needed) - Keep K >=4, Phos >= 3, Mg >= 2 and mobilize for bowel function. If has NGT, okay for NGT to be clamped for mobilization.  - Hopefully patient will improve with conservative management. If patient fails to improve with conservative management, they may require exploratory surgery during admission - We will follow with you.  FEN - NPO, IVF per TRH VTE - SCDs, okay for chem ppx from a general surgery standpoint ID - None  BRBPR - Agree with initial consult note. Stool guaiac neg. HDS. Hgb stable. Recommend EGD+colonoscopy as outpatient   I reviewed nursing notes, hospitalist notes, last 24 h vitals and pain scores, last 48 h intake and output, last 24 h labs and trends, and last 24 h imaging results.   LOS: 0 days    Delton Filbert, [facility] 07/22/2023, 7:44 AM Please see Amion for pager number during day hours 7:00am-4:30pm

## 2023-07-22 NOTE — Plan of Care (Signed)
   Problem: Clinical Measurements: Goal: Diagnostic test results will improve Outcome: Progressing

## 2023-07-22 NOTE — Progress Notes (Signed)
 Patient yelling and screaming in pain medicated

## 2023-07-22 NOTE — Progress Notes (Addendum)
 Pt repeatedly asking for ice chips and water. Educated pt on importance of maintaining strict NPO status per order with her current diagnosis. Pt verbalizes understanding but continues to ask for water. Mouth moisturizer, mouth swabs, and chlorseptic spray offered and placed at pt bedside.  To note, pt has had a total of clear/no color NGT output in the last 3 hours. Empty bottle of sprite present on bedside table. Pt denies having anything to drink since NGT placement. Will continue to encourage and monitor adherence to NPO status.

## 2023-07-22 NOTE — Progress Notes (Signed)
 Attempted twice to put NGT in patient climbing out of the bed.  Stated that she needed to be sedated.  Nose started bleeding - large emesis of green substance with mucous- MD notified

## 2023-07-22 NOTE — Progress Notes (Signed)
 MD paged regarding NGT

## 2023-07-22 NOTE — Plan of Care (Signed)

## 2023-07-23 ENCOUNTER — Inpatient Hospital Stay (HOSPITAL_COMMUNITY)

## 2023-07-23 DIAGNOSIS — R0602 Shortness of breath: Secondary | ICD-10-CM | POA: Diagnosis not present

## 2023-07-23 LAB — COMPREHENSIVE METABOLIC PANEL WITH GFR
ALT: 15 U/L (ref 0–44)
AST: 16 U/L (ref 15–41)
Albumin: 3.8 g/dL (ref 3.5–5.0)
Alkaline Phosphatase: 55 U/L (ref 38–126)
Anion gap: 18 — ABNORMAL HIGH (ref 5–15)
BUN: 11 mg/dL (ref 6–20)
CO2: 23 mmol/L (ref 22–32)
Calcium: 9.2 mg/dL (ref 8.9–10.3)
Chloride: 103 mmol/L (ref 98–111)
Creatinine, Ser: 0.94 mg/dL (ref 0.44–1.00)
GFR, Estimated: >60 mL/min (ref >60)
Glucose, Bld: 70 mg/dL (ref 70–99)
Potassium: 3.7 mmol/L (ref 3.5–5.1)
Sodium: 144 mmol/L (ref 135–145)
Total Bilirubin: 1 mg/dL (ref 0.0–1.2)
Total Protein: 6.6 g/dL (ref 6.5–8.1)

## 2023-07-23 LAB — C-REACTIVE PROTEIN: CRP: 3.2 mg/dL — ABNORMAL HIGH (ref <1.0)

## 2023-07-23 LAB — PHOSPHORUS: Phosphorus: 3.1 mg/dL (ref 2.5–4.6)

## 2023-07-23 LAB — PROTIME-INR
INR: 1.1 (ref 0.8–1.2)
Prothrombin Time: 14.1 s (ref 11.4–15.2)

## 2023-07-23 LAB — CBC
HCT: 40.9 % (ref 36.0–46.0)
Hemoglobin: 13.6 g/dL (ref 12.0–15.0)
MCH: 30.7 pg (ref 26.0–34.0)
MCHC: 33.3 g/dL (ref 30.0–36.0)
MCV: 92.3 fL (ref 80.0–100.0)
Platelets: 214 10*3/uL (ref 150–400)
RBC: 4.43 MIL/uL (ref 3.87–5.11)
RDW: 12.7 % (ref 11.5–15.5)
WBC: 8.2 10*3/uL (ref 4.0–10.5)
nRBC: 0 % (ref 0.0–0.2)

## 2023-07-23 LAB — MAGNESIUM: Magnesium: 1.7 mg/dL (ref 1.7–2.4)

## 2023-07-23 MED ORDER — LACTATED RINGERS IV SOLN: INTRAVENOUS | Status: AC

## 2023-07-23 MED ORDER — MAGNESIUM SULFATE 2 GM/50ML IV SOLN
2.0000 g | Freq: Once | INTRAVENOUS | Status: AC
  Administered 2023-07-23: 2 g via INTRAVENOUS
  Filled 2023-07-23: qty 50

## 2023-07-23 MED ORDER — STERILE WATER FOR INJECTION IJ SOLN
INTRAMUSCULAR | Status: AC
  Administered 2023-07-23: 10 mL
  Filled 2023-07-23: qty 10

## 2023-07-23 NOTE — Progress Notes (Signed)
  Progress Note   Patient: Paula Cooley ZOX:[id number] DOB: 10/11/1990 DOA: 07/21/2023     1 DOS: the patient was seen and examined on 07/23/2023   Brief hospital course: 33 yo F w/ SBO. NG tube was placed by fluoroscopy as RN staff were unable to advance the tube. Once the NG tube was in she had 4.55L output in 24 hr. As of the morning of 07/23/2023 the pt continues with the NG tube in place as she has not yet had bowel function return. Appreciate surgery following along.  Assessment and Plan: Partial SBO with history of bowel resection & anastomosis - NPO  - Continue NG tube until bowel function returns  - IV LR 125 cc/hr  - IV dilaudid  1 mg q3 hr PRN  - IV zofran  4 mg q6 hr PRN  - IV protonix  40 mg q12  - Appreciate surgery following along    Hx of intermittent blood clot per rectum  - GI has advised outpt endoscopic evaluation (via telephone call w/ me)  - Stool guaiac negative   ADHD & GAD - IV ativan  0.5 mg q6 hr PRN   Subjective: Pt seen and examined at the bedside. S/p placement of NG tube by fluoroscopy staff yesterday. Excellent output via NG. Continue with NG until bowel function returns. Appreciate surgery following along.  Physical Exam: Vitals:   07/22/23 1954 07/23/23 0114 07/23/23 0523 07/23/23 0822  BP: 103/66 121/68 (!) 101/58 107/66  Pulse: 85 79 86 84  Resp: 16  16 17   Temp: 98.6 F (37 C)  99 F (37.2 C) 98.9 F (37.2 C)  TempSrc: Oral  Oral Oral  SpO2: 95%  96% 99%  Weight:      Height:       Physical Exam HENT:     Head: Normocephalic.   Cardiovascular:     Rate and Rhythm: Normal rate.  Pulmonary:     Effort: Pulmonary effort is normal.  Abdominal:     Palpations: Abdomen is soft.   Skin:    General: Skin is warm.   Neurological:     Mental Status: She is alert and oriented to person, place, and time.   Psychiatric:        Mood and Affect: Mood normal.      Disposition: Status is: Inpatient Remains inpatient appropriate because:  NG tube, IV fluids, serial labs  Planned Discharge Destination: Home    Time spent: 35 minutes  Author: Mikiah Demond , MD 07/23/2023 10:20 AM  For on call review [email or web address].

## 2023-07-23 NOTE — Plan of Care (Signed)

## 2023-07-23 NOTE — Progress Notes (Signed)
 Subjective: CC: NGT placed yesterday w/ 4.55L/24 hours. She reports she is not drinking or taking in ice chips since NGT placement.   She reports improved abdominal pain and nausea since placement. She still has intermittent mild nausea and some left sided abdominal pain but again better after placement of NGT. No flatus or BM.   Afebrile. No tachycardia or systolic hypotension. WBC wnl. Xray with contrast in colon and normal bowel gas pattern.   Objective: Vital signs in last 24 hours: Temp:  [97.3 F (36.3 C)-99 F (37.2 C)] 98.9 F (37.2 C) (06/12 0822) Pulse Rate:  [79-86] 84 (06/12 0822) Resp:  [14-18] 17 (06/12 0822) BP: (101-121)/(58-81) 107/66 (06/12 0822) SpO2:  [93 %-99 %] 99 % (06/12 0822) Last BM Date : 07/19/23  Intake/Output from previous day: 06/11 0701 - 06/12 0700 In: 1596 [I.V.:1596] Out: 4550 [Emesis/NG output:4550] Intake/Output this shift: No intake/output data recorded.  PE: Gen:  Alert, NAD, pleasant Abd: Soft, mild distension, some left sided ttp without rigidity or guarding, NGT with bilious output.   Lab Results:  Recent Labs    07/22/23 0612 07/23/23 0701  WBC 8.8 8.2  HGB 13.5 13.6  HCT 39.6 40.9  PLT 246 214   BMET Recent Labs    07/22/23 0612 07/23/23 0701  NA 138 144  K 3.6 3.7  CL 102 103  CO2 25 23  GLUCOSE 78 70  BUN 7 11  CREATININE 0.85 0.94  CALCIUM 9.3 9.2   PT/INR Recent Labs    07/23/23 0701  LABPROT 14.1  INR 1.1   CMP     Component Value Date/Time   NA 144 07/23/2023 0701   NA 143 04/09/2023 0834   K 3.7 07/23/2023 0701   CL 103 07/23/2023 0701   CO2 23 07/23/2023 0701   GLUCOSE 70 07/23/2023 0701   BUN 11 07/23/2023 0701   BUN 12 04/09/2023 0834   CREATININE 0.94 07/23/2023 0701   CALCIUM 9.2 07/23/2023 0701   PROT 6.6 07/23/2023 0701   PROT 6.6 04/09/2023 0834   ALBUMIN 3.8 07/23/2023 0701   ALBUMIN 4.5 04/09/2023 0834   AST 16 07/23/2023 0701   ALT 15 07/23/2023 0701   ALKPHOS 55  07/23/2023 0701   BILITOT 1.0 07/23/2023 0701   BILITOT <0.2 04/09/2023 0834   GFRNONAA >60 07/23/2023 0701   GFRAA >60 01/04/2019 0526   Lipase     Component Value Date/Time   LIPASE 33 07/21/2023 2150    Studies/Results: DG Abd Portable 1V Result Date: 07/23/2023 EXAM: 1 VIEW XRAY OF THE ABDOMEN SUPINE 07/23/2023 06:00:29 AM COMPARISON: 1 view abdomen 07/22/2023. CLINICAL HISTORY: SBO (small bowel obstruction) (HCC) [id number]. SBO; ROVER FINDINGS: BOWEL: Minimal residual contrast is present with thin a normal-appearing stomach. Contrast is mostly within the colon, suggesting normal transit. PERITONEUM AND SOFT TISSUES: No abnormal calcifications. BONES: No acute osseous abnormality. IMPRESSION: 1. Normal bowel gas pattern. No obstruction. Electronically signed by: Audree Leas MD 07/23/2023 06:12 AM EDT RP Workstation: [id number]   DG Abd 1 View Result Date: 07/22/2023 CLINICAL DATA:  NG tube placement EXAM: ABDOMEN - 1 VIEW COMPARISON:  None Available. FINDINGS: The nasogastric tube tip is in the stomach. Contrast is again noted within the dilated stomach. Limited visualization of small bowel. IMPRESSION: Nasogastric tube tip in the stomach. Electronically Signed   By: Juanetta Nordmann M.D.   On: 07/22/2023 18:04   DG Abd Portable 1V Result Date: 07/22/2023 CLINICAL DATA:  Nausea. EXAM: PORTABLE  ABDOMEN - 1 VIEW COMPARISON:  CT scan of same day. FINDINGS: There appears to be a large amount of contrast within the dilated stomach. Moderate small bowel dilatation is noted concerning for distal small bowel obstruction. No colonic dilatation is noted. Residual contrast is noted in urinary bladder. IMPRESSION: Large amount of contrast is noted within dilated stomach. Moderate small bowel dilatation is noted concerning for distal small bowel obstruction. Electronically Signed   By: Rosalene Colon M.D.   On: 07/22/2023 13:37   CT ABDOMEN PELVIS W CONTRAST Result Date: 07/22/2023 CLINICAL DATA:   Periumbilical pain, abdominal EXAM: CT ABDOMEN AND PELVIS WITH CONTRAST TECHNIQUE: Multidetector CT imaging of the abdomen and pelvis was performed using the standard protocol following bolus administration of intravenous contrast. RADIATION DOSE REDUCTION: This exam was performed according to the departmental dose-optimization program which includes automated exposure control, adjustment of the mA and/or kV according to patient size and/or use of iterative reconstruction technique. CONTRAST:  75mL OMNIPAQUE  IOHEXOL  350 MG/ML SOLN COMPARISON:  04/09/2021 FINDINGS: Lower chest: No acute abnormality Hepatobiliary: No focal hepatic abnormality. Gallbladder unremarkable. Pancreas: No focal abnormality or ductal dilatation. Spleen: No focal abnormality.  Normal size. Adrenals/Urinary Tract: Stable small cyst in the lower pole of the left kidney. No additional follow-up imaging recommended. No stones or hydronephrosis. Adrenal glands and urinary bladder unremarkable. Stomach/Bowel: Stomach is decompressed. Mildly dilated small bowel loops into the pelvis with scattered air-fluid levels. Distal small bowel is decompressed. Postoperative changes in the distal small bowel. Moderate stool burden throughout the colon. Vascular/Lymphatic: No evidence of aneurysm or adenopathy. Reproductive: Uterus and adnexa unremarkable. No mass. IUD in the uterus. Other: Small to moderate free fluid in the pelvis.  No free air. Musculoskeletal: No acute bony abnormality. IMPRESSION: Dilated small bowel loops into the pelvis with scattered air-fluid levels. Postoperative changes in the distal small bowel which is decompressed. Findings concerning for early partial small bowel obstruction. Electronically Signed   By: Janeece Mechanic M.D.   On: 07/22/2023 01:31    Anti-infectives: Anti-infectives (From admission, onward)    None        Assessment/Plan SBO - CT w/ above - Hx of robotic converted to open Meckel's diverticulum resection  in 2020. POD 3 was taken back for open LOA, reduction of internal hernia (2020). Underwent ex lap, LOA, SBR for SBO in 2023 - HDS without fever, tachycardia or systolic hypotension. No peritonitis on exam. WBC wnl. No current indication for emergency surgery - Keep K >=4, Phos >= 3, Mg >= 2 and mobilize for bowel function. Okay for NGT to be clamped for mobilization.  - Xray with contrast in colon and normal bowel gas pattern however with high NGT output over the last 24 hours, continued (but improved) pain/nausea and as patient has not had return of bowel function yet, will leave NGT to [facility] for now. Discussed w/ patient and RN plan that is patient has any flatus or BM today, NGT can be removed and she can be started on CLD.  - Hopefully patient will improve with conservative management. If patient fails to improve with conservative management, they may require exploratory surgery during admission - We will follow with you.   FEN - NPO, NGT to LIWS, IVF per TRH VTE - SCDs, okay for chem ppx from a general surgery standpoint ID - None   BRBPR - Agree with initial consult note. Stool guaiac neg. HDS. Hgb stable. Recommend EGD+colonoscopy as outpatient   I reviewed nursing notes, hospitalist  notes, last 24 h vitals and pain scores, last 48 h intake and output, last 24 h labs and trends, and last 24 h imaging results.   LOS: 1 day    Delton Filbert, [facility] 07/23/2023, 8:32 AM Please see Amion for pager number during day hours 7:00am-4:30pm

## 2023-07-24 DIAGNOSIS — K56609 Unspecified intestinal obstruction, unspecified as to partial versus complete obstruction: Secondary | ICD-10-CM | POA: Diagnosis not present

## 2023-07-24 DIAGNOSIS — Z9049 Acquired absence of other specified parts of digestive tract: Secondary | ICD-10-CM | POA: Diagnosis not present

## 2023-07-24 DIAGNOSIS — R0602 Shortness of breath: Secondary | ICD-10-CM | POA: Diagnosis not present

## 2023-07-24 DIAGNOSIS — K625 Hemorrhage of anus and rectum: Secondary | ICD-10-CM | POA: Diagnosis not present

## 2023-07-24 LAB — CBC
HCT: 37.5 % (ref 36.0–46.0)
Hemoglobin: 12.4 g/dL (ref 12.0–15.0)
MCH: 30.6 pg (ref 26.0–34.0)
MCHC: 33.1 g/dL (ref 30.0–36.0)
MCV: 92.6 fL (ref 80.0–100.0)
Platelets: 189 10*3/uL (ref 150–400)
RBC: 4.05 MIL/uL (ref 3.87–5.11)
RDW: 12.5 % (ref 11.5–15.5)
WBC: 10.7 10*3/uL — ABNORMAL HIGH (ref 4.0–10.5)
nRBC: 0 % (ref 0.0–0.2)

## 2023-07-24 LAB — COMPREHENSIVE METABOLIC PANEL WITH GFR
ALT: 15 U/L (ref 0–44)
AST: 18 U/L (ref 15–41)
Albumin: 3.7 g/dL (ref 3.5–5.0)
Alkaline Phosphatase: 63 U/L (ref 38–126)
Anion gap: 13 (ref 5–15)
BUN: 10 mg/dL (ref 6–20)
CO2: 21 mmol/L — ABNORMAL LOW (ref 22–32)
Calcium: 8.7 mg/dL — ABNORMAL LOW (ref 8.9–10.3)
Chloride: 104 mmol/L (ref 98–111)
Creatinine, Ser: 0.98 mg/dL (ref 0.44–1.00)
GFR, Estimated: >60 mL/min (ref >60)
Glucose, Bld: 64 mg/dL — ABNORMAL LOW (ref 70–99)
Potassium: 3.7 mmol/L (ref 3.5–5.1)
Sodium: 138 mmol/L (ref 135–145)
Total Bilirubin: 1 mg/dL (ref 0.0–1.2)
Total Protein: 6.5 g/dL (ref 6.5–8.1)

## 2023-07-24 LAB — C-REACTIVE PROTEIN: CRP: 6.7 mg/dL — ABNORMAL HIGH (ref <1.0)

## 2023-07-24 LAB — MAGNESIUM: Magnesium: 1.8 mg/dL (ref 1.7–2.4)

## 2023-07-24 LAB — PHOSPHORUS: Phosphorus: 3.1 mg/dL (ref 2.5–4.6)

## 2023-07-24 MED ORDER — CITALOPRAM HYDROBROMIDE 20 MG PO TABS
20.0000 mg | ORAL_TABLET | Freq: Every day | ORAL | Status: DC
  Administered 2023-07-24 – 2023-07-27 (×4): 20 mg via ORAL
  Filled 2023-07-24 (×4): qty 1

## 2023-07-24 MED ORDER — BUPROPION HCL ER (XL) 150 MG PO TB24
300.0000 mg | ORAL_TABLET | Freq: Every evening | ORAL | Status: DC
  Administered 2023-07-24 – 2023-07-26 (×3): 300 mg via ORAL
  Filled 2023-07-24 (×3): qty 2

## 2023-07-24 MED ORDER — BISACODYL 10 MG RE SUPP
10.0000 mg | Freq: Once | RECTAL | Status: AC
  Administered 2023-07-24: 10 mg via RECTAL
  Filled 2023-07-24: qty 1

## 2023-07-24 NOTE — Progress Notes (Signed)
 PROGRESS NOTE  Paula Cooley:[id number] DOB: 07-12-1990   PCP: Pcp, No  Patient is from: Home.  DOA: 07/21/2023 LOS: 2  Chief complaints Chief Complaint  Patient presents with   Abdominal Pain     Brief Narrative / Interim history: 33 year old F with PMH of multiple abdominal surgeries including open Meckel's diverticulum resection in 2020, and ex lap, LOA, SBR for SBO in 2023, and mood disorder presenting with abdominal pain with associated nausea and vomiting, and admitted with small bowel obstruction as noted on CT abdomen and pelvis.  General surgery consulted.  She had NG tube placed under fluoroscopy with immediate return of 4.5 L gastric contents.  Repeat KUB on 6/12 with normal bowel gas pattern and no obstruction.  NG tube output decreased significantly, and she tolerated NG tube clamping on 6/13.  She also had flatus.  NG tube removed.  Started on clear liquid diet.   Subjective: Seen and examined earlier this morning.  No major events overnight or this morning.  Reports passing gas this morning.  Denies nausea or vomiting.  Abdominal pain improved.  Objective: Vitals:   07/23/23 1629 07/23/23 2005 07/24/23 0535 07/24/23 0829  BP: (!) 93/52 105/71 (!) 101/52 (!) 100/59  Pulse: 87 78 84 96  Resp: 17 16 16 17   Temp: 98.6 F (37 C) 99.5 F (37.5 C) 99.1 F (37.3 C) 99.1 F (37.3 C)  TempSrc: Oral Oral Oral Oral  SpO2: 96% 100% 99% 100%  Weight:      Height:        Examination:  GENERAL: No apparent distress.  Nontoxic. HEENT: MMM.  Vision and hearing grossly intact.  NECK: Supple.  No apparent JVD.  RESP:  No IWOB.  Fair aeration bilaterally. CVS:  RRR. Heart sounds normal.  ABD/GI/GU: BS+. Abd soft, NTND.  MSK/EXT:  Moves extremities. No apparent deformity. No edema.  SKIN: no apparent skin lesion or wound NEURO: AA.  Oriented appropriately.  No apparent focal neuro deficit. PSYCH: Calm. Normal affect.   Consultants:  General  surgery  Procedures: Fluoroscopic NG tube placement on 6/11  Microbiology summarized: None  Assessment and plan: Partial SBO with history of bowel resection & anastomosis and prior SBO for which she had ex lap, LOA and SBR. -SBO seems to have resolved clinically and radiologically. -NG tube removed and started on clears by surgery. -Continue PPI and antiemetics. -Mobilize patient   Hx of intermittent blood clot per rectum/BRBPR: Hemoccult negative. - Per previous attending, GI has advised outpt endoscopic evaluation after discussion over the phone. - Stool guaiac negative    Mood disorder - Resume home Celexa and Wellbutrin  Class I obesity Body mass index is 30.14 kg/m.          DVT prophylaxis:  SCDs Start: 07/22/23 0225 Place TED hose Start: 07/22/23 0225  Code Status: Full code Family Communication: None at bedside Level of care: Med-Surg Status is: Inpatient Remains inpatient appropriate because: Due to SBO   Final disposition: Home   35 minutes with more than 50% spent in reviewing records, counseling patient/family and coordinating care.   Sch Meds:  Scheduled Meds:  buPROPion  300 mg Oral QPM   citalopram  20 mg Oral Daily   pantoprazole  (PROTONIX ) IV  40 mg Intravenous Q12H   sodium chloride  flush  3 mL Intravenous Q12H   Continuous Infusions: PRN Meds:.acetaminophen , dextrose , HYDROmorphone  (DILAUDID ) injection, LORazepam , morphine  injection, ondansetron  **OR** ondansetron  (ZOFRAN ) IV, phenol  Antimicrobials: Anti-infectives (From admission, onward)  None        I have personally reviewed the following labs and images: CBC: Recent Labs  Lab 07/21/23 2150 07/22/23 0612 07/23/23 0701 07/24/23 0551  WBC 5.6 8.8 8.2 10.7*  HGB 14.2 13.5 13.6 12.4  HCT 41.4 39.6 40.9 37.5  MCV 90.4 89.0 92.3 92.6  PLT 227 246 214 189   BMP &GFR Recent Labs  Lab 07/21/23 2150 07/22/23 0612 07/23/23 0701 07/24/23 0551  NA 139 138 144 138  K  3.7 3.6 3.7 3.7  CL 103 102 103 104  CO2 26 25 23  21*  GLUCOSE 102* 78 70 64*  BUN 12 7 11 10   CREATININE 0.76 0.85 0.94 0.98  CALCIUM 9.5 9.3 9.2 8.7*  MG  --   --  1.7 1.8  PHOS  --   --  3.1 3.1   Estimated Creatinine Clearance: 90.4 mL/min (by C-G formula based on SCr of 0.98 mg/dL). Liver & Pancreas: Recent Labs  Lab 07/21/23 2150 07/23/23 0701 07/24/23 0551  AST 19 16 18   ALT 19 15 15   ALKPHOS 61 55 63  BILITOT 0.7 1.0 1.0  PROT 7.7 6.6 6.5  ALBUMIN 4.5 3.8 3.7   Recent Labs  Lab 07/21/23 2150  LIPASE 33   No results for input(s): AMMONIA in the last 168 hours. Diabetic: No results for input(s): HGBA1C in the last 72 hours. Recent Labs  Lab 07/22/23 0405  GLUCAP 82   Cardiac Enzymes: No results for input(s): CKTOTAL, CKMB, CKMBINDEX, TROPONINI in the last 168 hours. No results for input(s): PROBNP in the last 8760 hours. Coagulation Profile: Recent Labs  Lab 07/23/23 0701  INR 1.1   Thyroid Function Tests: No results for input(s): TSH, T4TOTAL, FREET4, T3FREE, THYROIDAB in the last 72 hours. Lipid Profile: No results for input(s): CHOL, HDL, LDLCALC, TRIG, CHOLHDL, LDLDIRECT in the last 72 hours. Anemia Panel: No results for input(s): VITAMINB12, FOLATE, FERRITIN, TIBC, IRON, RETICCTPCT in the last 72 hours. Urine analysis:    Component Value Date/Time   COLORURINE YELLOW 07/21/2023 0052   APPEARANCEUR HAZY (A) 07/21/2023 0052   LABSPEC 1.026 07/21/2023 0052   PHURINE 6.0 07/21/2023 0052   GLUCOSEU NEGATIVE 07/21/2023 0052   HGBUR NEGATIVE 07/21/2023 0052   BILIRUBINUR NEGATIVE 07/21/2023 0052   BILIRUBINUR neg 09/06/2012 1355   KETONESUR 20 (A) 07/21/2023 0052   PROTEINUR 30 (A) 07/21/2023 0052   UROBILINOGEN negative 09/06/2012 1355   NITRITE NEGATIVE 07/21/2023 0052   LEUKOCYTESUR NEGATIVE 07/21/2023 0052   Sepsis Labs: Invalid input(s): PROCALCITONIN, LACTICIDVEN  Microbiology: No  results found for this or any previous visit (from the past 240 hours).  Radiology Studies: No results found.    Rhilynn Preyer T. Duglas Heier [facility]  If 7PM-7AM, please contact night-coverage [email or web address] 07/24/2023, 1:38 PM

## 2023-07-24 NOTE — Plan of Care (Signed)
?  Problem: Education: ?Goal: Knowledge of General Education information will improve ?Description: Including pain rating scale, medication(s)/side effects and non-pharmacologic comfort measures ?Outcome: Progressing ?  ?Problem: Clinical Measurements: ?Goal: Ability to maintain clinical measurements within normal limits will improve ?Outcome: Progressing ?  ?Problem: Nutrition: ?Goal: Adequate nutrition will be maintained ?Outcome: Progressing ?  ?Problem: Elimination: ?Goal: Will not experience complications related to bowel motility ?Outcome: Progressing ?  ?

## 2023-07-24 NOTE — Plan of Care (Signed)
  Problem: Education: Goal: Knowledge of General Education information will improve Description: Including pain rating scale, medication(s)/side effects and non-pharmacologic comfort measures Outcome: Progressing   Problem: Health Behavior/Discharge Planning: Goal: Ability to manage health-related needs will improve Outcome: Progressing   Problem: Clinical Measurements: Goal: Ability to maintain clinical measurements within normal limits will improve Outcome: Progressing Goal: Will remain free from infection Outcome: Progressing Goal: Diagnostic test results will improve Outcome: Progressing   Problem: Activity: Goal: Risk for activity intolerance will decrease Outcome: Progressing   Problem: Coping: Goal: Level of anxiety will decrease Outcome: Progressing   Problem: Pain Managment: Goal: General experience of comfort will improve and/or be controlled Outcome: Progressing   Problem: Safety: Goal: Ability to remain free from injury will improve Outcome: Progressing   Problem: Skin Integrity: Goal: Risk for impaired skin integrity will decrease Outcome: Progressing

## 2023-07-24 NOTE — Progress Notes (Signed)
 Subjective: CC: Left sided abdominal pain still present but improved. No nausea this am. NGT output 2.2L/24 hours but output downtrending over the last 12 hours. She is mobilizing with NGT clamped. No flatus or BM.   Objective: Vital signs in last 24 hours: Temp:  [98.6 F (37 C)-99.5 F (37.5 C)] 99.1 F (37.3 C) (06/13 0535) Pulse Rate:  [78-87] 84 (06/13 0535) Resp:  [16-17] 16 (06/13 0535) BP: (93-105)/(52-71) 101/52 (06/13 0535) SpO2:  [96 %-100 %] 99 % (06/13 0535) Last BM Date : 07/19/23  Intake/Output from previous day: 06/12 0701 - 06/13 0700 In: 2365.5 [I.V.:2365.5] Out: 2200 [Emesis/NG output:2200] Intake/Output this shift: No intake/output data recorded.  PE: Gen:  Alert, NAD, pleasant Abd: Soft, improved mild distension, some left sided ttp that is improved today and without rigidity or guarding, NGT with bilious output.   Lab Results:  Recent Labs    07/23/23 0701 07/24/23 0551  WBC 8.2 10.7*  HGB 13.6 12.4  HCT 40.9 37.5  PLT 214 189   BMET Recent Labs    07/23/23 0701 07/24/23 0551  NA 144 138  K 3.7 3.7  CL 103 104  CO2 23 21*  GLUCOSE 70 64*  BUN 11 10  CREATININE 0.94 0.98  CALCIUM 9.2 8.7*   PT/INR Recent Labs    07/23/23 0701  LABPROT 14.1  INR 1.1   CMP     Component Value Date/Time   NA 138 07/24/2023 0551   NA 143 04/09/2023 0834   K 3.7 07/24/2023 0551   CL 104 07/24/2023 0551   CO2 21 (L) 07/24/2023 0551   GLUCOSE 64 (L) 07/24/2023 0551   BUN 10 07/24/2023 0551   BUN 12 04/09/2023 0834   CREATININE 0.98 07/24/2023 0551   CALCIUM 8.7 (L) 07/24/2023 0551   PROT 6.5 07/24/2023 0551   PROT 6.6 04/09/2023 0834   ALBUMIN 3.7 07/24/2023 0551   ALBUMIN 4.5 04/09/2023 0834   AST 18 07/24/2023 0551   ALT 15 07/24/2023 0551   ALKPHOS 63 07/24/2023 0551   BILITOT 1.0 07/24/2023 0551   BILITOT <0.2 04/09/2023 0834   GFRNONAA >60 07/24/2023 0551   GFRAA >60 01/04/2019 0526   Lipase     Component Value Date/Time    LIPASE 33 07/21/2023 2150    Studies/Results: DG Abd Portable 1V Result Date: 07/23/2023 EXAM: 1 VIEW XRAY OF THE ABDOMEN SUPINE 07/23/2023 06:00:29 AM COMPARISON: 1 view abdomen 07/22/2023. CLINICAL HISTORY: SBO (small bowel obstruction) (HCC) [id number]. SBO; ROVER FINDINGS: BOWEL: Minimal residual contrast is present with thin a normal-appearing stomach. Contrast is mostly within the colon, suggesting normal transit. PERITONEUM AND SOFT TISSUES: No abnormal calcifications. BONES: No acute osseous abnormality. IMPRESSION: 1. Normal bowel gas pattern. No obstruction. Electronically signed by: Audree Leas MD 07/23/2023 06:12 AM EDT RP Workstation: [id number]   DG Abd 1 View Result Date: 07/22/2023 CLINICAL DATA:  NG tube placement EXAM: ABDOMEN - 1 VIEW COMPARISON:  None Available. FINDINGS: The nasogastric tube tip is in the stomach. Contrast is again noted within the dilated stomach. Limited visualization of small bowel. IMPRESSION: Nasogastric tube tip in the stomach. Electronically Signed   By: Juanetta Nordmann M.D.   On: 07/22/2023 18:04   DG Abd Portable 1V Result Date: 07/22/2023 CLINICAL DATA:  Nausea. EXAM: PORTABLE ABDOMEN - 1 VIEW COMPARISON:  CT scan of same day. FINDINGS: There appears to be a large amount of contrast within the dilated stomach. Moderate small bowel dilatation is noted  concerning for distal small bowel obstruction. No colonic dilatation is noted. Residual contrast is noted in urinary bladder. IMPRESSION: Large amount of contrast is noted within dilated stomach. Moderate small bowel dilatation is noted concerning for distal small bowel obstruction. Electronically Signed   By: Rosalene Colon M.D.   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 POD 3 was taken back for open LOA, reduction of internal  hernia (2020). Underwent ex lap, LOA, SBR for SBO in 2023 - No current indication for emergency surgery - Keep K >=4, Phos >= 3, Mg >= 2 and mobilize for bowel function. Okay for NGT to be clamped for mobilization.  - Xray with contrast in colon and normal bowel gas pattern 6/12 however with high NGT output over the last 24 hours, continued (but improved) pain and as patient has not had return of bowel function yet, will leave NGT to [facility] for now. Will give suppository. Discussed w/ patient and RN plan that is patient has any flatus or BM today, NGT can be removed and she can be started on CLD. Could also consider clamping trial later today.  - Hopefully patient will improve with conservative management. If patient fails to improve with conservative management, they may require exploratory surgery during admission - We will follow with you.   FEN - NPO, NGT to LIWS, IVF per [facility] VTE - SCDs, okay for chem ppx from a general surgery standpoint ID - None   BRBPR - Agree with initial consult note. Stool guaiac neg. HDS. Hgb stable. Recommend EGD+colonoscopy as outpatient   I reviewed nursing notes, hospitalist notes, last 24 h vitals and pain scores, last 48 h intake and output, last 24 h labs and trends, and last 24 h imaging results.   LOS: 2 days    Delton Filbert, [facility] 07/24/2023, 8:24 AM Please see Amion for pager number during day hours 7:00am-4:30pm

## 2023-07-25 DIAGNOSIS — K625 Hemorrhage of anus and rectum: Secondary | ICD-10-CM | POA: Diagnosis not present

## 2023-07-25 DIAGNOSIS — Z9049 Acquired absence of other specified parts of digestive tract: Secondary | ICD-10-CM | POA: Diagnosis not present

## 2023-07-25 DIAGNOSIS — R0602 Shortness of breath: Secondary | ICD-10-CM | POA: Diagnosis not present

## 2023-07-25 DIAGNOSIS — K56609 Unspecified intestinal obstruction, unspecified as to partial versus complete obstruction: Secondary | ICD-10-CM | POA: Diagnosis not present

## 2023-07-25 LAB — CBC
HCT: 39.7 % (ref 36.0–46.0)
Hemoglobin: 13.4 g/dL (ref 12.0–15.0)
MCH: 30.9 pg (ref 26.0–34.0)
MCHC: 33.8 g/dL (ref 30.0–36.0)
MCV: 91.5 fL (ref 80.0–100.0)
Platelets: 218 10*3/uL (ref 150–400)
RBC: 4.34 MIL/uL (ref 3.87–5.11)
RDW: 12.3 % (ref 11.5–15.5)
WBC: 5.6 10*3/uL (ref 4.0–10.5)
nRBC: 0 % (ref 0.0–0.2)

## 2023-07-25 LAB — COMPREHENSIVE METABOLIC PANEL WITH GFR
ALT: 16 U/L (ref 0–44)
AST: 18 U/L (ref 15–41)
Albumin: 4 g/dL (ref 3.5–5.0)
Alkaline Phosphatase: 55 U/L (ref 38–126)
Anion gap: 11 (ref 5–15)
BUN: 6 mg/dL (ref 6–20)
CO2: 25 mmol/L (ref 22–32)
Calcium: 9.3 mg/dL (ref 8.9–10.3)
Chloride: 99 mmol/L (ref 98–111)
Creatinine, Ser: 0.82 mg/dL (ref 0.44–1.00)
GFR, Estimated: >60 mL/min (ref >60)
Glucose, Bld: 146 mg/dL — ABNORMAL HIGH (ref 70–99)
Potassium: 4.2 mmol/L (ref 3.5–5.1)
Sodium: 135 mmol/L (ref 135–145)
Total Bilirubin: 0.8 mg/dL (ref 0.0–1.2)
Total Protein: 7.2 g/dL (ref 6.5–8.1)

## 2023-07-25 LAB — MAGNESIUM: Magnesium: 1.8 mg/dL (ref 1.7–2.4)

## 2023-07-25 LAB — C-REACTIVE PROTEIN: CRP: 7.9 mg/dL — ABNORMAL HIGH (ref <1.0)

## 2023-07-25 LAB — SEDIMENTATION RATE: Sed Rate: 25 mm/h — ABNORMAL HIGH (ref 0–22)

## 2023-07-25 LAB — PHOSPHORUS: Phosphorus: 2.2 mg/dL — ABNORMAL LOW (ref 2.5–4.6)

## 2023-07-25 MED ORDER — PANTOPRAZOLE SODIUM 40 MG PO TBEC
40.0000 mg | DELAYED_RELEASE_TABLET | Freq: Two times a day (BID) | ORAL | Status: DC
  Administered 2023-07-25 – 2023-07-27 (×4): 40 mg via ORAL
  Filled 2023-07-25 (×4): qty 1

## 2023-07-25 MED ORDER — OXYCODONE HCL 5 MG PO TABS
5.0000 mg | ORAL_TABLET | Freq: Four times a day (QID) | ORAL | Status: DC | PRN
  Administered 2023-07-25 – 2023-07-26 (×4): 5 mg via ORAL
  Filled 2023-07-25 (×5): qty 1

## 2023-07-25 NOTE — Progress Notes (Signed)
 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 POD 3 was taken back for open LOA, reduction of internal hernia (2020). Underwent ex lap, LOA, SBR for SBO in 2023 - NG out, tolerating clear liquid diet, some left sided abd pain - encouraged OOB, ambulation in halls - will advance to soft diet this AM and monitor for next 24 hours   FEN - soft diet, IVF per [facility] VTE - SCDs, okay for chem ppx from a general surgery standpoint ID - None   BRBPR - Agree with initial consult note. Stool guaiac neg. HDS. Hgb stable. Recommend EGD+colonoscopy as outpatient         [name], MD [facility] A [facility] practice Office: [phone]        Chief Complaint: SBO  Subjective: Patient pleasant, comfortable this AM.  Passing flatus, BM yesterday AM.  Denies nausea.  Objective: Vital signs in last 24 hours: Temp:  [97.9 F (36.6 C)-99.2 F (37.3 C)] 97.9 F (36.6 C) (06/14 0750) Pulse Rate:  [70-86] 70 (06/14 0750) Resp:  [17-18] 18 (06/14 0750) BP: (105-109)/(59-81) 109/81 (06/14 0750) SpO2:  [98 %-100 %] 100 % (06/14 0750) Last BM Date : 07/24/23  Intake/Output from previous day: 06/13 0701 - 06/14 0700 In: 720 [P.O.:720] Out: -  Intake/Output this shift: Total I/O In: 710 [P.O.:710] Out: -   Physical Exam: HEENT - sclerae clear, mucous membranes moist Abdomen - soft without distension; non-tender; no mass  Lab Results:  Recent Labs    07/23/23 0701 07/24/23 0551  WBC 8.2 10.7*  HGB 13.6 12.4  HCT 40.9 37.5  PLT 214 189   BMET Recent Labs    07/23/23 0701 07/24/23 0551  NA 144 138  K 3.7 3.7  CL 103 104  CO2 23 21*  GLUCOSE 70 64*  BUN 11 10  CREATININE 0.94 0.98  CALCIUM 9.2 8.7*   PT/INR Recent Labs    07/23/23 0701  LABPROT 14.1  INR 1.1   Comprehensive Metabolic Panel:    Component Value Date/Time   NA 138 07/24/2023 0551   NA 144 07/23/2023 0701   NA 143  04/09/2023 0834   K 3.7 07/24/2023 0551   K 3.7 07/23/2023 0701   CL 104 07/24/2023 0551   CL 103 07/23/2023 0701   CO2 21 (L) 07/24/2023 0551   CO2 23 07/23/2023 0701   BUN 10 07/24/2023 0551   BUN 11 07/23/2023 0701   BUN 12 04/09/2023 0834   BUN 12 07/14/2022 0000   CREATININE 0.98 07/24/2023 0551   CREATININE 0.94 07/23/2023 0701   GLUCOSE 64 (L) 07/24/2023 0551   GLUCOSE 70 07/23/2023 0701   CALCIUM 8.7 (L) 07/24/2023 0551   CALCIUM 9.2 07/23/2023 0701   AST 18 07/24/2023 0551   AST 16 07/23/2023 0701   ALT 15 07/24/2023 0551   ALT 15 07/23/2023 0701   ALKPHOS 63 07/24/2023 0551   ALKPHOS 55 07/23/2023 0701   BILITOT 1.0 07/24/2023 0551   BILITOT 1.0 07/23/2023 0701   BILITOT <0.2 04/09/2023 0834   PROT 6.5 07/24/2023 0551   PROT 6.6 07/23/2023 0701   PROT 6.6 04/09/2023 0834   ALBUMIN 3.7 07/24/2023 0551   ALBUMIN 3.8 07/23/2023 0701   ALBUMIN 4.5 04/09/2023 0834    Studies/Results: No results found.    [name] 07/25/2023  Patient ID: Paula Cooley, female   DOB: 1990/10/14, 33 y.o.   MRN: [id number]

## 2023-07-25 NOTE — Progress Notes (Signed)
 PROGRESS NOTE  Paula Cooley:[id number] DOB: Sep 04, 1990   PCP: Pcp, No  Patient is from: Home.  DOA: 07/21/2023 LOS: 3  Chief complaints Chief Complaint  Patient presents with   Abdominal Pain     Brief Narrative / Interim history: 33 year old F with PMH of multiple abdominal surgeries including open Meckel's diverticulum resection in 2020, and ex lap, LOA, SBR for SBO in 2023, and mood disorder presenting with abdominal pain with associated nausea and vomiting, and admitted with small bowel obstruction as noted on CT abdomen and pelvis.  General surgery consulted.  She had NG tube placed under fluoroscopy with immediate return of 4.5 L gastric contents.  Repeat KUB on 6/12 with normal bowel gas pattern and no obstruction.  NG tube output decreased significantly, and she tolerated NG tube clamping on 6/13.  She also had flatus and bowel movement.  NG tube removed and started on clear liquid diet on 6/13 and advance to soft diet on 6/14.   Subjective: Seen and examined earlier this morning.  No major events overnight or this morning.  Advanced to soft diet by general surgery this morning.  Objective: Vitals:   07/24/23 2035 07/25/23 0545 07/25/23 0750 07/25/23 1543  BP: 106/69 105/64 109/81 104/74  Pulse: 78 84 70 80  Resp: 18 18 18 18   Temp: 99.2 F (37.3 C) 98.3 F (36.8 C) 97.9 F (36.6 C) 98.2 F (36.8 C)  TempSrc: Oral Oral Oral Oral  SpO2: 98% 99% 100% 97%  Weight:      Height:        Examination:  GENERAL: No apparent distress.  Nontoxic. HEENT: MMM.  Vision and hearing grossly intact.  NECK: Supple.  No apparent JVD.  RESP:  No IWOB.  Fair aeration bilaterally. CVS:  RRR. Heart sounds normal.  ABD/GI/GU: BS+. Abd soft.  Nontender. MSK/EXT:  Moves extremities. No apparent deformity. No edema.  SKIN: no apparent skin lesion or wound NEURO: AA.  Oriented appropriately.  No apparent focal neuro deficit. PSYCH: Calm. Normal affect.   Consultants:  General  surgery  Procedures: Fluoroscopic NG tube placement on 6/11  Microbiology summarized: None  Assessment and plan: Partial SBO with history of bowel resection & anastomosis and prior SBO for which she had ex lap, LOA and SBR. SBO seems to have resolved clinically and radiologically.  Tolerated CLD. -Advance to soft diet by general surgery -Continue PPI, analgesics and antiemetics. -Mobilize every 4 hours.   Hx of intermittent blood clot per rectum/BRBPR: Hemoccult negative. - Per previous attending, GI has advised outpt endoscopic evaluation after discussion over the phone. - Stool guaiac negative    Mood disorder -Continue home Celexa and Wellbutrin  Class I obesity Body mass index is 30.14 kg/m. - Encourage lifestyle change to lose weight.         DVT prophylaxis:  SCDs Start: 07/22/23 0225 Place TED hose Start: 07/22/23 0225  Code Status: Full code Family Communication: None at bedside Level of care: Med-Surg Status is: Inpatient Remains inpatient appropriate because: Due to SBO   Final disposition: Home   35 minutes with more than 50% spent in reviewing records, counseling patient/family and coordinating care.   Sch Meds:  Scheduled Meds:  buPROPion  300 mg Oral QPM   citalopram  20 mg Oral Daily   pantoprazole   40 mg Oral BID   sodium chloride  flush  3 mL Intravenous Q12H   Continuous Infusions: PRN Meds:.acetaminophen , dextrose , HYDROmorphone  (DILAUDID ) injection, LORazepam , morphine  injection, ondansetron  **OR** ondansetron  (  ZOFRAN ) IV, oxyCODONE , phenol  Antimicrobials: Anti-infectives (From admission, onward)    None        I have personally reviewed the following labs and images: CBC: Recent Labs  Lab 07/21/23 2150 07/22/23 0612 07/23/23 0701 07/24/23 0551 07/25/23 0821  WBC 5.6 8.8 8.2 10.7* 5.6  HGB 14.2 13.5 13.6 12.4 13.4  HCT 41.4 39.6 40.9 37.5 39.7  MCV 90.4 89.0 92.3 92.6 91.5  PLT 227 246 214 189 218   BMP &GFR Recent  Labs  Lab 07/21/23 2150 07/22/23 0612 07/23/23 0701 07/24/23 0551 07/25/23 0821  NA 139 138 144 138 135  K 3.7 3.6 3.7 3.7 4.2  CL 103 102 103 104 99  CO2 26 25 23  21* 25  GLUCOSE 102* 78 70 64* 146*  BUN 12 7 11 10 6   CREATININE 0.76 0.85 0.94 0.98 0.82  CALCIUM 9.5 9.3 9.2 8.7* 9.3  MG  --   --  1.7 1.8 1.8  PHOS  --   --  3.1 3.1 2.2*   Estimated Creatinine Clearance: 108.1 mL/min (by C-G formula based on SCr of 0.82 mg/dL). Liver & Pancreas: Recent Labs  Lab 07/21/23 2150 07/23/23 0701 07/24/23 0551 07/25/23 0821  AST 19 16 18 18   ALT 19 15 15 16   ALKPHOS 61 55 63 55  BILITOT 0.7 1.0 1.0 0.8  PROT 7.7 6.6 6.5 7.2  ALBUMIN 4.5 3.8 3.7 4.0   Recent Labs  Lab 07/21/23 2150  LIPASE 33   No results for input(s): AMMONIA in the last 168 hours. Diabetic: No results for input(s): HGBA1C in the last 72 hours. Recent Labs  Lab 07/22/23 0405  GLUCAP 82   Cardiac Enzymes: No results for input(s): CKTOTAL, CKMB, CKMBINDEX, TROPONINI in the last 168 hours. No results for input(s): PROBNP in the last 8760 hours. Coagulation Profile: Recent Labs  Lab 07/23/23 0701  INR 1.1   Thyroid Function Tests: No results for input(s): TSH, T4TOTAL, FREET4, T3FREE, THYROIDAB in the last 72 hours. Lipid Profile: No results for input(s): CHOL, HDL, LDLCALC, TRIG, CHOLHDL, LDLDIRECT in the last 72 hours. Anemia Panel: No results for input(s): VITAMINB12, FOLATE, FERRITIN, TIBC, IRON, RETICCTPCT in the last 72 hours. Urine analysis:    Component Value Date/Time   COLORURINE YELLOW 07/21/2023 0052   APPEARANCEUR HAZY (A) 07/21/2023 0052   LABSPEC 1.026 07/21/2023 0052   PHURINE 6.0 07/21/2023 0052   GLUCOSEU NEGATIVE 07/21/2023 0052   HGBUR NEGATIVE 07/21/2023 0052   BILIRUBINUR NEGATIVE 07/21/2023 0052   BILIRUBINUR neg 09/06/2012 1355   KETONESUR 20 (A) 07/21/2023 0052   PROTEINUR 30 (A) 07/21/2023 0052   UROBILINOGEN  negative 09/06/2012 1355   NITRITE NEGATIVE 07/21/2023 0052   LEUKOCYTESUR NEGATIVE 07/21/2023 0052   Sepsis Labs: Invalid input(s): PROCALCITONIN, LACTICIDVEN  Microbiology: No results found for this or any previous visit (from the past 240 hours).  Radiology Studies: No results found.    Ruey Storer T. Teondre Jarosz [facility]  If 7PM-7AM, please contact night-coverage [email or web address] 07/25/2023, 4:12 PM

## 2023-07-26 ENCOUNTER — Inpatient Hospital Stay (HOSPITAL_COMMUNITY)

## 2023-07-26 DIAGNOSIS — K625 Hemorrhage of anus and rectum: Secondary | ICD-10-CM | POA: Diagnosis not present

## 2023-07-26 DIAGNOSIS — Z9049 Acquired absence of other specified parts of digestive tract: Secondary | ICD-10-CM | POA: Diagnosis not present

## 2023-07-26 DIAGNOSIS — R0602 Shortness of breath: Secondary | ICD-10-CM | POA: Diagnosis not present

## 2023-07-26 DIAGNOSIS — K56609 Unspecified intestinal obstruction, unspecified as to partial versus complete obstruction: Secondary | ICD-10-CM | POA: Diagnosis not present

## 2023-07-26 LAB — CBC
HCT: 39.5 % (ref 36.0–46.0)
Hemoglobin: 13.3 g/dL (ref 12.0–15.0)
MCH: 30.4 pg (ref 26.0–34.0)
MCHC: 33.7 g/dL (ref 30.0–36.0)
MCV: 90.4 fL (ref 80.0–100.0)
Platelets: 211 10*3/uL (ref 150–400)
RBC: 4.37 MIL/uL (ref 3.87–5.11)
RDW: 12.2 % (ref 11.5–15.5)
WBC: 5.7 10*3/uL (ref 4.0–10.5)
nRBC: 0 % (ref 0.0–0.2)

## 2023-07-26 LAB — PHOSPHORUS: Phosphorus: 2.8 mg/dL (ref 2.5–4.6)

## 2023-07-26 LAB — COMPREHENSIVE METABOLIC PANEL WITH GFR
ALT: 15 U/L (ref 0–44)
AST: 20 U/L (ref 15–41)
Albumin: 3.9 g/dL (ref 3.5–5.0)
Alkaline Phosphatase: 52 U/L (ref 38–126)
Anion gap: 12 (ref 5–15)
BUN: 7 mg/dL (ref 6–20)
CO2: 21 mmol/L — ABNORMAL LOW (ref 22–32)
Calcium: 9.4 mg/dL (ref 8.9–10.3)
Chloride: 104 mmol/L (ref 98–111)
Creatinine, Ser: 0.75 mg/dL (ref 0.44–1.00)
GFR, Estimated: >60 mL/min (ref >60)
Glucose, Bld: 118 mg/dL — ABNORMAL HIGH (ref 70–99)
Potassium: 3.7 mmol/L (ref 3.5–5.1)
Sodium: 137 mmol/L (ref 135–145)
Total Bilirubin: 0.4 mg/dL (ref 0.0–1.2)
Total Protein: 7.1 g/dL (ref 6.5–8.1)

## 2023-07-26 LAB — MAGNESIUM: Magnesium: 1.8 mg/dL (ref 1.7–2.4)

## 2023-07-26 NOTE — Progress Notes (Signed)
 PROGRESS NOTE  Paula Cooley GNF:[id number] DOB: 03/31/1990   PCP: Pcp, No  Patient is from: Home.  DOA: 07/21/2023 LOS: 4  Chief complaints Chief Complaint  Patient presents with   Abdominal Pain     Brief Narrative / Interim history: 33 year old F with PMH of multiple abdominal surgeries including open Meckel's diverticulum resection in 2020, and ex lap, LOA, SBR for SBO in 2023, and mood disorder presenting with abdominal pain with associated nausea and vomiting, and admitted with small bowel obstruction as noted on CT abdomen and pelvis.  General surgery consulted.  She had NG tube placed under fluoroscopy with immediate return of 4.5 L gastric contents.  Repeat KUB on 6/12 with normal bowel gas pattern and no obstruction.  NG tube output decreased significantly, and she tolerated NG tube clamping on 6/13.  She also had flatus and bowel movement.  NG tube removed and started on clear liquid diet on 6/13 and advance to soft diet on 6/14.   Subjective: Seen and examined earlier this morning.  Advance to soft diet yesterday.  She woke up with significant pain last night.  Pain improved this morning after pain medication.  Currently rated pain 3/4.  It was 6-7 overnight.  Reports nausea but no vomiting.  Reports bowel movement.  Objective: Vitals:   07/25/23 2009 07/26/23 0527 07/26/23 0721 07/26/23 1600  BP: 120/74 108/77 105/70 107/70  Pulse: 70 76 70 70  Resp: 18 18 18 18   Temp: 98.5 F (36.9 C) 98.3 F (36.8 C) 98.3 F (36.8 C) 98 F (36.7 C)  TempSrc: Oral Oral Oral Oral  SpO2: 98% 99% 99% 98%  Weight:      Height:        Examination:  GENERAL: No apparent distress.  Nontoxic. HEENT: MMM.  Vision and hearing grossly intact.  NECK: Supple.  No apparent JVD.  RESP:  No IWOB.  Fair aeration bilaterally. CVS:  RRR. Heart sounds normal.  ABD/GI/GU: BS+. Abd soft.  Mild tenderness over left abdomen. MSK/EXT:  Moves extremities. No apparent deformity. No edema.  SKIN: no  apparent skin lesion or wound NEURO: AA.  Oriented appropriately.  No apparent focal neuro deficit. PSYCH: Calm. Normal affect.   Consultants:  General surgery  Procedures: Fluoroscopic NG tube placement on 6/11  Microbiology summarized: None  Assessment and plan: Partial SBO with history of bowel resection & anastomosis and prior SBO for which she had ex lap, LOA and SBR. SBO seems to have resolved clinically and radiologically.  Advance to soft diet but woke up with significant pain overnight.  Abdominal x-ray with enteric contrast in descending colon and nonobstructive bowel gas pattern this morning.  Labs reassuring. -Appreciate help by general surgery-continue soft diet, analgesics, antiemetics -Continue PPI -Mobilize every 4 hours.   Hx of intermittent blood clot per rectum/BRBPR: Hemoccult negative. - Per previous attending, GI has advised outpt endoscopic evaluation after discussion over the phone.   Mood disorder -Continue home Celexa and Wellbutrin  Class I obesity Body mass index is 30.14 kg/m. - Encourage lifestyle change to lose weight.         DVT prophylaxis:  SCDs Start: 07/22/23 0225 Place TED hose Start: 07/22/23 0225  Code Status: Full code Family Communication: None at bedside Level of care: Med-Surg Status is: Inpatient Remains inpatient appropriate because: Due to SBO   Final disposition: Home   35 minutes with more than 50% spent in reviewing records, counseling patient/family and coordinating care.   Sch Meds:  Scheduled Meds:  buPROPion  300 mg Oral QPM   citalopram  20 mg Oral Daily   pantoprazole   40 mg Oral BID   sodium chloride  flush  3 mL Intravenous Q12H   Continuous Infusions: PRN Meds:.acetaminophen , dextrose , HYDROmorphone  (DILAUDID ) injection, LORazepam , morphine  injection, ondansetron  **OR** ondansetron  (ZOFRAN ) IV, oxyCODONE , phenol  Antimicrobials: Anti-infectives (From admission, onward)    None        I have  personally reviewed the following labs and images: CBC: Recent Labs  Lab 07/22/23 0612 07/23/23 0701 07/24/23 0551 07/25/23 0821 07/26/23 1256  WBC 8.8 8.2 10.7* 5.6 5.7  HGB 13.5 13.6 12.4 13.4 13.3  HCT 39.6 40.9 37.5 39.7 39.5  MCV 89.0 92.3 92.6 91.5 90.4  PLT 246 214 189 218 211   BMP &GFR Recent Labs  Lab 07/22/23 0612 07/23/23 0701 07/24/23 0551 07/25/23 0821 07/26/23 1256  NA 138 144 138 135 137  K 3.6 3.7 3.7 4.2 3.7  CL 102 103 104 99 104  CO2 25 23 21* 25 21*  GLUCOSE 78 70 64* 146* 118*  BUN 7 11 10 6 7   CREATININE 0.85 0.94 0.98 0.82 0.75  CALCIUM 9.3 9.2 8.7* 9.3 9.4  MG  --  1.7 1.8 1.8 1.8  PHOS  --  3.1 3.1 2.2* 2.8   Estimated Creatinine Clearance: 110.8 mL/min (by C-G formula based on SCr of 0.75 mg/dL). Liver & Pancreas: Recent Labs  Lab 07/21/23 2150 07/23/23 0701 07/24/23 0551 07/25/23 0821 07/26/23 1256  AST 19 16 18 18 20   ALT 19 15 15 16 15   ALKPHOS 61 55 63 55 52  BILITOT 0.7 1.0 1.0 0.8 0.4  PROT 7.7 6.6 6.5 7.2 7.1  ALBUMIN 4.5 3.8 3.7 4.0 3.9   Recent Labs  Lab 07/21/23 2150  LIPASE 33   No results for input(s): AMMONIA in the last 168 hours. Diabetic: No results for input(s): HGBA1C in the last 72 hours. Recent Labs  Lab 07/22/23 0405  GLUCAP 82   Cardiac Enzymes: No results for input(s): CKTOTAL, CKMB, CKMBINDEX, TROPONINI in the last 168 hours. No results for input(s): PROBNP in the last 8760 hours. Coagulation Profile: Recent Labs  Lab 07/23/23 0701  INR 1.1   Thyroid Function Tests: No results for input(s): TSH, T4TOTAL, FREET4, T3FREE, THYROIDAB in the last 72 hours. Lipid Profile: No results for input(s): CHOL, HDL, LDLCALC, TRIG, CHOLHDL, LDLDIRECT in the last 72 hours. Anemia Panel: No results for input(s): VITAMINB12, FOLATE, FERRITIN, TIBC, IRON, RETICCTPCT in the last 72 hours. Urine analysis:    Component Value Date/Time   COLORURINE YELLOW  07/21/2023 0052   APPEARANCEUR HAZY (A) 07/21/2023 0052   LABSPEC 1.026 07/21/2023 0052   PHURINE 6.0 07/21/2023 0052   GLUCOSEU NEGATIVE 07/21/2023 0052   HGBUR NEGATIVE 07/21/2023 0052   BILIRUBINUR NEGATIVE 07/21/2023 0052   BILIRUBINUR neg 09/06/2012 1355   KETONESUR 20 (A) 07/21/2023 0052   PROTEINUR 30 (A) 07/21/2023 0052   UROBILINOGEN negative 09/06/2012 1355   NITRITE NEGATIVE 07/21/2023 0052   LEUKOCYTESUR NEGATIVE 07/21/2023 0052   Sepsis Labs: Invalid input(s): PROCALCITONIN, LACTICIDVEN  Microbiology: No results found for this or any previous visit (from the past 240 hours).  Radiology Studies: DG Abd 2 Views Result Date: 07/26/2023 CLINICAL DATA:  [id number] Abdominal pain [id number] Small bowel obstruction [facility]) [id number] EXAM: ABDOMEN - 2 VIEW COMPARISON:  July 23, 2023 FINDINGS: Enteric contrast has progressed to the descending colon. Air and stool filled nondilated loops of bowel. IUD. Visualized lung  bases are unremarkable. No evidence of free air. IMPRESSION: Enteric contrast has progressed to the descending colon. Nonobstructive bowel gas pattern. Electronically Signed   By: Clancy Crimes M.D.   On: 07/26/2023 11:21      Clifford Benninger T. Aaliyah Gavel [facility]  If 7PM-7AM, please contact night-coverage [email or web address] 07/26/2023, 4:29 PM

## 2023-07-26 NOTE — Progress Notes (Signed)
 D/C IV due to occulusion, no blood returned noted and the IV wouldn't flush. Sent IV team consult.

## 2023-07-26 NOTE — Plan of Care (Signed)

## 2023-07-26 NOTE — Progress Notes (Signed)
    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 POD 3 was taken back for open LOA, reduction of internal hernia (2020). Underwent ex lap, LOA, SBR for SBO in 2023 - soft diet yesterday, slight nausea, no emesis, one loose BM - persistent left mid abdominal pain, no tenderness - AXR this AM - ordered - encouraged OOB, ambulation in halls   FEN - soft diet, IVF VTE - SCDs ID - None        Paula Billow, MD [facility] practice Office: [phone]        Chief Complaint: SBO, abdominal pain  Subjective: Patient in bed, comfortable.  Loose BM yesterday.  Tolerating soft diet.  Complains of left mid abdominal pain.  Objective: Vital signs in last 24 hours: Temp:  [98.2 F (36.8 C)-98.5 F (36.9 C)] 98.3 F (36.8 C) (06/15 0721) Pulse Rate:  [70-80] 70 (06/15 0721) Resp:  [18] 18 (06/15 0721) BP: (104-120)/(70-77) 105/70 (06/15 0721) SpO2:  [97 %-99 %] 99 % (06/15 0721) Last BM Date : 07/25/23  Intake/Output from previous day: 06/14 0701 - 06/15 0700 In: 1133 [P.O.:1130; I.V.:3] Out: -  Intake/Output this shift: No intake/output data recorded.  Physical Exam: HEENT - sclerae clear, mucous membranes moist Abdomen - soft without distension; non-tender; no mass  Lab Results:  Recent Labs    07/24/23 0551 07/25/23 0821  WBC 10.7* 5.6  HGB 12.4 13.4  HCT 37.5 39.7  PLT 189 218   BMET Recent Labs    07/24/23 0551 07/25/23 0821  NA 138 135  K 3.7 4.2  CL 104 99  CO2 21* 25  GLUCOSE 64* 146*  BUN 10 6  CREATININE 0.98 0.82  CALCIUM 8.7* 9.3   PT/INR No results for input(s): LABPROT, INR in the last 72 hours. Comprehensive Metabolic Panel:    Component Value Date/Time   NA 135 07/25/2023 0821   NA 138 07/24/2023 0551   NA 143 04/09/2023 0834   K 4.2 07/25/2023 0821   K 3.7 07/24/2023 0551   CL 99 07/25/2023 0821   CL 104 07/24/2023 0551   CO2 25 07/25/2023  0821   CO2 21 (L) 07/24/2023 0551   BUN 6 07/25/2023 0821   BUN 10 07/24/2023 0551   BUN 12 04/09/2023 0834   BUN 12 07/14/2022 0000   CREATININE 0.82 07/25/2023 0821   CREATININE 0.98 07/24/2023 0551   GLUCOSE 146 (H) 07/25/2023 0821   GLUCOSE 64 (L) 07/24/2023 0551   CALCIUM 9.3 07/25/2023 0821   CALCIUM 8.7 (L) 07/24/2023 0551   AST 18 07/25/2023 0821   AST 18 07/24/2023 0551   ALT 16 07/25/2023 0821   ALT 15 07/24/2023 0551   ALKPHOS 55 07/25/2023 0821   ALKPHOS 63 07/24/2023 0551   BILITOT 0.8 07/25/2023 0821   BILITOT 1.0 07/24/2023 0551   BILITOT <0.2 04/09/2023 0834   PROT 7.2 07/25/2023 0821   PROT 6.5 07/24/2023 0551   PROT 6.6 04/09/2023 0834   ALBUMIN 4.0 07/25/2023 0821   ALBUMIN 3.7 07/24/2023 0551   ALBUMIN 4.5 04/09/2023 0834    Studies/Results: No results found.    Paula Cooley 07/26/2023  Patient ID: Paula Cooley, female   DOB: 06-May-1990, 33 y.o.   MRN: [id number]

## 2023-07-26 NOTE — Plan of Care (Signed)

## 2023-07-27 ENCOUNTER — Other Ambulatory Visit (HOSPITAL_COMMUNITY): Payer: Self-pay

## 2023-07-27 DIAGNOSIS — K56609 Unspecified intestinal obstruction, unspecified as to partial versus complete obstruction: Secondary | ICD-10-CM | POA: Diagnosis not present

## 2023-07-27 DIAGNOSIS — R0602 Shortness of breath: Secondary | ICD-10-CM | POA: Diagnosis not present

## 2023-07-27 DIAGNOSIS — Z9049 Acquired absence of other specified parts of digestive tract: Secondary | ICD-10-CM | POA: Diagnosis not present

## 2023-07-27 DIAGNOSIS — K625 Hemorrhage of anus and rectum: Secondary | ICD-10-CM | POA: Diagnosis not present

## 2023-07-27 DIAGNOSIS — R7982 Elevated C-reactive protein (CRP): Secondary | ICD-10-CM | POA: Insufficient documentation

## 2023-07-27 MED ORDER — SENNOSIDES-DOCUSATE SODIUM 8.6-50 MG PO TABS: 1.0000 | ORAL_TABLET | Freq: Two times a day (BID) | ORAL | Status: AC | PRN

## 2023-07-27 MED ORDER — HYDROMORPHONE HCL 2 MG PO TABS
2.0000 mg | ORAL_TABLET | ORAL | Status: DC | PRN
  Administered 2023-07-27: 2 mg via ORAL
  Filled 2023-07-27: qty 1

## 2023-07-27 MED ORDER — HYDROMORPHONE HCL 1 MG/ML IJ SOLN: 0.5000 mg | INTRAMUSCULAR | Status: DC | PRN

## 2023-07-27 MED ORDER — SENNOSIDES-DOCUSATE SODIUM 8.6-50 MG PO TABS: 1.0000 | ORAL_TABLET | Freq: Two times a day (BID) | ORAL | Status: DC | PRN

## 2023-07-27 MED ORDER — POLYETHYLENE GLYCOL 3350 17 G PO PACK: 17.0000 g | PACK | Freq: Two times a day (BID) | ORAL | Status: DC | PRN

## 2023-07-27 MED ORDER — HYDROMORPHONE HCL 2 MG PO TABS
2.0000 mg | ORAL_TABLET | ORAL | 0 refills | Status: AC | PRN
  Filled 2023-07-27: qty 30, 5d supply, fill #0

## 2023-07-27 NOTE — Discharge Summary (Signed)
 Physician Discharge Summary  Paula Cooley NWG:[id number] DOB: 1990/05/31 DOA: 07/21/2023  PCP: Vicente Graham, No  Admit date: 07/21/2023 Discharge date: 07/27/23  Admitted From: Home Disposition: Home Recommendations for Outpatient Follow-up:  Encouraged to establish care with PCP and GI as soon as possible Outpatient follow-up with general surgery Check CBC, CMP, CRP and ESR at follow-up Please follow up on the following pending results: None  Home Health: No need identified Equipment/Devices: No need identified  Discharge Condition: Stable CODE STATUS: Full code   Hospital course 33 year old F with PMH of multiple abdominal surgeries including open Meckel's diverticulum resection in 2020, and ex lap, LOA, SBR for SBO in 2023, and mood disorder presenting with abdominal pain with associated nausea and vomiting, and admitted with small bowel obstruction as noted on CT abdomen and pelvis. General surgery consulted. She had NG tube placed under fluoroscopy with immediate return of 4.5 L gastric contents. Repeat KUB on 6/12 with normal bowel gas pattern and no obstruction. NG tube output decreased significantly, and she tolerated NG tube clamping on 6/13. She also had flatus and bowel movement. NG tube removed and started on clear liquid diet on 6/13 and advance to soft diet on 6/14 that she tolerated.  Repeat KUB on 6/16 with enteric contrast in descending colon and nonobstructive bowel gas pattern.  She is cleared for discharge by general surgery.  Encouraged to establish care with PCP and gastroenterology as soon as possible.  Previously seen at [facility] clinic by GI.  See individual problem list below for more.   Problems addressed during this hospitalization Partial SBO with history of bowel resection & anastomosis and prior SBO for which she had ex lap, LOA and SBR. SBO seems to have resolved clinically and radiologically.  Advance to soft diet that she continues to tolerate.  Abdominal x-ray  on 6/16 with enteric contrast in descending colon and nonobstructive bowel gas pattern this morning.  Labs reassuring.  Cleared for discharge by general surgery.  Encouraged to establish care with PCP and GI as soon as possible. -P.o. Dilaudid  for pain.  Bowel regimen   Hx of intermittent blood clot per rectum/BRBPR: Hemoccult negative. - Per previous attending, GI has advised outpt endoscopic evaluation after discussion over the phone. -Encouraged to establish care with GI as soon as possible.  Previously seen at [facility] clinic  Elevated CRP -Repeat CRP and ESR outpatient -GI follow-up   Mood disorder -Continue home Celexa and Wellbutrin   Class I obesity Body mass index is 30.14 kg/m. - Encourage lifestyle change to lose weight.           Time spent 35 minutes  Vital signs Vitals:   07/26/23 1600 07/26/23 2013 07/27/23 0515 07/27/23 0756  BP: 107/70 109/69 128/77 116/73  Pulse: 70 66 72 74  Temp: 98 F (36.7 C) 98 F (36.7 C) 98.7 F (37.1 C) 98.4 F (36.9 C)  Resp: 18 18 18 18   Height:      Weight:      SpO2: 98% 98% 97% 100%  TempSrc: Oral Oral Oral Oral  BMI (Calculated):         Discharge exam  GENERAL: No apparent distress.  Nontoxic. HEENT: MMM.  Vision and hearing grossly intact.  NECK: Supple.  No apparent JVD.  RESP:  No IWOB.  Fair aeration bilaterally. CVS:  RRR. Heart sounds normal.  ABD/GI/GU: BS+. Abd soft, NTND.  MSK/EXT:  Moves extremities. No apparent deformity. No edema.  SKIN: no apparent skin lesion or  wound NEURO: Awake and alert. Oriented appropriately.  No apparent focal neuro deficit. PSYCH: Calm. Normal affect.   Discharge Instructions Discharge Instructions     Diet general   Complete by: As directed    Discharge instructions   Complete by: As directed    It has been a pleasure taking care of you!  You were hospitalized due to bowel obstruction for which you have been treated with use nasogastric tube and bowel rest.  Your  bowel obstruction seems to have resolved.  Establish care with primary care doctor and gastroenterologist as soon as possible.   Note that Dilaudid  (hydromorphone ) can increase your risk of drowsiness, sedation, constipation, impaired judgment, respiratory depression that could potentially lead to death and impaired balance that could increase risk of fall.  Use a stool softener as needed for constipation.  We do not recommend driving, operating machinery or other activity that requires similar mental and physical engagement.     Take care,   Increase activity slowly   Complete by: As directed       Allergies as of 07/27/2023   No Known Allergies      Medication List     TAKE these medications    buPROPion 300 MG 24 hr tablet Commonly known as: WELLBUTRIN XL Take 300 mg by mouth every evening.   citalopram 20 MG tablet Commonly known as: CELEXA Take 20 mg by mouth daily.   FIBER-CAPS PO Take 4 capsules by mouth 2 (two) times daily.   HYDROmorphone  2 MG tablet Commonly known as: DILAUDID  Take 1 tablet (2 mg total) by mouth every 4 (four) hours as needed for up to 5 days for severe pain (pain score 7-10) or moderate pain (pain score 4-6).   Kyleena 19.5 MG IUD Generic drug: levonorgestrel 1 each by Intrauterine route once.   multivitamin with minerals Tabs tablet Take 1 tablet by mouth every evening.   ondansetron  4 MG tablet Commonly known as: ZOFRAN  Take 4 mg by mouth every 8 (eight) hours as needed.   PROBIOTIC PO Take 1 capsule by mouth every evening.   senna-docusate 8.6-50 MG tablet Commonly known as: Senokot-S Take 1-2 tablets by mouth 2 (two) times daily as needed for moderate constipation or mild constipation.   Xyzal Allergy 24HR 5 MG tablet Generic drug: levocetirizine Take 5 mg by mouth in the morning.        Consultations: General surgery  Procedures/Studies: NGT decompression for small bowel obstruction   DG Abd 2 Views Result Date:  07/26/2023 CLINICAL DATA:  [id number] Abdominal pain [id number] Small bowel obstruction (HCC) [id number] EXAM: ABDOMEN - 2 VIEW COMPARISON:  July 23, 2023 FINDINGS: Enteric contrast has progressed to the descending colon. Air and stool filled nondilated loops of bowel. IUD. Visualized lung bases are unremarkable. No evidence of free air. IMPRESSION: Enteric contrast has progressed to the descending colon. Nonobstructive bowel gas pattern. Electronically Signed   By: Clancy Crimes M.D.   On: 07/26/2023 11:21   DG Abd Portable 1V Result Date: 07/23/2023 EXAM: 1 VIEW XRAY OF THE ABDOMEN SUPINE 07/23/2023 06:00:29 AM COMPARISON: 1 view abdomen 07/22/2023. CLINICAL HISTORY: SBO (small bowel obstruction) (HCC) [id number]. SBO; ROVER FINDINGS: BOWEL: Minimal residual contrast is present with thin a normal-appearing stomach. Contrast is mostly within the colon, suggesting normal transit. PERITONEUM AND SOFT TISSUES: No abnormal calcifications. BONES: No acute osseous abnormality. IMPRESSION: 1. Normal bowel gas pattern. No obstruction. Electronically signed by: Audree Leas MD 07/23/2023 06:12 AM EDT RP Workstation: [id number]  DG Abd 1 View Result Date: 07/22/2023 CLINICAL DATA:  NG tube placement EXAM: ABDOMEN - 1 VIEW COMPARISON:  None Available. FINDINGS: The nasogastric tube tip is in the stomach. Contrast is again noted within the dilated stomach. Limited visualization of small bowel. IMPRESSION: Nasogastric tube tip in the stomach. Electronically Signed   By: Juanetta Nordmann M.D.   On: 07/22/2023 18:04   DG Abd Portable 1V Result Date: 07/22/2023 CLINICAL DATA:  Nausea. EXAM: PORTABLE ABDOMEN - 1 VIEW COMPARISON:  CT scan of same day. FINDINGS: There appears to be a large amount of contrast within the dilated stomach. Moderate small bowel dilatation is noted concerning for distal small bowel obstruction. No colonic dilatation is noted. Residual contrast is noted in urinary bladder. IMPRESSION: Large amount  of contrast is noted within dilated stomach. Moderate small bowel dilatation is noted concerning for distal small bowel obstruction. Electronically Signed   By: Rosalene Colon M.D.   On: 07/22/2023 13:37   CT ABDOMEN PELVIS W CONTRAST Result Date: 07/22/2023 CLINICAL DATA:  Periumbilical pain, abdominal EXAM: CT ABDOMEN AND PELVIS WITH CONTRAST TECHNIQUE: Multidetector CT imaging of the abdomen and pelvis was performed using the standard protocol following bolus administration of intravenous contrast. RADIATION DOSE REDUCTION: This exam was performed according to the departmental dose-optimization program which includes automated exposure control, adjustment of the mA and/or kV according to patient size and/or use of iterative reconstruction technique. CONTRAST:  75mL OMNIPAQUE  IOHEXOL  350 MG/ML SOLN COMPARISON:  04/09/2021 FINDINGS: Lower chest: No acute abnormality Hepatobiliary: No focal hepatic abnormality. Gallbladder unremarkable. Pancreas: No focal abnormality or ductal dilatation. Spleen: No focal abnormality.  Normal size. Adrenals/Urinary Tract: Stable small cyst in the lower pole of the left kidney. No additional follow-up imaging recommended. No stones or hydronephrosis. Adrenal glands and urinary bladder unremarkable. Stomach/Bowel: Stomach is decompressed. Mildly dilated small bowel loops into the pelvis with scattered air-fluid levels. Distal small bowel is decompressed. Postoperative changes in the distal small bowel. Moderate stool burden throughout the colon. Vascular/Lymphatic: No evidence of aneurysm or adenopathy. Reproductive: Uterus and adnexa unremarkable. No mass. IUD in the uterus. Other: Small to moderate free fluid in the pelvis.  No free air. Musculoskeletal: No acute bony abnormality. IMPRESSION: Dilated small bowel loops into the pelvis with scattered air-fluid levels. Postoperative changes in the distal small bowel which is decompressed. Findings concerning for early partial small  bowel obstruction. Electronically Signed   By: Janeece Mechanic M.D.   On: 07/22/2023 01:31       The results of significant diagnostics from this hospitalization (including imaging, microbiology, ancillary and laboratory) are listed below for reference.     Microbiology: No results found for this or any previous visit (from the past 240 hours).   Labs:  CBC: Recent Labs  Lab 07/22/23 0612 07/23/23 0701 07/24/23 0551 07/25/23 0821 07/26/23 1256  WBC 8.8 8.2 10.7* 5.6 5.7  HGB 13.5 13.6 12.4 13.4 13.3  HCT 39.6 40.9 37.5 39.7 39.5  MCV 89.0 92.3 92.6 91.5 90.4  PLT 246 214 189 218 211   BMP &GFR Recent Labs  Lab 07/22/23 0612 07/23/23 0701 07/24/23 0551 07/25/23 0821 07/26/23 1256  NA 138 144 138 135 137  K 3.6 3.7 3.7 4.2 3.7  CL 102 103 104 99 104  CO2 25 23 21* 25 21*  GLUCOSE 78 70 64* 146* 118*  BUN 7 11 10 6 7   CREATININE 0.85 0.94 0.98 0.82 0.75  CALCIUM 9.3 9.2 8.7* 9.3 9.4  MG  --  1.7 1.8 1.8 1.8  PHOS  --  3.1 3.1 2.2* 2.8   Estimated Creatinine Clearance: 110.8 mL/min (by C-G formula based on SCr of 0.75 mg/dL). Liver & Pancreas: Recent Labs  Lab 07/21/23 2150 07/23/23 0701 07/24/23 0551 07/25/23 0821 07/26/23 1256  AST 19 16 18 18 20   ALT 19 15 15 16 15   ALKPHOS 61 55 63 55 52  BILITOT 0.7 1.0 1.0 0.8 0.4  PROT 7.7 6.6 6.5 7.2 7.1  ALBUMIN 4.5 3.8 3.7 4.0 3.9   Recent Labs  Lab 07/21/23 2150  LIPASE 33   No results for input(s): AMMONIA in the last 168 hours. Diabetic: No results for input(s): HGBA1C in the last 72 hours. Recent Labs  Lab 07/22/23 0405  GLUCAP 82   Cardiac Enzymes: No results for input(s): CKTOTAL, CKMB, CKMBINDEX, TROPONINI in the last 168 hours. No results for input(s): PROBNP in the last 8760 hours. Coagulation Profile: Recent Labs  Lab 07/23/23 0701  INR 1.1   Thyroid Function Tests: No results for input(s): TSH, T4TOTAL, FREET4, T3FREE, THYROIDAB in the last 72 hours. Lipid  Profile: No results for input(s): CHOL, HDL, LDLCALC, TRIG, CHOLHDL, LDLDIRECT in the last 72 hours. Anemia Panel: No results for input(s): VITAMINB12, FOLATE, FERRITIN, TIBC, IRON, RETICCTPCT in the last 72 hours. Urine analysis:    Component Value Date/Time   COLORURINE YELLOW 07/21/2023 0052   APPEARANCEUR HAZY (A) 07/21/2023 0052   LABSPEC 1.026 07/21/2023 0052   PHURINE 6.0 07/21/2023 0052   GLUCOSEU NEGATIVE 07/21/2023 0052   HGBUR NEGATIVE 07/21/2023 0052   BILIRUBINUR NEGATIVE 07/21/2023 0052   BILIRUBINUR neg 09/06/2012 1355   KETONESUR 20 (A) 07/21/2023 0052   PROTEINUR 30 (A) 07/21/2023 0052   UROBILINOGEN negative 09/06/2012 1355   NITRITE NEGATIVE 07/21/2023 0052   LEUKOCYTESUR NEGATIVE 07/21/2023 0052   Sepsis Labs: Invalid input(s): PROCALCITONIN, LACTICIDVEN   SIGNED:  Dom Haverland T Shy Guallpa, MD  [facility] 07/27/2023, 12:35 PM

## 2023-07-27 NOTE — Progress Notes (Signed)
   Subjective/Chief Complaint: Having flatus tol diet   Objective: Vital signs in last 24 hours: Temp:  [98 F (36.7 C)-98.7 F (37.1 C)] 98.4 F (36.9 C) (06/16 0756) Pulse Rate:  [66-74] 74 (06/16 0756) Resp:  [18] 18 (06/16 0756) BP: (107-128)/(69-77) 116/73 (06/16 0756) SpO2:  [97 %-100 %] 100 % (06/16 0756) Last BM Date : 07/25/23  Intake/Output from previous day: 06/15 0701 - 06/16 0700 In: 243 [P.O.:240; I.V.:3] Out: -  Intake/Output this shift: No intake/output data recorded.  Ab soft nontender nondistended  Lab Results:  Recent Labs    07/25/23 0821 07/26/23 1256  WBC 5.6 5.7  HGB 13.4 13.3  HCT 39.7 39.5  PLT 218 211   BMET Recent Labs    07/25/23 0821 07/26/23 1256  NA 135 137  K 4.2 3.7  CL 99 104  CO2 25 21*  GLUCOSE 146* 118*  BUN 6 7  CREATININE 0.82 0.75  CALCIUM 9.3 9.4   PT/INR No results for input(s): LABPROT, INR in the last 72 hours. ABG No results for input(s): PHART, HCO3 in the last 72 hours.  Invalid input(s): PCO2, PO2  Studies/Results: DG Abd 2 Views Result Date: 07/26/2023 CLINICAL DATA:  [id number] Abdominal pain [id number] Small bowel obstruction [facility]) [id number] EXAM: ABDOMEN - 2 VIEW COMPARISON:  July 23, 2023 FINDINGS: Enteric contrast has progressed to the descending colon. Air and stool filled nondilated loops of bowel. IUD. Visualized lung bases are unremarkable. No evidence of free air. IMPRESSION: Enteric contrast has progressed to the descending colon. Nonobstructive bowel gas pattern. Electronically Signed   By: Clancy Crimes M.D.   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 POD 3 was taken back for open LOA, reduction of internal hernia (2020). Underwent ex lap, LOA, SBR for SBO in 2023 - soft diet tolerated, having flatus - persistent  left mid abdominal pain, no tenderness - she can go home today - she asked about elective surgery to prevent this and I dont think any role for that now. If she returns and has another soon or more frequent issues it can be addressed but dont think would do this now - encouraged OOB, ambulation in halls   FEN - soft diet, IVF VTE - SCDs ID - None  I reviewed hospitalist notes, last 24 h vitals and pain scores, last 48 h intake and output, last 24 h labs and trends, and last 24 h imaging results.   Enid Harry 07/27/2023

## 2023-07-27 NOTE — TOC Transition Note (Signed)
 Transition of Care [facility]) - Discharge Note   Patient Details  Name: Paula Cooley MRN: [id number] Date of Birth: 26-Oct-1990  [facility] CM/SW Contact:  Jannine Meo, RN Phone Number: 07/27/2023, 9:28 AM   Clinical Narrative:   Patient is being discharged today. Consult noted for PCP needs. Spoke with patient by phone, confirmed that she has adequate health insurance coverage and the ability to search for provider from [employer].    Final next level of care: Home/Self Care Barriers to Discharge: No Barriers Identified   Patient Goals and CMS Choice            Discharge Placement                       Discharge Plan and Services Additional resources added to the After Visit Summary for                                       Social Drivers of Health (SDOH) Interventions SDOH Screenings   Food Insecurity: No Food Insecurity (07/22/2023)  Housing: Low Risk  (07/22/2023)  Transportation Needs: No Transportation Needs (07/22/2023)  Utilities: Not At Risk (07/22/2023)  Depression (PHQ2-9): Low Risk  (11/28/2019)  Tobacco Use: Medium Risk (07/22/2023)     Readmission Risk Interventions     No data to display
